# Patient Record
Sex: Female | Born: 1950 | Race: White | Hispanic: No | Smoking: Former smoker
Health system: Southern US, Community
[De-identification: ages and names within clinical notes are randomized; demographics above are authoritative.]

## PROBLEM LIST (undated history)

## (undated) DIAGNOSIS — I639 Cerebral infarction, unspecified: Secondary | ICD-10-CM

## (undated) DIAGNOSIS — K635 Polyp of colon: Secondary | ICD-10-CM

## (undated) DIAGNOSIS — I6529 Occlusion and stenosis of unspecified carotid artery: Secondary | ICD-10-CM

## (undated) DIAGNOSIS — I272 Pulmonary hypertension, unspecified: Secondary | ICD-10-CM

## (undated) DIAGNOSIS — N186 End stage renal disease: Secondary | ICD-10-CM

## (undated) DIAGNOSIS — J439 Emphysema, unspecified: Secondary | ICD-10-CM

## (undated) DIAGNOSIS — R260 Ataxic gait: Secondary | ICD-10-CM

## (undated) DIAGNOSIS — Z950 Presence of cardiac pacemaker: Secondary | ICD-10-CM

## (undated) DIAGNOSIS — D638 Anemia in other chronic diseases classified elsewhere: Secondary | ICD-10-CM

## (undated) DIAGNOSIS — T07XXXA Unspecified multiple injuries, initial encounter: Secondary | ICD-10-CM

## (undated) DIAGNOSIS — H409 Unspecified glaucoma: Secondary | ICD-10-CM

## (undated) DIAGNOSIS — G43909 Migraine, unspecified, not intractable, without status migrainosus: Secondary | ICD-10-CM

## (undated) DIAGNOSIS — Z992 Dependence on renal dialysis: Secondary | ICD-10-CM

## (undated) DIAGNOSIS — K219 Gastro-esophageal reflux disease without esophagitis: Secondary | ICD-10-CM

## (undated) DIAGNOSIS — I739 Peripheral vascular disease, unspecified: Secondary | ICD-10-CM

## (undated) DIAGNOSIS — H3500 Unspecified background retinopathy: Secondary | ICD-10-CM

## (undated) DIAGNOSIS — F40240 Claustrophobia: Secondary | ICD-10-CM

## (undated) DIAGNOSIS — E118 Type 2 diabetes mellitus with unspecified complications: Secondary | ICD-10-CM

## (undated) DIAGNOSIS — Z87442 Personal history of urinary calculi: Secondary | ICD-10-CM

## (undated) DIAGNOSIS — I10 Essential (primary) hypertension: Secondary | ICD-10-CM

## (undated) DIAGNOSIS — E785 Hyperlipidemia, unspecified: Secondary | ICD-10-CM

## (undated) DIAGNOSIS — R0601 Orthopnea: Secondary | ICD-10-CM

## (undated) DIAGNOSIS — R52 Pain, unspecified: Secondary | ICD-10-CM

## (undated) DIAGNOSIS — R0902 Hypoxemia: Secondary | ICD-10-CM

## (undated) DIAGNOSIS — I2510 Atherosclerotic heart disease of native coronary artery without angina pectoris: Secondary | ICD-10-CM

## (undated) HISTORY — DX: Cerebral infarction, unspecified: I63.9

## (undated) HISTORY — PX: AV FISTULA PLACEMENT: SHX1204

## (undated) HISTORY — DX: Essential (primary) hypertension: I10

## (undated) HISTORY — PX: ABDOMINAL HYSTERECTOMY: SHX81

## (undated) HISTORY — PX: TUBAL LIGATION: SHX77

## (undated) HISTORY — PX: OTHER SURGICAL HISTORY: SHX169

## (undated) HISTORY — PX: CHOLECYSTECTOMY: SHX55

## (undated) HISTORY — PX: PACEMAKER IMPLANT: EP1218

## (undated) HISTORY — DX: Migraine, unspecified, not intractable, without status migrainosus: G43.909

## (undated) HISTORY — PX: INSERT / REPLACE / REMOVE PACEMAKER: SUR710

## (undated) HISTORY — DX: Polyp of colon: K63.5

## (undated) HISTORY — DX: Hyperlipidemia, unspecified: E78.5

## (undated) HISTORY — DX: Emphysema, unspecified: J43.9

## (undated) HISTORY — PX: TONSILLECTOMY: SUR1361

## (undated) HISTORY — DX: Occlusion and stenosis of unspecified carotid artery: I65.29

## (undated) HISTORY — PX: CORONARY ARTERY BYPASS GRAFT: SHX141

## (undated) HISTORY — DX: Gastro-esophageal reflux disease without esophagitis: K21.9

---

## 2014-03-17 DIAGNOSIS — E1122 Type 2 diabetes mellitus with diabetic chronic kidney disease: Secondary | ICD-10-CM | POA: Insufficient documentation

## 2014-03-17 DIAGNOSIS — I10 Essential (primary) hypertension: Secondary | ICD-10-CM | POA: Insufficient documentation

## 2014-03-17 DIAGNOSIS — N184 Chronic kidney disease, stage 4 (severe): Secondary | ICD-10-CM | POA: Insufficient documentation

## 2014-03-17 DIAGNOSIS — N186 End stage renal disease: Secondary | ICD-10-CM

## 2014-07-12 DIAGNOSIS — E119 Type 2 diabetes mellitus without complications: Secondary | ICD-10-CM | POA: Insufficient documentation

## 2014-07-12 DIAGNOSIS — R0602 Shortness of breath: Secondary | ICD-10-CM | POA: Insufficient documentation

## 2014-07-12 DIAGNOSIS — R079 Chest pain, unspecified: Secondary | ICD-10-CM | POA: Insufficient documentation

## 2014-07-12 DIAGNOSIS — N289 Disorder of kidney and ureter, unspecified: Secondary | ICD-10-CM | POA: Insufficient documentation

## 2014-09-02 DIAGNOSIS — I10 Essential (primary) hypertension: Secondary | ICD-10-CM | POA: Insufficient documentation

## 2014-09-03 DIAGNOSIS — D638 Anemia in other chronic diseases classified elsewhere: Secondary | ICD-10-CM | POA: Insufficient documentation

## 2014-09-03 DIAGNOSIS — N186 End stage renal disease: Secondary | ICD-10-CM | POA: Insufficient documentation

## 2014-09-03 DIAGNOSIS — Z992 Dependence on renal dialysis: Principal | ICD-10-CM

## 2014-11-26 DIAGNOSIS — I6529 Occlusion and stenosis of unspecified carotid artery: Secondary | ICD-10-CM | POA: Insufficient documentation

## 2014-11-26 DIAGNOSIS — N186 End stage renal disease: Secondary | ICD-10-CM | POA: Insufficient documentation

## 2015-04-01 LAB — HM COLONOSCOPY

## 2015-06-24 LAB — HEMOGLOBIN A1C: Hemoglobin A1C: 8.3

## 2015-08-15 LAB — LIPID PANEL
Cholesterol: 188 mg/dL (ref 0–200)
HDL: 36 mg/dL (ref 35–70)
LDL Cholesterol: 84 mg/dL
Triglycerides: 340 mg/dL — AB (ref 40–160)

## 2015-08-29 DIAGNOSIS — I2510 Atherosclerotic heart disease of native coronary artery without angina pectoris: Secondary | ICD-10-CM | POA: Insufficient documentation

## 2015-09-09 DIAGNOSIS — I639 Cerebral infarction, unspecified: Secondary | ICD-10-CM

## 2015-09-09 HISTORY — DX: Cerebral infarction, unspecified: I63.9

## 2015-10-30 ENCOUNTER — Ambulatory Visit (INDEPENDENT_AMBULATORY_CARE_PROVIDER_SITE_OTHER): Payer: Medicaid Other | Admitting: Family Medicine

## 2015-10-30 ENCOUNTER — Encounter: Payer: Self-pay | Admitting: Family Medicine

## 2015-10-30 VITALS — BP 180/82 | HR 82 | Temp 98.7°F | Resp 16 | Ht 61.0 in | Wt 145.8 lb

## 2015-10-30 DIAGNOSIS — N185 Chronic kidney disease, stage 5: Secondary | ICD-10-CM

## 2015-10-30 DIAGNOSIS — Z992 Dependence on renal dialysis: Secondary | ICD-10-CM

## 2015-10-30 DIAGNOSIS — Z794 Long term (current) use of insulin: Secondary | ICD-10-CM

## 2015-10-30 DIAGNOSIS — I6930 Unspecified sequelae of cerebral infarction: Secondary | ICD-10-CM | POA: Diagnosis not present

## 2015-10-30 DIAGNOSIS — E785 Hyperlipidemia, unspecified: Secondary | ICD-10-CM

## 2015-10-30 DIAGNOSIS — E1122 Type 2 diabetes mellitus with diabetic chronic kidney disease: Secondary | ICD-10-CM

## 2015-10-30 DIAGNOSIS — I120 Hypertensive chronic kidney disease with stage 5 chronic kidney disease or end stage renal disease: Secondary | ICD-10-CM | POA: Diagnosis not present

## 2015-10-30 DIAGNOSIS — N186 End stage renal disease: Secondary | ICD-10-CM | POA: Insufficient documentation

## 2015-10-30 DIAGNOSIS — D638 Anemia in other chronic diseases classified elsewhere: Secondary | ICD-10-CM | POA: Diagnosis not present

## 2015-10-30 DIAGNOSIS — I2510 Atherosclerotic heart disease of native coronary artery without angina pectoris: Secondary | ICD-10-CM | POA: Diagnosis not present

## 2015-10-30 DIAGNOSIS — I6521 Occlusion and stenosis of right carotid artery: Secondary | ICD-10-CM | POA: Diagnosis not present

## 2015-10-30 DIAGNOSIS — I639 Cerebral infarction, unspecified: Secondary | ICD-10-CM

## 2015-10-30 DIAGNOSIS — I5022 Chronic systolic (congestive) heart failure: Secondary | ICD-10-CM

## 2015-10-30 DIAGNOSIS — I509 Heart failure, unspecified: Secondary | ICD-10-CM | POA: Insufficient documentation

## 2015-10-30 MED ORDER — BLOOD PRESSURE CUFF MISC: 1.0000 | Freq: Every day | Status: DC

## 2015-10-30 MED ORDER — LISINOPRIL 10 MG PO TABS: 10.0000 mg | ORAL_TABLET | Freq: Every day | ORAL | Status: DC

## 2015-10-30 MED ORDER — INSULIN GLARGINE 100 UNIT/ML SOLOSTAR PEN: 26.0000 [IU] | PEN_INJECTOR | Freq: Every day | SUBCUTANEOUS | Status: DC

## 2015-10-30 MED ORDER — ATORVASTATIN CALCIUM 20 MG PO TABS: 20.0000 mg | ORAL_TABLET | Freq: Every day | ORAL | Status: DC

## 2015-10-30 MED ORDER — CARVEDILOL 12.5 MG PO TABS: 12.5000 mg | ORAL_TABLET | Freq: Two times a day (BID) | ORAL | Status: DC

## 2015-10-30 MED ORDER — SEVELAMER CARBONATE 800 MG PO TABS: 1600.0000 mg | ORAL_TABLET | Freq: Three times a day (TID) | ORAL | Status: DC

## 2015-10-30 MED ORDER — AMLODIPINE BESYLATE 5 MG PO TABS: 5.0000 mg | ORAL_TABLET | Freq: Every day | ORAL | Status: DC

## 2015-10-30 NOTE — Progress Notes (Signed)
Subjective:    Patient ID: Nichole Hurst, female    DOB: 01/22/1951, 64 y.o.   MRN: [id number]  HPI: Nichole Hurst is a 64 y.o. female presenting on 10/30/2015 for Establish Care   HPI  Pt presents to establish care today. Previous care provider was [facility] in [place].  It has been a while  since Her last PCP visit. She sees both a Renal and Cardiac Specialist. Records from previous provider will be requested and reviewed. Current medical problems include:  Stroke in November: Treated at [facility] Has not followed up with neurology. R sided weakness. Has completed PT. No issues swallowing. R facial droop. Stroke was caused by blood in the brain. On Statin for secondary prevention. Kidney disease: on HD MWF. AV fistula. Kidney specialist- Dr. Cherylann Ratel? - gets [facility]. ESRD 2/2 diabetes and HTN.  High blood pressure: Diagnosed >10 years ago. Taking lisinopril and amlodipine for BP. Coreg BID. Pt has not taken medications for BP this AM. Does not check BP at home. Denies CP, SOB, visual changes, or HA Diabetes: Last A1c in August was 8.3% per care everywhere. Pt thinks her  Kidney specialist was monitoring her diabetes.  Taking 26 units of lantus at bedtime.  [facility] managed CHF. Unsure last EF. Taking coreg daily. Volume managed by nephrology Sees provider on 1/2 Iron Deficiency anemia- taking iron daily. 2/2chronic disease as well. Labs are drawn at dialysis.   Pt is poor historian. Unsure who is managing her chronic conditions.   Health maintenance:  Last May 2016- colon polyps. Due in 5 years.  Lipid panel- 08/2015 Unsure last pap.  Unsure last mammo.    Past Medical History  Diagnosis Date  . Diabetes mellitus without complication (HCC)   . Hypertension   . Renal failure   . Hyperlipidemia   . Anemia   . Colon polyp   . GERD (gastroesophageal reflux disease)   . Emphysema/COPD (HCC)   . Migraine   . Stroke (HCC) 09/2015    Ischemic stroke with R  sided hemiparesis  . Carotid stenosis   . CHF (congestive heart failure) [facility])    Past Surgical History  Procedure Laterality Date  . Cholecystectomy    . Tonsilectomy, adenoidectomy, bilateral myringotomy and tubes      Social History   Social History  . Marital Status: Widowed    Spouse Name: N/A  . Number of Children: N/A  . Years of Education: N/A   Occupational History  . Not on file.   Social History Main Topics  . Smoking status: Former Games developer  . Smokeless tobacco: Former Neurosurgeon  . Alcohol Use: No  . Drug Use: No  . Sexual Activity: Not on file   Other Topics Concern  . Not on file   Social History Narrative  . No narrative on file   Family History  Problem Relation Age of Onset  . Diabetes Mother    No current outpatient prescriptions on file prior to visit.   No current facility-administered medications on file prior to visit.    Review of Systems  Constitutional: Negative for fever and chills.  HENT: Negative.   Respiratory: Negative for cough, chest tightness and wheezing.   Cardiovascular: Negative for chest pain and leg swelling.  Gastrointestinal: Negative for nausea, vomiting, abdominal pain, diarrhea and constipation.  Endocrine: Negative.  Negative for cold intolerance, heat intolerance, polydipsia, polyphagia and polyuria.  Genitourinary: Negative for dysuria and difficulty urinating.  Musculoskeletal: Negative.   Neurological: Positive  for facial asymmetry (r side residual from stroke. ) and weakness (R side residual from stroke). Negative for dizziness, light-headedness and numbness.  Psychiatric/Behavioral: Negative.    Per HPI unless specifically indicated above     Objective:    BP 180/82 mmHg  Pulse 82  Temp(Src) 98.7 F (37.1 C) (Oral)  Resp 16  Ht 5\' 1"  (1.549 m)  Wt 145 lb 12.8 oz (66.134 kg)  BMI 27.56 kg/m2  Wt Readings from Last 3 Encounters:  10/30/15 145 lb 12.8 oz (66.134 kg)    Physical Exam  Constitutional: She  is oriented to person, place, and time. She appears well-developed and well-nourished.  HENT:  Head: Normocephalic and atraumatic.  Neck: Neck supple.  Cardiovascular: Normal rate and regular rhythm.  Exam reveals no gallop and no friction rub.   Murmur heard.  Systolic murmur is present with a grade of 2/6  2/6 murmur heard best RSB- does not radiate.   Pulmonary/Chest: Effort normal and breath sounds normal. She has no wheezes. She exhibits no tenderness.  Abdominal: Soft. Normal appearance and bowel sounds are normal. She exhibits no distension and no mass. There is no tenderness. There is no rebound and no guarding.  Musculoskeletal: Normal range of motion. She exhibits no edema or tenderness.  Lymphadenopathy:    She has no cervical adenopathy.  Neurological: She is alert and oriented to person, place, and time. She has normal reflexes. No cranial nerve deficit or sensory deficit. She displays a negative Romberg sign.  R sided strength 4/5 2/2 stroke. Facial assymetry R side 2/2 stroke  Skin: Skin is warm and dry.   Results for orders placed or performed in visit on 10/30/15  Lipid panel  Result Value Ref Range   Triglycerides 340 (A) 40 - 160 mg/dL   Cholesterol 161 0 - 096 mg/dL   HDL 36 35 - 70 mg/dL   LDL Cholesterol 84 mg/dL  Hemoglobin E4V  Result Value Ref Range   Hemoglobin A1C 8.3   HM COLONOSCOPY  Result Value Ref Range   HM Colonoscopy polyps       Assessment & Plan:   Problem List Items Addressed This Visit      Cardiovascular and Mediastinum   Carotid artery narrowing    Found on carotid duplex post stroke. 60% stenosis in  R internal carotid. Will hold off on vascular referral at this time pending neuro consult.  Plan for carotid dopplers in 6 mos.       Relevant Medications   aspirin EC 81 MG tablet   lisinopril (PRINIVIL,ZESTRIL) 10 MG tablet   amLODipine (NORVASC) 5 MG tablet   atorvastatin (LIPITOR) 20 MG tablet   carvedilol (COREG) 12.5 MG tablet    CAD in native artery    S/P PCI in October 2016. Managed by [facility] cardiology.       Relevant Medications   aspirin EC 81 MG tablet   lisinopril (PRINIVIL,ZESTRIL) 10 MG tablet   amLODipine (NORVASC) 5 MG tablet   atorvastatin (LIPITOR) 20 MG tablet   carvedilol (COREG) 12.5 MG tablet   BP (high blood pressure)    BP very elevated. Pt reports she is not taken BP meds today. Recheck after medication taken. Pt will call tomorrow if BP is still elevated in dialysis.  Consider increasing amlodipine to control BP.  Pt encouraged to check BP at home. Prescription for BP cuff given.        Relevant Medications   aspirin EC 81 MG tablet  lisinopril (PRINIVIL,ZESTRIL) 10 MG tablet   amLODipine (NORVASC) 5 MG tablet   atorvastatin (LIPITOR) 20 MG tablet   carvedilol (COREG) 12.5 MG tablet   Blood Pressure Monitoring (BLOOD PRESSURE CUFF) MISC   CHF (congestive heart failure) (HCC)    Managed by [facility] cardiology. Coreg, statin, and aspirin being taken. Last EF 55%.       Relevant Medications   aspirin EC 81 MG tablet   lisinopril (PRINIVIL,ZESTRIL) 10 MG tablet   amLODipine (NORVASC) 5 MG tablet   atorvastatin (LIPITOR) 20 MG tablet   carvedilol (COREG) 12.5 MG tablet     Endocrine   Type 2 diabetes mellitus with end-stage renal disease (HCC)    Ordered A1c for dialysis to complete. Pt only taking lantus at bedtime. Last documented A1c was 8.3%.  Unsure of eye exam. Anuric- so no microalbumin.  Foot exam deferred for next visit.       Relevant Medications   aspirin EC 81 MG tablet   lisinopril (PRINIVIL,ZESTRIL) 10 MG tablet   atorvastatin (LIPITOR) 20 MG tablet   Insulin Glargine (LANTUS SOLOSTAR) 100 UNIT/ML Solostar Pen     Genitourinary   End stage renal failure on dialysis (HCC) - Primary    HD MWF. Managed by nephrology. Requesting labs from her dialysis center to monitor Cr and GFR.      Relevant Medications   sevelamer carbonate (RENVELA) 800 MG tablet     Other     Anemia of chronic disease    Requesting labs from dialysis to monitor anemia.  Continue iron.       Relevant Medications   ferrous sulfate 325 (65 FE) MG tablet   Ferrous Sulfate 140 (45 FE) MG TBCR   History of stroke with residual deficit    Refer to neurology for follow-up. Pt is completing rehab. Statin and aspirin for secondary prevention.  .       Hyperlipidemia    Continue statin.       Relevant Medications   aspirin EC 81 MG tablet   lisinopril (PRINIVIL,ZESTRIL) 10 MG tablet   amLODipine (NORVASC) 5 MG tablet   atorvastatin (LIPITOR) 20 MG tablet   carvedilol (COREG) 12.5 MG tablet      Meds ordered this encounter  Medications  . calcium acetate (PHOSLO) 667 MG capsule    Sig: Take 1,334 mg by mouth.  . ferrous sulfate 325 (65 FE) MG tablet    Sig: Take 325 mg by mouth.  . DISCONTD: Insulin Glargine (LANTUS SOLOSTAR) 100 UNIT/ML Solostar Pen    Sig: Inject into the skin.  Marland Kitchen acetaminophen (TYLENOL) 325 MG tablet    Sig: Take by mouth.  Marland Kitchen albuterol-ipratropium (COMBIVENT) 18-103 MCG/ACT inhaler    Sig: Inhale into the lungs.  Marland Kitchen DISCONTD: amLODipine (NORVASC) 5 MG tablet    Sig: Take by mouth.  Marland Kitchen aspirin EC 81 MG tablet    Sig: Take by mouth.  . DISCONTD: atorvastatin (LIPITOR) 20 MG tablet    Sig: Take by mouth.  . DISCONTD: carvedilol (COREG) 12.5 MG tablet    Sig: Take by mouth.  . Ferrous Sulfate 140 (45 FE) MG TBCR    Sig: 140 mg daily.   Marland Kitchen DISCONTD: insulin lispro (HUMALOG) 100 UNIT/ML injection    Sig: Inject into the skin.  Marland Kitchen DISCONTD: insulin lispro (HUMALOG) 100 UNIT/ML injection    Sig: Inject into the skin.  Marland Kitchen DISCONTD: lisinopril (PRINIVIL,ZESTRIL) 10 MG tablet    Sig: Take by mouth.  . pantoprazole (PROTONIX)  40 MG tablet    Sig: Take by mouth.  . DISCONTD: sevelamer carbonate (RENVELA) 800 MG tablet    Sig: Take by mouth.  Marland Kitchen. lisinopril (PRINIVIL,ZESTRIL) 10 MG tablet    Sig: Take 1 tablet (10 mg total) by mouth daily.    Dispense:   90 tablet    Refill:  3    Order Specific Question:  Supervising Provider    Answer:  Janeann ForehandHAWKINS JR, JAMES H [phone][[id number]]  . amLODipine (NORVASC) 5 MG tablet    Sig: Take 1 tablet (5 mg total) by mouth daily.    Dispense:  90 tablet    Refill:  3    Order Specific Question:  Supervising Provider    Answer:  Janeann ForehandHAWKINS JR, JAMES H [phone][[id number]]  . atorvastatin (LIPITOR) 20 MG tablet    Sig: Take 1 tablet (20 mg total) by mouth daily at 6 PM.    Dispense:  90 tablet    Refill:  3    Order Specific Question:  Supervising Provider    Answer:  Janeann ForehandHAWKINS JR, JAMES H [phone][[id number]]  . Insulin Glargine (LANTUS SOLOSTAR) 100 UNIT/ML Solostar Pen    Sig: Inject 26 Units into the skin at bedtime.    Dispense:  15 mL    Refill:  11    Order Specific Question:  Supervising Provider    Answer:  Janeann ForehandHAWKINS JR, JAMES H [phone][[id number]]  . sevelamer carbonate (RENVELA) 800 MG tablet    Sig: Take 2 tablets (1,600 mg total) by mouth 3 (three) times daily with meals.    Dispense:  270 tablet    Refill:  3    Order Specific Question:  Supervising Provider    Answer:  Janeann ForehandHAWKINS JR, JAMES H [phone][[id number]]  . carvedilol (COREG) 12.5 MG tablet    Sig: Take 1 tablet (12.5 mg total) by mouth 2 (two) times daily with a meal.    Dispense:  180 tablet    Refill:  3    Order Specific Question:  Supervising Provider    Answer:  Janeann ForehandHAWKINS JR, JAMES H [phone][[id number]]  . Blood Pressure Monitoring (BLOOD PRESSURE CUFF) MISC    Sig: 1 each by Does not apply route daily.    Dispense:  1 each    Refill:  0    Order Specific Question:  Supervising Provider    Answer:  Janeann ForehandHAWKINS JR, JAMES H [[phone]]      Follow up plan: Return in about 1 week (around 11/06/2015) for BP check.

## 2015-10-30 NOTE — Patient Instructions (Signed)
Please seek immediate medical attention at ER or Urgent Care if you develop: Chest pain, pressure or tightness. Shortness of breath accompanied by nausea or diaphoresis Visual changes Numbness or tingling on one side of the body Facial droop Altered mental status Or any concerning symptoms.   Your goal blood pressure is 140/90. Work on low salt/sodium diet - goal <2.5gm (2,500mg ) per day. Eat a diet high in fruits/vegetables and whole grains.  Look into mediterranean and DASH diet. Goal activity is 16950min/wk of moderate intensity exercise.  This can be split into 30 minute chunks.  If you are not at this level, you can start with smaller 10-15 min increments and slowly build up activity. Look at www.heart.org for more resources   Please call me if you BP remains elevated tomorrow. We need to ensure it's going back down.

## 2015-10-30 NOTE — Assessment & Plan Note (Signed)
Found on carotid duplex post stroke. 60% stenosis in  R internal carotid. Will hold off on vascular referral at this time pending neuro consult.  Plan for carotid dopplers in 6 mos.

## 2015-10-30 NOTE — Assessment & Plan Note (Signed)
Managed by [facility] cardiology. Coreg, statin, and aspirin being taken. Last EF 55%.

## 2015-10-30 NOTE — Assessment & Plan Note (Addendum)
Refer to neurology for follow-up. Pt is completing rehab. Statin and aspirin for secondary prevention.  .Marland Kitchen

## 2015-10-30 NOTE — Assessment & Plan Note (Signed)
Continue statin. 

## 2015-10-30 NOTE — Assessment & Plan Note (Signed)
BP very elevated. Pt reports she is not taken BP meds today. Recheck after medication taken. Pt will call tomorrow if BP is still elevated in dialysis.  Consider increasing amlodipine to control BP.  Pt encouraged to check BP at home. Prescription for BP cuff given.

## 2015-10-30 NOTE — Assessment & Plan Note (Signed)
S/P PCI in October 2016. [facility].

## 2015-10-30 NOTE — Assessment & Plan Note (Signed)
HD MWF. Managed by nephrology. Requesting labs from her dialysis center to monitor Cr and GFR.

## 2015-10-30 NOTE — Assessment & Plan Note (Signed)
Ordered A1c for dialysis to complete. Pt only taking lantus at bedtime. Last documented A1c was 8.3%.  Unsure of eye exam. Anuric- so no microalbumin.  Foot exam deferred for next visit.

## 2015-10-30 NOTE — Assessment & Plan Note (Signed)
Requesting labs from dialysis to monitor anemia.  Continue iron.

## 2015-11-06 ENCOUNTER — Encounter: Payer: Self-pay | Admitting: *Deleted

## 2015-12-30 ENCOUNTER — Other Ambulatory Visit: Payer: Self-pay | Admitting: Family

## 2015-12-30 DIAGNOSIS — M5489 Other dorsalgia: Secondary | ICD-10-CM

## 2016-01-01 ENCOUNTER — Ambulatory Visit
Admission: RE | Admit: 2016-01-01 | Discharge: 2016-01-01 | Disposition: A | Discharge status: Home / Self Care | Payer: Medicare (Managed Care) | Source: Ambulatory Visit | Attending: Family | Admitting: Family

## 2016-01-01 DIAGNOSIS — M549 Dorsalgia, unspecified: Secondary | ICD-10-CM | POA: Diagnosis not present

## 2016-01-01 DIAGNOSIS — K5730 Diverticulosis of large intestine without perforation or abscess without bleeding: Secondary | ICD-10-CM | POA: Diagnosis not present

## 2016-01-01 DIAGNOSIS — R319 Hematuria, unspecified: Secondary | ICD-10-CM | POA: Diagnosis not present

## 2016-01-01 DIAGNOSIS — Z87442 Personal history of urinary calculi: Secondary | ICD-10-CM | POA: Insufficient documentation

## 2016-01-01 DIAGNOSIS — I700 Atherosclerosis of aorta: Secondary | ICD-10-CM | POA: Insufficient documentation

## 2016-01-01 DIAGNOSIS — M5489 Other dorsalgia: Secondary | ICD-10-CM | POA: Diagnosis present

## 2016-02-14 ENCOUNTER — Encounter: Payer: Self-pay | Admitting: *Deleted

## 2016-02-14 ENCOUNTER — Emergency Department
Admission: EM | Admit: 2016-02-14 | Discharge: 2016-02-15 | Disposition: A | Discharge status: Home / Self Care | Payer: Medicare (Managed Care) | Attending: Emergency Medicine | Admitting: Emergency Medicine

## 2016-02-14 DIAGNOSIS — S46912A Strain of unspecified muscle, fascia and tendon at shoulder and upper arm level, left arm, initial encounter: Secondary | ICD-10-CM

## 2016-02-14 DIAGNOSIS — E119 Type 2 diabetes mellitus without complications: Secondary | ICD-10-CM | POA: Diagnosis not present

## 2016-02-14 DIAGNOSIS — Y999 Unspecified external cause status: Secondary | ICD-10-CM | POA: Diagnosis not present

## 2016-02-14 DIAGNOSIS — S46812A Strain of other muscles, fascia and tendons at shoulder and upper arm level, left arm, initial encounter: Secondary | ICD-10-CM | POA: Diagnosis not present

## 2016-02-14 DIAGNOSIS — S4992XA Unspecified injury of left shoulder and upper arm, initial encounter: Secondary | ICD-10-CM | POA: Diagnosis present

## 2016-02-14 DIAGNOSIS — Z8673 Personal history of transient ischemic attack (TIA), and cerebral infarction without residual deficits: Secondary | ICD-10-CM | POA: Diagnosis not present

## 2016-02-14 DIAGNOSIS — I110 Hypertensive heart disease with heart failure: Secondary | ICD-10-CM | POA: Diagnosis not present

## 2016-02-14 DIAGNOSIS — Z79899 Other long term (current) drug therapy: Secondary | ICD-10-CM | POA: Insufficient documentation

## 2016-02-14 DIAGNOSIS — Y929 Unspecified place or not applicable: Secondary | ICD-10-CM | POA: Diagnosis not present

## 2016-02-14 DIAGNOSIS — M25512 Pain in left shoulder: Secondary | ICD-10-CM

## 2016-02-14 DIAGNOSIS — E785 Hyperlipidemia, unspecified: Secondary | ICD-10-CM | POA: Insufficient documentation

## 2016-02-14 DIAGNOSIS — Y939 Activity, unspecified: Secondary | ICD-10-CM | POA: Insufficient documentation

## 2016-02-14 DIAGNOSIS — X58XXXA Exposure to other specified factors, initial encounter: Secondary | ICD-10-CM | POA: Insufficient documentation

## 2016-02-14 DIAGNOSIS — Z87891 Personal history of nicotine dependence: Secondary | ICD-10-CM | POA: Insufficient documentation

## 2016-02-14 DIAGNOSIS — Z794 Long term (current) use of insulin: Secondary | ICD-10-CM | POA: Diagnosis not present

## 2016-02-14 DIAGNOSIS — I509 Heart failure, unspecified: Secondary | ICD-10-CM | POA: Insufficient documentation

## 2016-02-14 DIAGNOSIS — Z7982 Long term (current) use of aspirin: Secondary | ICD-10-CM | POA: Diagnosis not present

## 2016-02-14 LAB — BASIC METABOLIC PANEL
ANION GAP: 16 — AB (ref 5–15)
BUN: 35 mg/dL — ABNORMAL HIGH (ref 6–20)
CALCIUM: 9.1 mg/dL (ref 8.9–10.3)
CO2: 25 mmol/L (ref 22–32)
Chloride: 91 mmol/L — ABNORMAL LOW (ref 101–111)
Creatinine, Ser: 7.36 mg/dL — ABNORMAL HIGH (ref 0.44–1.00)
GFR, EST AFRICAN AMERICAN: 6 mL/min — AB (ref >60)
GFR, EST NON AFRICAN AMERICAN: 5 mL/min — AB (ref >60)
GLUCOSE: 227 mg/dL — AB (ref 65–99)
POTASSIUM: 4.1 mmol/L (ref 3.5–5.1)
SODIUM: 132 mmol/L — AB (ref 135–145)

## 2016-02-14 LAB — TROPONIN I

## 2016-02-14 LAB — CBC
HEMATOCRIT: 35.7 % (ref 35.0–47.0)
HEMOGLOBIN: 11.7 g/dL — AB (ref 12.0–16.0)
MCH: 30.1 pg (ref 26.0–34.0)
MCHC: 32.7 g/dL (ref 32.0–36.0)
MCV: 92 fL (ref 80.0–100.0)
Platelets: 293 10*3/uL (ref 150–440)
RBC: 3.88 MIL/uL (ref 3.80–5.20)
RDW: 14 % (ref 11.5–14.5)
WBC: 11.8 10*3/uL — AB (ref 3.6–11.0)

## 2016-02-14 NOTE — ED Notes (Signed)
Pt c/o L shoulder pain radiating to neck that started on past Wed early in morning, woke her from sleep. Pt states worsening pain and is very uncomfortable in triage. Pt denies dyspnea. Pt denies pain reproducible.

## 2016-02-15 ENCOUNTER — Emergency Department: Payer: Medicare (Managed Care)

## 2016-02-15 MED ORDER — HYDROCODONE-ACETAMINOPHEN 5-325 MG PO TABS: 1.0000 | ORAL_TABLET | ORAL | Status: DC | PRN

## 2016-02-15 MED ORDER — DOCUSATE SODIUM 100 MG PO CAPS: ORAL_CAPSULE | ORAL | Status: DC

## 2016-02-15 MED ORDER — OXYCODONE-ACETAMINOPHEN 5-325 MG PO TABS
2.0000 | ORAL_TABLET | Freq: Once | ORAL | Status: AC
  Administered 2016-02-15: 2 via ORAL
  Filled 2016-02-15: qty 2

## 2016-02-15 NOTE — ED Provider Notes (Signed)
[facility] Emergency Department Provider Note  ____________________________________________  Time seen: Approximately 12:43 AM  I have reviewed the triage vital signs and the nursing notes.   HISTORY  Chief Complaint Shoulder Pain    HPI Giavonna Pflum is a 65 y.o. female with multiple chronic medical conditions including coronary artery disease who is being worked up for a CABG at [facility], diabetes, hypertension, chronic renal failure on hemodialysis Monday Wednesday and Friday, and carotid stenosis who presents with acute onset 3 days ago of severe left upper extremity pain.  She reports that it starts in her upper arm and radiates into her left shoulder and the top of her left upper back and left neck. She reports it is much worse when she pushes on the area and when she moves her arm around.  Nothing makes it better.  She has not seen her doctor or talk to anyone else about it.  She had her normal course of dialysis yesterday.  She denies fever/chills, chest pain, shortness of breath, abdominal pain, nausea/vomiting.  She has not had similar pain in the past.  She is not aware of any traumatic injury.  She has been lifting her grandchild recently but does not think this is likely to have caused it.   Past Medical History  Diagnosis Date  . Diabetes mellitus without complication (HCC)   . Hypertension   . Renal failure   . Hyperlipidemia   . Anemia   . Colon polyp   . GERD (gastroesophageal reflux disease)   . Emphysema/COPD (HCC)   . Migraine   . Stroke (HCC) 09/2015    Ischemic stroke with R sided hemiparesis  . Carotid stenosis   . CHF (congestive heart failure) [facility]     Patient Active Problem List   Diagnosis Date Noted  . CHF (congestive heart failure) (HCC) 10/30/2015  . History of stroke with residual deficit 10/30/2015  . Hyperlipidemia 10/30/2015  . Cerebrovascular accident (CVA) (HCC) 09/09/2015  . CAD in native artery 08/29/2015  . Carotid  artery narrowing 11/26/2014  . Anemia of chronic disease 09/03/2014  . End stage renal failure on dialysis (HCC) 09/03/2014  . Chest pain 07/12/2014  . Breath shortness 07/12/2014  . Type 2 diabetes mellitus with end-stage renal disease (HCC) 03/17/2014  . BP (high blood pressure) 03/17/2014    Past Surgical History  Procedure Laterality Date  . Cholecystectomy    . Tonsilectomy, adenoidectomy, bilateral myringotomy and tubes      Current Outpatient Rx  Name  Route  Sig  Dispense  Refill  . acetaminophen (TYLENOL) 325 MG tablet   Oral   Take by mouth.         Marland Kitchen albuterol-ipratropium (COMBIVENT) 18-103 MCG/ACT inhaler   Inhalation   Inhale into the lungs.         Marland Kitchen amLODipine (NORVASC) 5 MG tablet   Oral   Take 1 tablet (5 mg total) by mouth daily.   90 tablet   3   . aspirin EC 81 MG tablet   Oral   Take by mouth.         Marland Kitchen atorvastatin (LIPITOR) 20 MG tablet   Oral   Take 1 tablet (20 mg total) by mouth daily at 6 PM.   90 tablet   3   . Blood Pressure Monitoring (BLOOD PRESSURE CUFF) MISC   Does not apply   1 each by Does not apply route daily.   1 each   0   .  calcium acetate (PHOSLO) 667 MG capsule   Oral   Take 1,334 mg by mouth.         . carvedilol (COREG) 12.5 MG tablet   Oral   Take 1 tablet (12.5 mg total) by mouth 2 (two) times daily with a meal.   180 tablet   3   . docusate sodium (COLACE) 100 MG capsule      Take 1 tablet once or twice daily as needed for constipation while taking narcotic pain medicine   30 capsule   0   . Ferrous Sulfate 140 (45 FE) MG TBCR      140 mg daily.          . ferrous sulfate 325 (65 FE) MG tablet   Oral   Take 325 mg by mouth.         Marland Kitchen. HYDROcodone-acetaminophen (NORCO/VICODIN) 5-325 MG tablet   Oral   Take 1-2 tablets by mouth every 4 (four) hours as needed for moderate pain.   15 tablet   0   . Insulin Glargine (LANTUS SOLOSTAR) 100 UNIT/ML Solostar Pen   Subcutaneous   Inject 26  Units into the skin at bedtime.   15 mL   11   . lisinopril (PRINIVIL,ZESTRIL) 10 MG tablet   Oral   Take 1 tablet (10 mg total) by mouth daily.   90 tablet   3   . pantoprazole (PROTONIX) 40 MG tablet   Oral   Take by mouth.         . sevelamer carbonate (RENVELA) 800 MG tablet   Oral   Take 2 tablets (1,600 mg total) by mouth 3 (three) times daily with meals.   270 tablet   3     Allergies Review of patient's allergies indicates no known allergies.  Family History  Problem Relation Age of Onset  . Diabetes Mother     Social History Social History  Substance Use Topics  . Smoking status: Former Games developermoker  . Smokeless tobacco: Former NeurosurgeonUser  . Alcohol Use: No    Review of Systems Constitutional: No fever/chills Eyes: No visual changes. ENT: No sore throat. Cardiovascular: Denies chest pain. Respiratory: Denies shortness of breath. Gastrointestinal: No abdominal pain.  No nausea, no vomiting.  No diarrhea.  No constipation. Genitourinary: Negative for dysuria. Musculoskeletal: Pain in left upper extremity radiating from the upper arm to the left shoulder, left upper back, and neck Skin: Negative for rash. Neurological: Negative for headaches, focal weakness or numbness.  10-point ROS otherwise negative.  ____________________________________________   PHYSICAL EXAM:  VITAL SIGNS: ED Triage Vitals  Enc Vitals Group     BP 02/14/16 2300 186/71 mmHg     Pulse Rate 02/14/16 2300 84     Resp 02/14/16 2300 24     Temp 02/14/16 2300 98 F (36.7 C)     Temp Source 02/14/16 2300 Oral     SpO2 02/14/16 2300 96 %     Weight 02/14/16 2300 148 lb 13 oz (67.5 kg)     Height 02/14/16 2300 5\' 2"  (1.575 m)     Head Cir --      Peak Flow --      Pain Score 02/14/16 2301 9     Pain Loc --      Pain Edu? --      Excl. in GC? --     Constitutional: Alert and oriented.  Appears uncomfortable and is rubbing her arm. Eyes: Conjunctivae are normal. PERRL. EOMI.  Head:  Atraumatic. Nose: No congestion/rhinnorhea. Mouth/Throat: Mucous membranes are moist.  Oropharynx non-erythematous. Neck: No stridor.  No meningeal signs.   Cardiovascular: Normal rate, regular rhythm. Good peripheral circulation. Grossly normal heart sounds.   Respiratory: Normal respiratory effort.  No retractions. Lungs CTAB. Gastrointestinal: Soft and nontender. No distention.  Musculoskeletal: Dialysis fistula is present in the left upper extremity and has an easily palpable thrill with no obvious deformities or abnormalities.  The rest of her left upper extremity is normal in appearance with no deformities.  She is severely tender to palpation of the upper bicep, deltoid, AC joint, and essentially all of her left shoulder.  She is also tender to palpation along the muscles of her left upper back and neck with no obvious abnormalities on visual inspection. Neurologic:  Normal speech and language. No gross focal neurologic deficits are appreciated.  Skin:  Skin is warm, dry and intact. No rash noted. Psychiatric: Mood and affect are normal. Speech and behavior are normal.  ____________________________________________   LABS (all labs ordered are listed, but only abnormal results are displayed)  Labs Reviewed  BASIC METABOLIC PANEL - Abnormal; Notable for the following:    Sodium 132 (*)    Chloride 91 (*)    Glucose, Bld 227 (*)    BUN 35 (*)    Creatinine, Ser 7.36 (*)    GFR calc non Af Amer 5 (*)    GFR calc Af Amer 6 (*)    Anion gap 16 (*)    All other components within normal limits  CBC - Abnormal; Notable for the following:    WBC 11.8 (*)    Hemoglobin 11.7 (*)    All other components within normal limits  TROPONIN I   ____________________________________________  EKG  ED ECG REPORT I, Mister Krahenbuhl, the attending physician, personally viewed and interpreted this ECG.   Date: 02/14/2016  EKG Time: 23:14  Rate: 90  Rhythm: normal sinus rhythm  Axis: Normal   Intervals:Normal  ST&T Change: Artifact is present with some baseline wander, but there is no clear evidence of acute ischemia.  ____________________________________________  RADIOLOGY   Dg Shoulder Left  02/15/2016  CLINICAL DATA:  Severe right shoulder pain. Painful range of motion. EXAM: LEFT SHOULDER - 2+ VIEW COMPARISON:  None. FINDINGS: No fracture or dislocation. The alignment and joint spaces are maintained. Mild proliferative change at the acromioclavicular joint. No erosions or bony destructive change. No abnormal soft tissue calcifications. IMPRESSION: Mild acromioclavicular joint hypertrophy. No acute bony abnormality. Electronically Signed   By: Rubye Oaks M.D.   On: 02/15/2016 01:16    ____________________________________________   PROCEDURES  Procedure(s) performed: None  Critical Care performed: No ____________________________________________   INITIAL IMPRESSION / ASSESSMENT AND PLAN / ED COURSE  Pertinent labs & imaging results that were available during my care of the patient were reviewed by me and considered in my medical decision making (see chart for details).  The patient's physical exam strongly suggests musculoskeletal strain.  I will obtain x-rays and provide 2 Percocet for pain relief.  I strongly doubt vascular causes such as DVT, subclavian steal, etc. as her clinical presentation is not consistent with any of these more unusual etiologies.  I anticipate outpatient follow-up with pain control.  ----------------------------------------- 2:42 AM on 02/15/2016 -----------------------------------------  The patient reports that her pain is better after the Percocets although she can still feel it, but now it is mild.  She continues to rub at the muscles and I believe  this is a musculoskeletal issue.  I reiterated all of this with the patient and she agrees.  I will give her a short course of pain medicine and I gave her my usual and customary  management recommendations and return precautions.  I also gave her the name and number of orthopedics with whom she can follow-up if she continues to have trouble with that shoulder.  ____________________________________________  FINAL CLINICAL IMPRESSION(S) / ED DIAGNOSES  Final diagnoses:  Left shoulder pain  Muscle strain of shoulder region, left, initial encounter      NEW MEDICATIONS STARTED DURING THIS VISIT:  New Prescriptions   DOCUSATE SODIUM (COLACE) 100 MG CAPSULE    Take 1 tablet once or twice daily as needed for constipation while taking narcotic pain medicine   HYDROCODONE-ACETAMINOPHEN (NORCO/VICODIN) 5-325 MG TABLET    Take 1-2 tablets by mouth every 4 (four) hours as needed for moderate pain.      Note:  This document was prepared using Dragon voice recognition software and may include unintentional dictation errors.   Loleta Rose, MD 02/15/16 [phone]

## 2016-02-15 NOTE — ED Notes (Signed)
Pt is calling son for a ride because she drove herself here but has received two percocet while here. Will go over discharge instructions once son gets here.

## 2016-02-15 NOTE — ED Notes (Signed)
Discharge instructions reviewed with patient. Patient verbalized understanding. Patient ambulated to lobby without difficulty.   

## 2016-02-15 NOTE — Discharge Instructions (Signed)
As we discussed, we believe that your pain in the shoulder is most likely musculoskeletal strain given how tender it is to the touch and to moving the shoulder around.  Your x-rays were normal.  We recommend you read through the included discharge instructions for additional recommendations, use over-the-counter pain medication as needed, and follow-up with orthopedics in about one week.  Take Norco as prescribed for severe pain. Do not drink alcohol, drive or participate in any other potentially dangerous activities while taking this medication as it may make you sleepy. Do not take this medication with any other sedating medications, either prescription or over-the-counter. If you were prescribed Percocet or Vicodin, do not take these with acetaminophen (Tylenol) as it is already contained within these medications.   This medication is an opiate (or narcotic) pain medication and can be habit forming.  Use it as little as possible to achieve adequate pain control.  Do not use or use it with extreme caution if you have a history of opiate abuse or dependence.  If you are on a pain contract with your primary care doctor or a pain specialist, be sure to let them know you were prescribed this medication today from the [facility].  This medication is intended for your use only - do not give any to anyone else and keep it in a secure place where nobody else, especially children, have access to it.  It will also cause or worsen constipation, so you may want to consider taking an over-the-counter stool softener while you are taking this medication.   Shoulder Pain The shoulder is the joint that connects your arms to your body. The bones that form the shoulder joint include the upper arm bone (humerus), the shoulder blade (scapula), and the collarbone (clavicle). The top of the humerus is shaped like a ball and fits into a rather flat socket on the scapula (glenoid cavity). A combination  of muscles and strong, fibrous tissues that connect muscles to bones (tendons) support your shoulder joint and hold the ball in the socket. Small, fluid-filled sacs (bursae) are located in different areas of the joint. They act as cushions between the bones and the overlying soft tissues and help reduce friction between the gliding tendons and the bone as you move your arm. Your shoulder joint allows a wide range of motion in your arm. This range of motion allows you to do things like scratch your back or throw a ball. However, this range of motion also makes your shoulder more prone to pain from overuse and injury. Causes of shoulder pain can originate from both injury and overuse and usually can be grouped in the following four categories:  Redness, swelling, and pain (inflammation) of the tendon (tendinitis) or the bursae (bursitis).  Instability, such as a dislocation of the joint.  Inflammation of the joint (arthritis).  Broken bone (fracture). HOME CARE INSTRUCTIONS   Apply ice to the sore area.  Put ice in a plastic bag.  Place a towel between your skin and the bag.  Leave the ice on for 15-20 minutes, 3-4 times per day for the first 2 days, or as directed by your health care provider.  Stop using cold packs if they do not help with the pain.  If you have a shoulder sling or immobilizer, wear it as long as your caregiver instructs. Only remove it to shower or bathe. Move your arm as little as possible, but keep your hand moving to prevent swelling.  Squeeze a soft ball or foam pad as much as possible to help prevent swelling.  Only take over-the-counter or prescription medicines for pain, discomfort, or fever as directed by your caregiver. SEEK MEDICAL CARE IF:   Your shoulder pain increases, or new pain develops in your arm, hand, or fingers.  Your hand or fingers become cold and numb.  Your pain is not relieved with medicines. SEEK IMMEDIATE MEDICAL CARE IF:   Your arm,  hand, or fingers are numb or tingling.  Your arm, hand, or fingers are significantly swollen or turn white or blue. MAKE SURE YOU:   Understand these instructions.  Will watch your condition.  Will get help right away if you are not doing well or get worse.   This information is not intended to replace advice given to you by your health care provider. Make sure you discuss any questions you have with your health care provider.   Document Released: 08/04/2005 Document Revised: 11/15/2014 Document Reviewed: 02/17/2015 Elsevier Interactive Patient Education 2016 [employer]  [employer] Therapy Heat therapy can help ease sore, stiff, injured, and tight muscles and joints. Heat relaxes your muscles, which may help ease your pain.  RISKS AND COMPLICATIONS If you have any of the following conditions, do not use heat therapy unless your health care provider has approved:  Poor circulation.  Healing wounds or scarred skin in the area being treated.  Diabetes, heart disease, or high blood pressure.  Not being able to feel (numbness) the area being treated.  Unusual swelling of the area being treated.  Active infections.  Blood clots.  Cancer.  Inability to communicate pain. This may include young children and people who have problems with their brain function (dementia).  Pregnancy. Heat therapy should only be used on old, pre-existing, or long-lasting (chronic) injuries. Do not use heat therapy on new injuries unless directed by your health care provider. HOW TO USE HEAT THERAPY There are several different kinds of heat therapy, including:  Moist heat pack.  Warm water bath.  Hot water bottle.  Electric heating pad.  Heated gel pack.  Heated wrap.  Electric heating pad. Use the heat therapy method suggested by your health care provider. Follow your health care provider's instructions on when and how to use heat therapy. GENERAL HEAT THERAPY RECOMMENDATIONS  Do not  sleep while using heat therapy. Only use heat therapy while you are awake.  Your skin may turn pink while using heat therapy. Do not use heat therapy if your skin turns red.  Do not use heat therapy if you have new pain.  High heat or long exposure to heat can cause burns. Be careful when using heat therapy to avoid burning your skin.  Do not use heat therapy on areas of your skin that are already irritated, such as with a rash or sunburn. SEEK MEDICAL CARE IF:  You have blisters, redness, swelling, or numbness.  You have new pain.  Your pain is worse. MAKE SURE YOU:  Understand these instructions.  Will watch your condition.  Will get help right away if you are not doing well or get worse.   This information is not intended to replace advice given to you by your health care provider. Make sure you discuss any questions you have with your health care provider.   Document Released: 01/17/2012 Document Revised: 11/15/2014 Document Reviewed: 12/18/2013 Elsevier Interactive Patient Education [employer]

## 2016-03-18 ENCOUNTER — Other Ambulatory Visit: Payer: Self-pay | Admitting: Vascular Surgery

## 2016-03-23 ENCOUNTER — Encounter: Admission: RE | Payer: Self-pay | Source: Ambulatory Visit

## 2016-03-23 ENCOUNTER — Ambulatory Visit: Admission: RE | Admit: 2016-03-23 | Payer: Medicare Other | Source: Ambulatory Visit | Admitting: Vascular Surgery

## 2016-03-23 SURGERY — A/V SHUNTOGRAM/FISTULAGRAM
Anesthesia: Moderate Sedation | Laterality: Left

## 2016-04-08 ENCOUNTER — Ambulatory Visit
Admission: RE | Admit: 2016-04-08 | Discharge: 2016-04-08 | Disposition: A | Discharge status: Home / Self Care | Payer: Medicare (Managed Care) | Source: Ambulatory Visit | Attending: Vascular Surgery | Admitting: Vascular Surgery

## 2016-04-08 ENCOUNTER — Encounter
Admission: RE | Disposition: A | Discharge status: Home / Self Care | Payer: Self-pay | Source: Ambulatory Visit | Attending: Vascular Surgery

## 2016-04-08 DIAGNOSIS — Y832 Surgical operation with anastomosis, bypass or graft as the cause of abnormal reaction of the patient, or of later complication, without mention of misadventure at the time of the procedure: Secondary | ICD-10-CM | POA: Insufficient documentation

## 2016-04-08 DIAGNOSIS — K219 Gastro-esophageal reflux disease without esophagitis: Secondary | ICD-10-CM | POA: Diagnosis not present

## 2016-04-08 DIAGNOSIS — I6529 Occlusion and stenosis of unspecified carotid artery: Secondary | ICD-10-CM | POA: Diagnosis not present

## 2016-04-08 DIAGNOSIS — Z992 Dependence on renal dialysis: Secondary | ICD-10-CM | POA: Diagnosis not present

## 2016-04-08 DIAGNOSIS — Z794 Long term (current) use of insulin: Secondary | ICD-10-CM | POA: Insufficient documentation

## 2016-04-08 DIAGNOSIS — Z9049 Acquired absence of other specified parts of digestive tract: Secondary | ICD-10-CM | POA: Insufficient documentation

## 2016-04-08 DIAGNOSIS — J449 Chronic obstructive pulmonary disease, unspecified: Secondary | ICD-10-CM | POA: Insufficient documentation

## 2016-04-08 DIAGNOSIS — T82898A Other specified complication of vascular prosthetic devices, implants and grafts, initial encounter: Secondary | ICD-10-CM | POA: Diagnosis present

## 2016-04-08 DIAGNOSIS — Z833 Family history of diabetes mellitus: Secondary | ICD-10-CM | POA: Diagnosis not present

## 2016-04-08 DIAGNOSIS — I132 Hypertensive heart and chronic kidney disease with heart failure and with stage 5 chronic kidney disease, or end stage renal disease: Secondary | ICD-10-CM | POA: Diagnosis not present

## 2016-04-08 DIAGNOSIS — Z8673 Personal history of transient ischemic attack (TIA), and cerebral infarction without residual deficits: Secondary | ICD-10-CM | POA: Insufficient documentation

## 2016-04-08 DIAGNOSIS — G43909 Migraine, unspecified, not intractable, without status migrainosus: Secondary | ICD-10-CM | POA: Insufficient documentation

## 2016-04-08 DIAGNOSIS — E1122 Type 2 diabetes mellitus with diabetic chronic kidney disease: Secondary | ICD-10-CM | POA: Diagnosis not present

## 2016-04-08 DIAGNOSIS — N186 End stage renal disease: Secondary | ICD-10-CM | POA: Diagnosis not present

## 2016-04-08 DIAGNOSIS — Z7982 Long term (current) use of aspirin: Secondary | ICD-10-CM | POA: Insufficient documentation

## 2016-04-08 DIAGNOSIS — I509 Heart failure, unspecified: Secondary | ICD-10-CM | POA: Insufficient documentation

## 2016-04-08 DIAGNOSIS — Z87891 Personal history of nicotine dependence: Secondary | ICD-10-CM | POA: Diagnosis not present

## 2016-04-08 DIAGNOSIS — E785 Hyperlipidemia, unspecified: Secondary | ICD-10-CM | POA: Diagnosis not present

## 2016-04-08 HISTORY — PX: PERIPHERAL VASCULAR CATHETERIZATION: SHX172C

## 2016-04-08 LAB — POTASSIUM (ARMC VASCULAR LAB ONLY): POTASSIUM (ARMC VASCULAR LAB): 4.1 (ref 3.5–5.1)

## 2016-04-08 SURGERY — A/V SHUNTOGRAM/FISTULAGRAM
Anesthesia: Moderate Sedation

## 2016-04-08 MED ORDER — HEPARIN SODIUM (PORCINE) 1000 UNIT/ML IJ SOLN
INTRAMUSCULAR | Status: AC
  Filled 2016-04-08: qty 1

## 2016-04-08 MED ORDER — ONDANSETRON HCL 4 MG/2ML IJ SOLN: 4.0000 mg | Freq: Four times a day (QID) | INTRAMUSCULAR | Status: DC | PRN

## 2016-04-08 MED ORDER — FAMOTIDINE 20 MG PO TABS: 40.0000 mg | ORAL_TABLET | ORAL | Status: DC | PRN

## 2016-04-08 MED ORDER — FENTANYL CITRATE (PF) 100 MCG/2ML IJ SOLN
INTRAMUSCULAR | Status: DC | PRN
  Administered 2016-04-08 (×2): 50 ug via INTRAVENOUS

## 2016-04-08 MED ORDER — GUAIFENESIN-DM 100-10 MG/5ML PO SYRP: 15.0000 mL | ORAL_SOLUTION | ORAL | Status: DC | PRN

## 2016-04-08 MED ORDER — MIDAZOLAM HCL 2 MG/2ML IJ SOLN
INTRAMUSCULAR | Status: DC | PRN
  Administered 2016-04-08: 2 mg via INTRAVENOUS
  Administered 2016-04-08: 1 mg via INTRAVENOUS

## 2016-04-08 MED ORDER — METOPROLOL TARTRATE 5 MG/5ML IV SOLN: 2.0000 mg | INTRAVENOUS | Status: DC | PRN

## 2016-04-08 MED ORDER — ACETAMINOPHEN 325 MG RE SUPP
325.0000 mg | RECTAL | Status: DC | PRN
  Filled 2016-04-08: qty 2

## 2016-04-08 MED ORDER — ACETAMINOPHEN 325 MG PO TABS: 325.0000 mg | ORAL_TABLET | ORAL | Status: DC | PRN

## 2016-04-08 MED ORDER — SODIUM CHLORIDE 0.9 % IV SOLN
INTRAVENOUS | Status: DC
  Administered 2016-04-08: 10:00:00 via INTRAVENOUS

## 2016-04-08 MED ORDER — HYDROMORPHONE HCL 1 MG/ML IJ SOLN: 1.0000 mg | Freq: Once | INTRAMUSCULAR | Status: DC

## 2016-04-08 MED ORDER — LIDOCAINE-EPINEPHRINE (PF) 1 %-1:200000 IJ SOLN
INTRAMUSCULAR | Status: AC
  Filled 2016-04-08: qty 30

## 2016-04-08 MED ORDER — HEPARIN (PORCINE) IN NACL 2-0.9 UNIT/ML-% IJ SOLN
INTRAMUSCULAR | Status: AC
  Filled 2016-04-08: qty 1000

## 2016-04-08 MED ORDER — MORPHINE SULFATE (PF) 4 MG/ML IV SOLN: 2.0000 mg | INTRAVENOUS | Status: DC | PRN

## 2016-04-08 MED ORDER — HEPARIN SODIUM (PORCINE) 1000 UNIT/ML IJ SOLN
INTRAMUSCULAR | Status: DC | PRN
  Administered 2016-04-08: 3000 [IU] via INTRAVENOUS

## 2016-04-08 MED ORDER — CEFUROXIME SODIUM 1.5 G IJ SOLR
1.5000 g | INTRAMUSCULAR | Status: AC
Start: 2016-04-08 — End: 2016-04-08
  Administered 2016-04-08: 1.5 g via INTRAVENOUS

## 2016-04-08 MED ORDER — METHYLPREDNISOLONE SODIUM SUCC 125 MG IJ SOLR: 125.0000 mg | INTRAMUSCULAR | Status: DC | PRN

## 2016-04-08 MED ORDER — ONDANSETRON HCL 4 MG/2ML IJ SOLN
4.0000 mg | Freq: Four times a day (QID) | INTRAMUSCULAR | Status: DC | PRN
Start: 2016-04-08 — End: 2016-04-09

## 2016-04-08 MED ORDER — ALUM & MAG HYDROXIDE-SIMETH 200-200-20 MG/5ML PO SUSP: 15.0000 mL | ORAL | Status: DC | PRN

## 2016-04-08 MED ORDER — MIDAZOLAM HCL 5 MG/5ML IJ SOLN
INTRAMUSCULAR | Status: AC
  Filled 2016-04-08: qty 5

## 2016-04-08 MED ORDER — FENTANYL CITRATE (PF) 100 MCG/2ML IJ SOLN
INTRAMUSCULAR | Status: AC
  Filled 2016-04-08: qty 2

## 2016-04-08 MED ORDER — LABETALOL HCL 5 MG/ML IV SOLN: 10.0000 mg | INTRAVENOUS | Status: DC | PRN

## 2016-04-08 MED ORDER — OXYCODONE-ACETAMINOPHEN 5-325 MG PO TABS: 1.0000 | ORAL_TABLET | ORAL | Status: DC | PRN

## 2016-04-08 MED ORDER — LIDOCAINE-EPINEPHRINE (PF) 1 %-1:200000 IJ SOLN
INTRAMUSCULAR | Status: DC | PRN
  Administered 2016-04-08: 10 mL via INTRADERMAL

## 2016-04-08 MED ORDER — PHENOL 1.4 % MT LIQD: 1.0000 | OROMUCOSAL | Status: DC | PRN

## 2016-04-08 MED ORDER — HYDRALAZINE HCL 20 MG/ML IJ SOLN: 5.0000 mg | INTRAMUSCULAR | Status: DC | PRN

## 2016-04-08 MED ORDER — IOPAMIDOL (ISOVUE-300) INJECTION 61%
INTRAVENOUS | Status: DC | PRN
Start: 2016-04-08 — End: 2016-04-08
  Administered 2016-04-08: 50 mL via INTRA_ARTERIAL

## 2016-04-08 MED ORDER — SODIUM CHLORIDE 0.9 % IV SOLN: 500.0000 mL | Freq: Once | INTRAVENOUS | Status: DC | PRN

## 2016-04-08 SURGICAL SUPPLY — 14 items
BALLN ARMADA 9X60X80 (BALLOONS) ×4
BALLN DORADO 7X60X80 (BALLOONS) ×4
BALLOON ARMADA 9X60X80 (BALLOONS) ×2 IMPLANT
BALLOON DORADO 7X60X80 (BALLOONS) ×2 IMPLANT
CANNULA 5F STIFF (CANNULA) ×4 IMPLANT
CATH RIM 65CM (CATHETERS) ×4 IMPLANT
COIL MREYE 5X8 (Vascular Products) ×8 IMPLANT
DEVICE PRESTO INFLATION (MISCELLANEOUS) ×4 IMPLANT
DRAPE BRACHIAL (DRAPES) ×4 IMPLANT
PACK ANGIOGRAPHY (CUSTOM PROCEDURE TRAY) ×4 IMPLANT
SHEATH BRITE TIP 6FRX5.5 (SHEATH) ×4 IMPLANT
TOWEL OR 17X26 4PK STRL BLUE (TOWEL DISPOSABLE) ×4 IMPLANT
WIRE J 3MM .035X145CM (WIRE) ×4 IMPLANT
WIRE MAGIC TOR.035 180C (WIRE) ×4 IMPLANT

## 2016-04-08 NOTE — H&P (Signed)
[facility] Admission History & Physical  MRN : [id number]  Nichole Hurst is a 65 y.o. (1951-07-10) female who presents with chief complaint of No chief complaint on file. Marland Kitchen  History of Present Illness: Patient sent from dialysis access center for poorly functioning access.  The flow rates are down and she is sent over for evaluation.  She has no other complaints.  She denies fever or chills.  She has no chest pain or shortness of breath.  Current Facility-Administered Medications  Medication Dose Route Frequency Provider Last Rate Last Dose  . cefUROXime (ZINACEF) 1.5 g in dextrose 5 % 50 mL IVPB  1.5 g Intravenous 30 min Pre-Op Tonette Lederer, PA-C       Current Outpatient Prescriptions  Medication Sig Dispense Refill  . acetaminophen (TYLENOL) 325 MG tablet Take by mouth.    Marland Kitchen albuterol-ipratropium (COMBIVENT) 18-103 MCG/ACT inhaler Inhale into the lungs.    Marland Kitchen amLODipine (NORVASC) 5 MG tablet Take 1 tablet (5 mg total) by mouth daily. 90 tablet 3  . aspirin EC 81 MG tablet Take by mouth.    Marland Kitchen atorvastatin (LIPITOR) 20 MG tablet Take 1 tablet (20 mg total) by mouth daily at 6 PM. 90 tablet 3  . Blood Pressure Monitoring (BLOOD PRESSURE CUFF) MISC 1 each by Does not apply route daily. 1 each 0  . calcium acetate (PHOSLO) 667 MG capsule Take 1,334 mg by mouth.    . carvedilol (COREG) 12.5 MG tablet Take 1 tablet (12.5 mg total) by mouth 2 (two) times daily with a meal. 180 tablet 3  . docusate sodium (COLACE) 100 MG capsule Take 1 tablet once or twice daily as needed for constipation while taking narcotic pain medicine 30 capsule 0  . Ferrous Sulfate 140 (45 FE) MG TBCR 140 mg daily.     . ferrous sulfate 325 (65 FE) MG tablet Take 325 mg by mouth.    Marland Kitchen HYDROcodone-acetaminophen (NORCO/VICODIN) 5-325 MG tablet Take 1-2 tablets by mouth every 4 (four) hours as needed for moderate pain. 15 tablet 0  . Insulin Glargine (LANTUS SOLOSTAR) 100 UNIT/ML Solostar  Pen Inject 26 Units into the skin at bedtime. 15 mL 11  . lisinopril (PRINIVIL,ZESTRIL) 10 MG tablet Take 1 tablet (10 mg total) by mouth daily. 90 tablet 3  . pantoprazole (PROTONIX) 40 MG tablet Take by mouth.    . sevelamer carbonate (RENVELA) 800 MG tablet Take 2 tablets (1,600 mg total) by mouth 3 (three) times daily with meals. 270 tablet 3    Past Medical History  Diagnosis Date  . Diabetes mellitus without complication (HCC)   . Hypertension   . Renal failure   . Hyperlipidemia   . Anemia   . Colon polyp   . GERD (gastroesophageal reflux disease)   . Emphysema/COPD (HCC)   . Migraine   . Stroke (HCC) 09/2015    Ischemic stroke with R sided hemiparesis  . Carotid stenosis   . CHF (congestive heart failure) [facility])     Past Surgical History  Procedure Laterality Date  . Cholecystectomy    . Tonsilectomy, adenoidectomy, bilateral myringotomy and tubes      Social History Social History  Substance Use Topics  . Smoking status: Former Games developer  . Smokeless tobacco: Former Neurosurgeon  . Alcohol Use: No  No IVDU  Family History Family History  Problem Relation Age of Onset  . Diabetes Mother   no bleeding disorders, clotting disorders, or autoimmune diseases  No  Known Allergies   REVIEW OF SYSTEMS (Negative unless checked)  Constitutional: [] Weight loss  [] Fever  [] Chills Cardiac: [] Chest pain   [] Chest pressure   [] Palpitations   [] Shortness of breath when laying flat   [] Shortness of breath at rest   [] Shortness of breath with exertion. Vascular:  [] Pain in legs with walking   [] Pain in legs at rest   [] Pain in legs when laying flat   [] Claudication   [] Pain in feet when walking  [] Pain in feet at rest  [] Pain in feet when laying flat   [] History of DVT   [] Phlebitis   [] Swelling in legs   [] Varicose veins   [] Non-healing ulcers Pulmonary:   [] Uses home oxygen   [] Productive cough   [] Hemoptysis   [] Wheeze  [] COPD   [] Asthma Neurologic:  [] Dizziness  [] Blackouts    [] Seizures   [] History of stroke   [] History of TIA  [] Aphasia   [] Temporary blindness   [] Dysphagia   [] Weakness or numbness in arms   [] Weakness or numbness in legs Musculoskeletal:  [] Arthritis   [] Joint swelling   [] Joint pain   [] Low back pain Hematologic:  [] Easy bruising  [] Easy bleeding   [] Hypercoagulable state   [] Anemic  [] Hepatitis Gastrointestinal:  [] Blood in stool   [] Vomiting blood  [] Gastroesophageal reflux/heartburn   [] Difficulty swallowing. Genitourinary:  [x] Chronic kidney disease   [] Difficult urination  [] Frequent urination  [] Burning with urination   [] Blood in urine Skin:  [] Rashes   [] Ulcers   [] Wounds Psychological:  [] History of anxiety   []  History of major depression.  Physical Examination  There were no vitals filed for this visit. There is no weight on file to calculate BMI. Gen: WD/WN, NAD Head: [place]/AT, No temporalis wasting. Prominent temp pulse not noted. Ear/Nose/Throat: Hearing grossly intact, nares w/o erythema or drainage, oropharynx w/o Erythema/Exudate,  Eyes: PERRLA, EOMI.  Neck: Supple, no nuchal rigidity.  No JVD.  Pulmonary:  Good air movement, equal bilaterally, no use of accessory muscles.  Cardiac: RRR, normal S1, S2. Vascular: thrill and bruit present in AVF Vessel Right Left  Radial Palpable Palpable                                   Gastrointestinal: soft, non-tender/non-distended. No guarding/reflex.  Musculoskeletal: M/S 5/5 throughout.  Extremities without ischemic changes.  No deformity or atrophy.  Neurologic: CN 2-12 intact. Pain and light touch intact in extremities.  Symmetrical.  Speech is fluent. Motor exam as listed above. Psychiatric: Judgment intact, Mood & affect appropriate for pt's clinical situation. Dermatologic: No rashes or ulcers noted.  No cellulitis or open wounds. Lymph : No Cervical, Axillary, or Inguinal lymphadenopathy.     CBC Lab Results  Component Value Date   WBC 11.8* 02/14/2016   HGB 11.7*  02/14/2016   HCT 35.7 02/14/2016   MCV 92.0 02/14/2016   PLT 293 02/14/2016    BMET    Component Value Date/Time   NA 132* 02/14/2016 2314   K 4.1 02/14/2016 2314   CL 91* 02/14/2016 2314   CO2 25 02/14/2016 2314   GLUCOSE 227* 02/14/2016 2314   BUN 35* 02/14/2016 2314   CREATININE 7.36* 02/14/2016 2314   CALCIUM 9.1 02/14/2016 2314   GFRNONAA 5* 02/14/2016 2314   GFRAA 6* 02/14/2016 2314   CrCl cannot be calculated (Unknown ideal weight.).  COAG No results found for: INR, PROTIME  Radiology No results found.  Assessment/Plan 1. Dysfunction of dialysis access.  For evaluation with fistulagram today.   2. ESRD. On HD. Try to improve her access and evaluation today.   3. DM. Stable. On outpatient medications.   4. HTN. Stable on outpatient medications. 5. History of stroke and carotid artery stenosis.  No recent symtpoms.   Japhet Morgenthaler, MD  04/08/2016 8:21 AM

## 2016-04-08 NOTE — Discharge Instructions (Signed)

## 2016-04-08 NOTE — Op Note (Signed)
[facility]    OPERATIVE NOTE   PROCEDURE: 1.   Left brachiocephalic arteriovenous fistula cannulation under ultrasound guidance 2.   Left arm fistulagram including central venogram 3.   Percutaneous transluminal angioplasty of upper arm cephalic vein with 7 mm diameter high pressure angioplasty balloon 4.   Percutaneous transluminal angioplasty of cephalic vein/subclavian vein confluence into the innominate vein with 7 mm diameter and 9 mm diameter angioplasty balloons 5.   Coil embolization of a mid upper arm cephalic vein branch with two 8 mm diameter by 5 cm length coils  PRE-OPERATIVE DIAGNOSIS: 1. ESRD 2. Poorly functional left brachiocephalic AVF  POST-OPERATIVE DIAGNOSIS: same as above   SURGEON: Leotis Pain, MD  ANESTHESIA: local with MCS  ESTIMATED BLOOD LOSS: 20 cc  FINDING(S): 1. Diffuse narrowing of the cephalic vein in the proximal arm with what appeared to be a pretty high-grade stenosis into the subclavian vein and innominate vein with a more central emptying of the cephalic vein into these vessels than typical. Large mid upper arm cephalic vein branch stealing blood flow from the fistula. Several other smaller branches which did not seem to steal blood from the fistula. The initial flow to the central veins was a little more difficult to opacify secondary to the competing flow through the branches which was actually more brisk than the actual fistula.  SPECIMEN(S):  None  CONTRAST: 50 cc  FLUORO TIME: 3.7 minutes  MODERATE CONSCIOUS SEDATION TIME: Approximately 30 minutes with 3 mg of Versed and 100 mcg of Fentanyl   INDICATIONS: Nichole Hurst is a 65 y.o. female who presents with malfunctioning  left brachiocephalic arteriovenous fistula.  The patient is scheduled for  left arm fistulagram.  The patient is aware the risks include but are not limited to: bleeding, infection, thrombosis of the cannulated access, and possible anaphylactic  reaction to the contrast.  The patient is aware of the risks of the procedure and elects to proceed forward.  DESCRIPTION: After full informed written consent was obtained, the patient was brought back to the angiography suite and placed supine upon the angiography table.  The patient was connected to monitoring equipment. Moderate conscious sedation was administered with a face to face encounter with the patient throughout the procedure with my supervision of the RN administering medicines and monitoring the patient's vital signs and mental status throughout from the start of the procedure until the patient was taken to the recovery room. The  left arm was prepped and draped in the standard fashion for a percutaneous access intervention.  Under ultrasound guidance, the  arterial access site of the left brachiocephalic arteriovenous fistula was cannulated with a micropuncture needle under direct ultrasound guidance in an antegrade fashion and a permanent image was performed.  The microwire was advanced into the fistula and the needle was exchanged for the a microsheath.  I then upsized to a 6 Fr Sheath and imaging was performed.  Hand injections were completed to image the access including the central venous system. This demonstrated diffuse narrowing of the cephalic vein in the proximal arm with what appeared to be a 60-70% stenosis as well as what appeared to be a pretty high-grade stenosis into the subclavian vein and innominate vein with a more central emptying of the cephalic vein into these vessels than typical. Large mid upper arm cephalic vein branch stealing blood flow from the fistula. Several other smaller branches which did not seem to steal blood from the fistula. The initial flow  to the central veins was a little more difficult to opacify secondary to the competing flow through the branches which was actually more brisk than the actual fistula..  Based on the images, this patient will need the  cephalic vein and central vein stenoses addressed as well as occlusion of the large competing branch. I then gave the patient 3000 units of intravenous heparin.  I then crossed the stenosis with a Magic Tourqe wire.  Based on the imaging, a 7 mm x 6 cm  high pressure angioplasty balloon was selected.  The balloon was centered around the proximal arm cephalic vein stenosis and inflated to 16 ATM for 1 minute(s).  2 inflations were required to cover the area of the proximal arm cephalic vein. On completion imaging, a 30 % residual stenosis was present.   I then advanced into the subclavian and into the innominate vein. Initially I used a 7 mm diameter by 6 cm length high pressure balloon but this was slightly undersized. I then upsized to a 9 mm diameter by 6 cm length angioplasty balloon. This was inflated to 12 atm for 1 minute and again 2 inflations were required. After this, there appeared to be a less than 30% residual stenosis. I then turned my attention to the occlusion of the large competing branch. I was able to get a rim catheter out the branch and advanced with a Magic torque wire into the midportion of the branch about halfway between the cephalic vein and where the branch  would terminate in the brachial vein. this was a sizable branch. I then used 65m diameter by 5 cm length coils and deployed to these coils into this branch to occluded from flow and stop the competitive flow. Resultant imaging following these coil embolizations did show more brisk flow through the fistula with occlusion of this large branch. Based on the completion imaging, no further intervention is necessary.  The wire and balloon were removed from the sheath.  A 4-0 Monocryl purse-string suture was sewn around the sheath.  The sheath was removed while tying down the suture.  A sterile bandage was applied to the puncture site.  COMPLICATIONS: None  CONDITION: Stable   Nichole Hurst  04/08/2016 10:52 AM

## 2016-04-12 ENCOUNTER — Encounter: Payer: Self-pay | Admitting: Vascular Surgery

## 2016-08-23 ENCOUNTER — Encounter: Payer: Self-pay | Admitting: *Deleted

## 2016-08-23 NOTE — Pre-Procedure Instructions (Signed)
CLEARED BY DR Zoila ShutterOSENBERG 08/12/16

## 2016-08-24 NOTE — H&P (Signed)
See scanned note.

## 2016-08-25 ENCOUNTER — Encounter
Admission: RE | Disposition: A | Discharge status: Home / Self Care | Payer: Self-pay | Source: Ambulatory Visit | Attending: Ophthalmology

## 2016-08-25 ENCOUNTER — Encounter: Payer: Self-pay | Admitting: *Deleted

## 2016-08-25 ENCOUNTER — Ambulatory Visit: Payer: Medicare (Managed Care) | Admitting: Certified Registered Nurse Anesthetist

## 2016-08-25 ENCOUNTER — Ambulatory Visit
Admission: RE | Admit: 2016-08-25 | Discharge: 2016-08-25 | Disposition: A | Discharge status: Home / Self Care | Payer: Medicare (Managed Care) | Source: Ambulatory Visit | Attending: Ophthalmology | Admitting: Ophthalmology

## 2016-08-25 DIAGNOSIS — I2510 Atherosclerotic heart disease of native coronary artery without angina pectoris: Secondary | ICD-10-CM | POA: Diagnosis not present

## 2016-08-25 DIAGNOSIS — Z955 Presence of coronary angioplasty implant and graft: Secondary | ICD-10-CM | POA: Insufficient documentation

## 2016-08-25 DIAGNOSIS — Z79899 Other long term (current) drug therapy: Secondary | ICD-10-CM | POA: Diagnosis not present

## 2016-08-25 DIAGNOSIS — E1122 Type 2 diabetes mellitus with diabetic chronic kidney disease: Secondary | ICD-10-CM | POA: Insufficient documentation

## 2016-08-25 DIAGNOSIS — D649 Anemia, unspecified: Secondary | ICD-10-CM | POA: Diagnosis not present

## 2016-08-25 DIAGNOSIS — N186 End stage renal disease: Secondary | ICD-10-CM | POA: Diagnosis not present

## 2016-08-25 DIAGNOSIS — K219 Gastro-esophageal reflux disease without esophagitis: Secondary | ICD-10-CM | POA: Diagnosis not present

## 2016-08-25 DIAGNOSIS — H2511 Age-related nuclear cataract, right eye: Secondary | ICD-10-CM | POA: Insufficient documentation

## 2016-08-25 DIAGNOSIS — Z950 Presence of cardiac pacemaker: Secondary | ICD-10-CM | POA: Insufficient documentation

## 2016-08-25 DIAGNOSIS — I509 Heart failure, unspecified: Secondary | ICD-10-CM | POA: Insufficient documentation

## 2016-08-25 DIAGNOSIS — Z7982 Long term (current) use of aspirin: Secondary | ICD-10-CM | POA: Insufficient documentation

## 2016-08-25 DIAGNOSIS — E1151 Type 2 diabetes mellitus with diabetic peripheral angiopathy without gangrene: Secondary | ICD-10-CM | POA: Diagnosis not present

## 2016-08-25 DIAGNOSIS — Z87891 Personal history of nicotine dependence: Secondary | ICD-10-CM | POA: Diagnosis not present

## 2016-08-25 DIAGNOSIS — I132 Hypertensive heart and chronic kidney disease with heart failure and with stage 5 chronic kidney disease, or end stage renal disease: Secondary | ICD-10-CM | POA: Diagnosis not present

## 2016-08-25 DIAGNOSIS — Z992 Dependence on renal dialysis: Secondary | ICD-10-CM | POA: Insufficient documentation

## 2016-08-25 DIAGNOSIS — E1136 Type 2 diabetes mellitus with diabetic cataract: Secondary | ICD-10-CM | POA: Diagnosis not present

## 2016-08-25 DIAGNOSIS — Z8673 Personal history of transient ischemic attack (TIA), and cerebral infarction without residual deficits: Secondary | ICD-10-CM | POA: Diagnosis not present

## 2016-08-25 DIAGNOSIS — Z7984 Long term (current) use of oral hypoglycemic drugs: Secondary | ICD-10-CM | POA: Diagnosis not present

## 2016-08-25 HISTORY — DX: Presence of cardiac pacemaker: Z95.0

## 2016-08-25 HISTORY — PX: CATARACT EXTRACTION W/PHACO: SHX586

## 2016-08-25 HISTORY — DX: Orthopnea: R06.01

## 2016-08-25 HISTORY — DX: Hypoxemia: R09.02

## 2016-08-25 LAB — GLUCOSE, CAPILLARY
GLUCOSE-CAPILLARY: 111 mg/dL — AB (ref 65–99)
Glucose-Capillary: 92 mg/dL (ref 65–99)

## 2016-08-25 LAB — POCT I-STAT 4, (NA,K, GLUC, HGB,HCT)
Glucose, Bld: 110 mg/dL — ABNORMAL HIGH (ref 65–99)
HCT: 34 % — ABNORMAL LOW (ref 36.0–46.0)
HEMOGLOBIN: 11.6 g/dL — AB (ref 12.0–15.0)
Potassium: 3.7 mmol/L (ref 3.5–5.1)
SODIUM: 143 mmol/L (ref 135–145)

## 2016-08-25 SURGERY — PHACOEMULSIFICATION, CATARACT, WITH IOL INSERTION
Anesthesia: General | Laterality: Right | Wound class: Clean

## 2016-08-25 MED ORDER — PROPARACAINE HCL 0.5 % OP SOLN: OPHTHALMIC | Status: DC | PRN

## 2016-08-25 MED ORDER — TETRACAINE HCL 0.5 % OP SOLN
OPHTHALMIC | Status: DC | PRN
  Administered 2016-08-25: 1 [drp] via OPHTHALMIC

## 2016-08-25 MED ORDER — METOPROLOL TARTRATE 5 MG/5ML IV SOLN
INTRAVENOUS | Status: DC | PRN
  Administered 2016-08-25: 3 mg via INTRAVENOUS

## 2016-08-25 MED ORDER — CYCLOPENTOLATE HCL 2 % OP SOLN
1.0000 [drp] | OPHTHALMIC | Status: AC
  Administered 2016-08-25 (×4): 1 [drp] via OPHTHALMIC

## 2016-08-25 MED ORDER — CEFUROXIME OPHTHALMIC INJECTION 1 MG/0.1 ML
INJECTION | OPHTHALMIC | Status: AC
  Filled 2016-08-25: qty 0.1

## 2016-08-25 MED ORDER — TETRACAINE HCL 0.5 % OP SOLN
OPHTHALMIC | Status: AC
  Filled 2016-08-25: qty 2

## 2016-08-25 MED ORDER — MOXIFLOXACIN HCL 0.5 % OP SOLN
1.0000 [drp] | OPHTHALMIC | Status: AC
  Administered 2016-08-25 (×3): 1 [drp] via OPHTHALMIC

## 2016-08-25 MED ORDER — CEFUROXIME OPHTHALMIC INJECTION 1 MG/0.1 ML
INJECTION | OPHTHALMIC | Status: DC | PRN
  Administered 2016-08-25: 0.1 mL via INTRACAMERAL

## 2016-08-25 MED ORDER — MIDAZOLAM HCL 2 MG/2ML IJ SOLN
INTRAMUSCULAR | Status: DC | PRN
Start: 2016-08-25 — End: 2016-08-25
  Administered 2016-08-25 (×2): 1 mg via INTRAVENOUS

## 2016-08-25 MED ORDER — EPINEPHRINE PF 1 MG/ML IJ SOLN
INTRAOCULAR | Status: DC | PRN
  Administered 2016-08-25: 250 mL via OPHTHALMIC

## 2016-08-25 MED ORDER — LIDOCAINE HCL (PF) 4 % IJ SOLN
INTRAMUSCULAR | Status: DC | PRN
  Administered 2016-08-25: .5 mL via OPHTHALMIC

## 2016-08-25 MED ORDER — HYALURONIDASE HUMAN 150 UNIT/ML IJ SOLN
INTRAMUSCULAR | Status: AC
  Filled 2016-08-25: qty 1

## 2016-08-25 MED ORDER — NA CHONDROIT SULF-NA HYALURON 40-17 MG/ML IO SOLN
INTRAOCULAR | Status: DC | PRN
  Administered 2016-08-25: 1 mL via INTRAOCULAR

## 2016-08-25 MED ORDER — BUPIVACAINE HCL (PF) 0.75 % IJ SOLN
INTRAMUSCULAR | Status: AC
  Filled 2016-08-25: qty 10

## 2016-08-25 MED ORDER — NA CHONDROIT SULF-NA HYALURON 40-17 MG/ML IO SOLN
INTRAOCULAR | Status: AC
  Filled 2016-08-25: qty 1

## 2016-08-25 MED ORDER — LABETALOL HCL 5 MG/ML IV SOLN
INTRAVENOUS | Status: DC | PRN
  Administered 2016-08-25: 5 mg via INTRAVENOUS

## 2016-08-25 MED ORDER — CARBACHOL 0.01 % IO SOLN
INTRAOCULAR | Status: DC | PRN
  Administered 2016-08-25: 0.5 mL via INTRAOCULAR

## 2016-08-25 MED ORDER — LIDOCAINE HCL (PF) 4 % IJ SOLN
INTRAMUSCULAR | Status: AC
  Filled 2016-08-25: qty 5

## 2016-08-25 MED ORDER — PHENYLEPHRINE HCL 10 % OP SOLN
1.0000 [drp] | OPHTHALMIC | Status: DC
  Administered 2016-08-25 (×4): 1 [drp] via OPHTHALMIC

## 2016-08-25 MED ORDER — EPINEPHRINE PF 1 MG/ML IJ SOLN
INTRAMUSCULAR | Status: AC
  Filled 2016-08-25: qty 2

## 2016-08-25 MED ORDER — PHENYLEPHRINE HCL 10 % OP SOLN
OPHTHALMIC | Status: AC
  Filled 2016-08-25: qty 5

## 2016-08-25 MED ORDER — POVIDONE-IODINE 5 % OP SOLN
OPHTHALMIC | Status: DC | PRN
  Administered 2016-08-25: 1 via OPHTHALMIC

## 2016-08-25 MED ORDER — ALFENTANIL 500 MCG/ML IJ INJ
INJECTION | INTRAMUSCULAR | Status: DC | PRN
  Administered 2016-08-25: 500 ug via INTRAVENOUS

## 2016-08-25 MED ORDER — SODIUM CHLORIDE 0.9 % IV SOLN
INTRAVENOUS | Status: DC
  Administered 2016-08-25: 09:00:00 via INTRAVENOUS

## 2016-08-25 MED ORDER — CYCLOPENTOLATE HCL 2 % OP SOLN
OPHTHALMIC | Status: AC
  Filled 2016-08-25: qty 2

## 2016-08-25 MED ORDER — POVIDONE-IODINE 5 % OP SOLN
OPHTHALMIC | Status: AC
  Filled 2016-08-25: qty 30

## 2016-08-25 MED ORDER — ACETAZOLAMIDE SODIUM 500 MG IJ SOLR
INTRAMUSCULAR | Status: DC | PRN
  Administered 2016-08-25: 500 mg via INTRAVENOUS

## 2016-08-25 MED ORDER — ACETAZOLAMIDE SODIUM 500 MG IJ SOLR
INTRAMUSCULAR | Status: AC
  Filled 2016-08-25: qty 500

## 2016-08-25 MED ORDER — MOXIFLOXACIN HCL 0.5 % OP SOLN
OPHTHALMIC | Status: DC | PRN
  Administered 2016-08-25: 1 [drp] via OPHTHALMIC

## 2016-08-25 MED ORDER — MOXIFLOXACIN HCL 0.5 % OP SOLN
OPHTHALMIC | Status: AC
  Filled 2016-08-25: qty 3

## 2016-08-25 MED ORDER — LIDOCAINE HCL (PF) 4 % IJ SOLN
INTRAMUSCULAR | Status: DC | PRN
  Administered 2016-08-25: 5 mL via OPHTHALMIC

## 2016-08-25 SURGICAL SUPPLY — 34 items
CANNULA ANT/CHMB 27GA (MISCELLANEOUS) ×3 IMPLANT
CORD BIP STRL DISP 12FT (MISCELLANEOUS) ×3 IMPLANT
CUP MEDICINE 2OZ PLAST GRAD ST (MISCELLANEOUS) ×3 IMPLANT
DRAPE XRAY CASSETTE 23X24 (DRAPES) ×3 IMPLANT
ERASER HMR WETFIELD 18G (MISCELLANEOUS) ×3 IMPLANT
GLOVE BIO SURGEON STRL SZ8 (GLOVE) ×3 IMPLANT
GLOVE SURG LX 6.5 MICRO (GLOVE) ×2
GLOVE SURG LX 8.0 MICRO (GLOVE) ×2
GLOVE SURG LX STRL 6.5 MICRO (GLOVE) ×1 IMPLANT
GLOVE SURG LX STRL 8.0 MICRO (GLOVE) ×1 IMPLANT
GOWN STRL REUS W/ TWL LRG LVL3 (GOWN DISPOSABLE) ×1 IMPLANT
GOWN STRL REUS W/ TWL XL LVL3 (GOWN DISPOSABLE) ×1 IMPLANT
GOWN STRL REUS W/TWL LRG LVL3 (GOWN DISPOSABLE) ×2
GOWN STRL REUS W/TWL XL LVL3 (GOWN DISPOSABLE) ×2
LENS ALCON ACRYSOF 21.0D (Intraocular Lens) IMPLANT
LENS IOL ACRSF MP 21.0 (Intraocular Lens) ×1 IMPLANT
LENS IOL ACRYSOF IQ 21.0 (Intraocular Lens) IMPLANT
LENS IOL ACRYSOF POST 21.0 (Intraocular Lens) ×3 IMPLANT
PACK CATARACT (MISCELLANEOUS) ×3 IMPLANT
PACK CATARACT DINGLEDEIN LX (MISCELLANEOUS) ×3 IMPLANT
PACK EYE AFTER SURG (MISCELLANEOUS) ×3 IMPLANT
PACK VIT ANT 23G (MISCELLANEOUS) ×3 IMPLANT
SHLD EYE VISITEC  UNIV (MISCELLANEOUS) ×3 IMPLANT
SOL BSS BAG (MISCELLANEOUS) ×3
SOL PREP PVP 2OZ (MISCELLANEOUS) ×3
SOLUTION BSS BAG (MISCELLANEOUS) ×1 IMPLANT
SOLUTION PREP PVP 2OZ (MISCELLANEOUS) ×1 IMPLANT
SUT ETHILON 10 0 CS140 6 (SUTURE) ×3 IMPLANT
SUT SILK 5-0 (SUTURE) ×3 IMPLANT
SYR 3ML LL SCALE MARK (SYRINGE) ×3 IMPLANT
SYR 5ML LL (SYRINGE) ×3 IMPLANT
SYR TB 1ML 27GX1/2 LL (SYRINGE) ×3 IMPLANT
WATER STERILE IRR 250ML POUR (IV SOLUTION) ×3 IMPLANT
WIPE NON LINTING 3.25X3.25 (MISCELLANEOUS) ×3 IMPLANT

## 2016-08-25 NOTE — Anesthesia Postprocedure Evaluation (Signed)
Anesthesia Post Note  Patient: Sheppard EvensGloria Alsip  Procedure(s) Performed: Procedure(s) (LRB): CATARACT EXTRACTION PHACO AND INTRAOCULAR LENS PLACEMENT (IOC) AND ANTERIOR VITRECTOMY (Right)  Patient location during evaluation: PACU Anesthesia Type: General Level of consciousness: awake and alert and oriented Pain management: pain level controlled Vital Signs Assessment: post-procedure vital signs reviewed and stable Respiratory status: spontaneous breathing Cardiovascular status: blood pressure returned to baseline Anesthetic complications: no    Last Vitals:  Vitals:   08/25/16 1118 08/25/16 1129  BP: (!) 189/71 (!) 163/62  Pulse: 79 81  Resp: 16 14  Temp: 36.7 C 36.7 C    Last Pain:  Vitals:   08/25/16 1129  TempSrc: Axillary                 Chasiti Waddington

## 2016-08-25 NOTE — Interval H&P Note (Signed)
History and Physical Interval Note:  08/25/2016 10:08 AM  Nichole Hurst  has presented today for surgery, with the diagnosis of CATARACT  The various methods of treatment have been discussed with the patient and family. After consideration of risks, benefits and other options for treatment, the patient has consented to  Procedure(s): CATARACT EXTRACTION PHACO AND INTRAOCULAR LENS PLACEMENT (IOC) (Right) as a surgical intervention .  The patient's history has been reviewed, patient examined, no change in status, stable for surgery.  I have reviewed the patient's chart and labs.  Questions were answered to the patient's satisfaction.     [name]

## 2016-08-25 NOTE — Transfer of Care (Signed)
Immediate Anesthesia Transfer of Care Note  Patient: Nichole Hurst  Procedure(s) Performed: Procedure(s) with comments: CATARACT EXTRACTION PHACO AND INTRAOCULAR LENS PLACEMENT (IOC) AND ANTERIOR VITRECTOMY (Right) - FLUID CASSETTE [id number] H, EXP 01/05/18  Patient Location: PACU  Anesthesia Type:General  Level of Consciousness: awake, alert  and oriented  Airway & Oxygen Therapy: Patient Spontanous Breathing  Post-op Assessment: Report given to RN and Post -op Vital signs reviewed and stable  Post vital signs: Reviewed and stable  Last Vitals:  Vitals:   08/25/16 0857  BP: (!) 195/71  Pulse: 76  Resp: 16  Temp: 36.7 C    Last Pain:  Vitals:   08/25/16 0857  TempSrc: Oral         Complications: No apparent anesthesia complications

## 2016-08-25 NOTE — Op Note (Signed)
Date of Surgery: 08/25/2016 Date of Dictation: 08/25/2016 11:14 AM Pre-operative Diagnosis:  Nuclear Sclerotic Cataract right Eye Post-operative Diagnosis: same Procedure performed: Extra-capsular Cataract Extraction (ECCE) with placement of a posterior chamber intraocular lens (IOL) right Eye plus core vitrectomy IOL:  Implant Name Type Inv. Item Serial No. Manufacturer Lot No. LRB No. Used  LENS IOL POST 21.0 - [id number] Intraocular Lens LENS IOL POST 21.0 [id number] [facility] [id number] Right 1  LENS ALCON ACRYSOF 21.0D - [id number] Intraocular Lens LENS ALCON ACRYSOF 21.0D [id number] ALCON  Right 1  LENS IOL ACRYSOF IQ 21.0 - [id number] 147 Intraocular Lens LENS IOL ACRYSOF IQ 21.0 [id number] 147 ALCON [id number] 147 Right 1   Anesthesia: 2% Lidocaine and 4% Marcaine in a 50/50 mixture with 10 unites/ml of Hylenex given as a peribulbar Anesthesiologist: Anesthesiologist: Yves DillPaul Carroll, MD CRNA: Malva Coganatherine Beane, CRNA Complications: none Estimated Blood Loss: less than 1 ml  Description of procedure:  The patient was given anesthesia and sedation via intravenous access. The patient was then prepped and draped in the usual fashion. A 25-gauge needle was bent for initiating the capsulorhexis. A 5-0 silk suture was placed through the conjunctiva superior and inferiorly to serve as bridle sutures. Hemostasis was obtained at the superior limbus using an eraser cautery. A partial thickness groove was made at the anterior surgical limbus with a 64 Beaver blade and this was dissected anteriorly with an SYSCOlcon Crescent knife. The anterior chamber was entered at 10 o'clock with a 1.0 mm paracentesis knife and through the lamellar dissection with a 2.6 mm Alcon keratome. Epi-Shugarcaine 0.5 CC [9 cc BSS Plus (Alcon), 3 cc 4% preservative-free lidocaine (Hospira) and 4 cc 1:1000 preservative-free, bisulfite-free epinephrine] was injected into the anterior chamber via the paracentesis tract. Epi-Shugarcaine 0.5  CC [9 cc BSS Plus (Alcon), 3 cc 4% preservative-free lidocaine (Hospira) and 4 cc 1:1000 preservative-free, bisulfite-free epinephrine] was injected into the anterior chamber via the paracentesis tract. DiscoVisc was injected to replace the aqueous and a continuous tear curvilinear capsulorhexis was performed using a bent 25-gauge needle.  Balance salt on a syringe was used to perform hydro-dissection and phacoemulsification was carried out using a divide and conquer technique. At the point that the lens was being rotated it was noted to shift posteriorly. Phacoemulsification was begun and the lens clearly shifted backwards as there was a hole in the capsule. I switched to quadrant removal and was able to bring the lens up to the anterior capsule and remove the entire lens. The I/A handpiece was used to remove some peripheral cortex. A small amount remained at 12 o'clock. A small amount of vitreous came to the wound. The paracentesis wound was enlarged and a core vitrectomy was carried out. The wound was checked for vitreous and none was found.    Procedure(s) with comments: CATARACT EXTRACTION PHACO AND INTRAOCULAR LENS PLACEMENT (IOC) AND ANTERIOR VITRECTOMY (Right) - FLUID CASSETTE [id number] H, EXP 01/05/18.   The anterior chamber was filled with DiscoVisc and the IOL was folded into a Monarch cartridge. It was injected into the eye and the haptics were guided into the sulcus. The I/A handpiece was used to remove residual viscoelastic in the anterior chamber. The wound was inflated with balanced salt and checked for leaks. A single 10-0 nylon suture was placed across the incision and one across the paracentesis. The knots were rotated and buried. The wound was checked for leaks. None were found. Miostat was injected via the paracentesis track and 0.1 ml of cefuroxime containing 1 mg  of drug  was injected via the paracentesis track. The wound was checked for leaks again and none were found.   The bridal  sutures were removed and two drops of Vigamox were placed on the eye. An eye shield was placed to protect the eye and the patient was discharged to the recovery area in good condition.   Dahlton Hinde MD

## 2016-08-25 NOTE — Discharge Instructions (Addendum)
Eye Surgery Discharge Instructions  Expect mild scratchy sensation or mild soreness. DO NOT RUB YOUR EYE!  The day of surgery:  Minimal physical activity, but bed rest is not required  No reading, computer work, or close hand work  No bending, lifting, or straining.  May watch TV  For 24 hours:  No driving, legal decisions, or alcoholic beverages  Safety precautions  Eat anything you prefer: It is better to start with liquids, then soup then solid foods.  __x___ Eye patch should be worn until postoperative exam tomorrow.  ____ Solar shield eyeglasses should be worn for comfort in the sunlight/patch while sleeping  Resume all regular medications including aspirin or Coumadin if these were discontinued prior to surgery. You may shower, bathe, shave, or wash your hair. Tylenol may be taken for mild discomfort.  Call your doctor if you experience significant pain, nausea, or vomiting, fever > 101 or other signs of infection. [phone] or [phone] Specific instructions:

## 2016-08-25 NOTE — OR Nursing (Signed)
Corrected appt time with patient and family 10:40 on 08/26/2016 at Dr Dingeldein office.

## 2016-08-25 NOTE — Anesthesia Procedure Notes (Signed)
Performed by: Lacrisha Bielicki Pre-anesthesia Checklist: Patient identified, Emergency Drugs available, Suction available, Patient being monitored and Timeout performed Patient Re-evaluated:Patient Re-evaluated prior to inductionOxygen Delivery Method: Nasal cannula Intubation Type: IV induction       

## 2016-08-25 NOTE — Anesthesia Preprocedure Evaluation (Signed)
Anesthesia Evaluation  Patient identified by MRN, date of birth, ID band Patient awake    Reviewed: Allergy & Precautions, NPO status , Patient's Chart, lab work & pertinent test results, reviewed documented beta blocker date and time   Airway Mallampati: III       Dental  (+) Upper Dentures   Pulmonary shortness of breath and with exertion, pneumonia, resolved, COPD,  COPD inhaler, former smoker,    Pulmonary exam normal        Cardiovascular hypertension, Pt. on medications and Pt. on home beta blockers + CAD, + Peripheral Vascular Disease, +CHF and + Orthopnea  Normal cardiovascular exam+ dysrhythmias + pacemaker      Neuro/Psych  Headaches, CVA, Residual Symptoms negative psych ROS   GI/Hepatic GERD  Medicated,  Endo/Other  diabetes, Well Controlled, Type 2, Oral Hypoglycemic Agents  Renal/GU ESRF and DialysisRenal disease     Musculoskeletal   Abdominal   Peds  Hematology  (+) anemia ,   Anesthesia Other Findings   Reproductive/Obstetrics                             Anesthesia Physical Anesthesia Plan  ASA: IV  Anesthesia Plan: General   Post-op Pain Management:    Induction: Intravenous  Airway Management Planned:   Additional Equipment:   Intra-op Plan:   Post-operative Plan:   Informed Consent: I have reviewed the patients History and Physical, chart, labs and discussed the procedure including the risks, benefits and alternatives for the proposed anesthesia with the patient or authorized representative who has indicated his/her understanding and acceptance.   Dental advisory given  Plan Discussed with: CRNA and Surgeon  Anesthesia Plan Comments:         Anesthesia Quick Evaluation

## 2017-06-02 ENCOUNTER — Other Ambulatory Visit (INDEPENDENT_AMBULATORY_CARE_PROVIDER_SITE_OTHER): Payer: Medicare (Managed Care)

## 2017-06-02 DIAGNOSIS — M14679 Charcot's joint, unspecified ankle and foot: Secondary | ICD-10-CM

## 2017-06-03 ENCOUNTER — Other Ambulatory Visit: Payer: Self-pay | Admitting: Family Medicine

## 2017-06-03 DIAGNOSIS — M14679 Charcot's joint, unspecified ankle and foot: Secondary | ICD-10-CM

## 2017-06-07 ENCOUNTER — Encounter (INDEPENDENT_AMBULATORY_CARE_PROVIDER_SITE_OTHER): Payer: Self-pay | Admitting: Vascular Surgery

## 2017-06-07 ENCOUNTER — Ambulatory Visit (INDEPENDENT_AMBULATORY_CARE_PROVIDER_SITE_OTHER): Payer: Medicare (Managed Care) | Admitting: Vascular Surgery

## 2017-06-07 VITALS — BP 228/96 | HR 94 | Resp 16 | Ht 61.0 in | Wt 144.0 lb

## 2017-06-07 DIAGNOSIS — I10 Essential (primary) hypertension: Secondary | ICD-10-CM

## 2017-06-07 DIAGNOSIS — Z992 Dependence on renal dialysis: Secondary | ICD-10-CM

## 2017-06-07 DIAGNOSIS — E785 Hyperlipidemia, unspecified: Secondary | ICD-10-CM | POA: Diagnosis not present

## 2017-06-07 DIAGNOSIS — N186 End stage renal disease: Secondary | ICD-10-CM

## 2017-06-07 DIAGNOSIS — I70262 Atherosclerosis of native arteries of extremities with gangrene, left leg: Secondary | ICD-10-CM | POA: Diagnosis not present

## 2017-06-07 DIAGNOSIS — E1122 Type 2 diabetes mellitus with diabetic chronic kidney disease: Secondary | ICD-10-CM

## 2017-06-07 NOTE — Progress Notes (Signed)
Patient ID: Nichole Hurst, female   DOB: 09/23/1951, 66 y.o.   MRN: [id number]  Chief Complaint  Patient presents with  . New Evaluation    abnormal ultrasound    HPI Nichole EvensGloria Massengale is a 66 y.o. female.  I am asked to see the patient by Dr. Zoila Shutterosenberg for evaluation of PAD and ulcerations on the left foot.  The patient reports Having difficulty with a Charcot joint at the left ankle. She has significant ambulatory dysfunction. She is now on a walking boot. Over the past couple of weeks, she has noticed the tip of her left great toe has turned black. She says she was trying to cut her toenails and cut the skin. Her first second and third toes are significantly discolored but the tip of the left great toe is black. It is not overtly painful to her but she has significant neuropathy. She denies fever or chills. She does not really walk enough to determine how much she has claudication. She denies ischemic rest pain. No current right leg symptoms. These concerns prompted noninvasive studies performed last week. Her ABIs are falsely elevated due to medial calcification. Her digital pressure is undetectable on the left and mildly reduced at 78 on the right. Waveforms are fair at the ankle and poor in the toes on the left.   Past Medical History:  Diagnosis Date  . Anemia   . Carotid stenosis   . CHF (congestive heart failure) (HCC)   . Colon polyp   . Coronary artery disease   . Diabetes mellitus without complication (HCC)   . Dyspnea    DOE WALKING FAST  . Dysrhythmia   . Emphysema/COPD (HCC)   . GERD (gastroesophageal reflux disease)   . Hyperlipidemia   . Hypertension   . Migraine   . Orthopnea   . Oxygen deficiency    AS NEEDED  . Pneumonia    IN PAST  . Presence of permanent cardiac pacemaker   . Renal failure   . Stroke [facility](HCC) 09/2015   Ischemic stroke with R sided hemiparesis    Past Surgical History:  Procedure Laterality Date  . AV FISTULA PLACEMENT     LEFT ARM  .  CATARACT EXTRACTION W/PHACO Right 08/25/2016   Procedure: CATARACT EXTRACTION PHACO AND INTRAOCULAR LENS PLACEMENT (IOC) AND ANTERIOR VITRECTOMY;  Surgeon: Sallee LangeSteven Dingeldein, MD;  Location: [facility] ORS;  Service: Ophthalmology;  Laterality: Right;  FLUID CASSETTE [id number] H, EXP 01/05/18  . CHOLECYSTECTOMY    . CORONARY ARTERY BYPASS GRAFT     6/17  . INSERT / REPLACE / REMOVE PACEMAKER     6/17  . PERIPHERAL VASCULAR CATHETERIZATION Left 04/08/2016   Procedure: A/V Shuntogram/Fistulagram;  Surgeon: Annice NeedyJason S Vaiden Adames, MD;  Location: [facility] INVASIVE CV LAB;  Service: Cardiovascular;  Laterality: Left;  . PERIPHERAL VASCULAR CATHETERIZATION N/A 04/08/2016   Procedure: A/V Shunt Intervention;  Surgeon: Annice NeedyJason S Danyel Griess, MD;  Location: [facility] INVASIVE CV LAB;  Service: Cardiovascular;  Laterality: N/A;  . STONES     KIDNEY  . TONSILECTOMY, ADENOIDECTOMY, BILATERAL MYRINGOTOMY AND TUBES    . TUBAL LIGATION      Family History  Problem Relation Age of Onset  . Diabetes Mother   No bleeding disorders, clotting disorders, or autoimmune dsieases  Social History Social History  Substance Use Topics  . Smoking status: Former Games developermoker  . Smokeless tobacco: Former NeurosurgeonUser  . Alcohol use No  No IVDU  Allergies  Allergen Reactions  . Colchicine Anaphylaxis  Current Outpatient Prescriptions  Medication Sig Dispense Refill  . Ferrous Sulfate 140 (45 FE) MG TBCR 140 mg daily.     . travoprost, benzalkonium, (TRAVATAN) 0.004 % ophthalmic solution Place 1 drop into both eyes at bedtime.    . Vitamin D, Ergocalciferol, (DRISDOL) 50000 units CAPS capsule Take 50,000 Units by mouth every 30 (thirty) days.    Marland Kitchen. albuterol-ipratropium (COMBIVENT) 18-103 MCG/ACT inhaler Inhale into the lungs. Reported on 04/08/2016    . amLODipine (NORVASC) 5 MG tablet Take 1 tablet (5 mg total) by mouth daily. 90 tablet 3  . atorvastatin (LIPITOR) 20 MG tablet Take 1 tablet (20 mg total) by mouth daily at 6 PM. (Patient taking differently: Take  80 mg by mouth daily at 6 PM. ) 90 tablet 3  . Blood Pressure Monitoring (BLOOD PRESSURE CUFF) MISC 1 each by Does not apply route daily. (Patient not taking: Reported on 04/08/2016) 1 each 0  . docusate sodium (COLACE) 100 MG capsule Take 1 tablet once or twice daily as needed for constipation while taking narcotic pain medicine (Patient not taking: Reported on 04/08/2016) 30 capsule 0  . glipiZIDE (GLUCOTROL XL) 2.5 MG 24 hr tablet Take 2.5 mg by mouth daily with breakfast.    . lisinopril (PRINIVIL,ZESTRIL) 10 MG tablet Take 1 tablet (10 mg total) by mouth daily. 90 tablet 3  . metoprolol tartrate (LOPRESSOR) 25 MG tablet Take 25 mg by mouth 2 (two) times daily.    Marland Kitchen. omeprazole (PRILOSEC) 20 MG capsule Take 20 mg by mouth daily.    . ondansetron (ZOFRAN) 4 MG tablet Take 4 mg by mouth 2 (two) times daily.     No current facility-administered medications for this visit.       REVIEW OF SYSTEMS (Negative unless checked)  Constitutional: [] Weight loss  [] Fever  [] Chills Cardiac: [] Chest pain   [] Chest pressure   [] Palpitations   [] Shortness of breath when laying flat   [] Shortness of breath at rest   [x] Shortness of breath with exertion. Vascular:  [] Pain in legs with walking   [] Pain in legs at rest   [] Pain in legs when laying flat   [] Claudication   [] Pain in feet when walking  [] Pain in feet at rest  [] Pain in feet when laying flat   [] History of DVT   [] Phlebitis   [x] Swelling in legs   [] Varicose veins   [x] Non-healing ulcers Pulmonary:   [] Uses home oxygen   [] Productive cough   [] Hemoptysis   [] Wheeze  [] COPD   [] Asthma Neurologic:  [] Dizziness  [] Blackouts   [] Seizures   [] History of stroke   [] History of TIA  [] Aphasia   [] Temporary blindness   [] Dysphagia   [] Weakness or numbness in arms   [x] Weakness or numbness in legs Musculoskeletal:  [x] Arthritis   [] Joint swelling   [] Joint pain   [] Low back pain Hematologic:  [] Easy bruising  [] Easy bleeding   [] Hypercoagulable state   [] Anemic   [] Hepatitis Gastrointestinal:  [] Blood in stool   [] Vomiting blood  [] Gastroesophageal reflux/heartburn   [] Abdominal pain Genitourinary:  [x] Chronic kidney disease   [] Difficult urination  [] Frequent urination  [] Burning with urination   [] Hematuria Skin:  [] Rashes   [x] Ulcers   [x] Wounds Psychological:  [] History of anxiety   []  History of major depression.    Physical Exam BP (!) 228/96   Pulse 94   Resp 16   Ht 5\' 1"  (1.549 m)   Wt 144 lb (65.3 kg)   BMI 27.21 kg/m  Gen:  WD/WN, NAD Head: [place]/AT, No temporalis wasting.  Ear/Nose/Throat: Hearing grossly intact, nares w/o erythema or drainage, oropharynx w/o Erythema/Exudate Eyes: Conjunctiva clear, sclera non-icteric  Neck: trachea midline.  No JVD.  Pulmonary:  Good air movement, respirations not labored, no use of accessory muscles Cardiac: RRR, normal S1, S2 Vascular: thrill present in AVF Vessel Right Left  Radial Palpable Palpable                      Popliteal Not Palpable Not Palpable  PT 1+ Palpable Trace Palpable  DP Trace Palpable Not Palpable   Gastrointestinal: soft, non-tender/non-distended. Musculoskeletal: M/S 5/5 throughout.  Left foot with mild to moderate Charcot changes. 1+ RLE edema, 2+ LLE edema.Toes one, two and three on the left has some cyanosis with distal tip of the left great toe with black eschar. Neurologic: Sensation grossly intact in extremities.  Symmetrical.  Speech is fluent. Motor exam as listed above. Psychiatric: Judgment intact, Mood & affect appropriate for pt's clinical situation. Dermatologic: Tip of left great toe has about 2 cm of black eschar.      Radiology No results found.  Labs No results found for this or any previous visit (from the past 2160 hour(s)).  Assessment/Plan:  No problem-specific Assessment & Plan notes found for this encounter.      Festus Barren 06/07/2017, 3:53 PM   This note was created with Dragon medical transcription system.  Any errors from  dictation are unintentional.

## 2017-06-08 ENCOUNTER — Other Ambulatory Visit (INDEPENDENT_AMBULATORY_CARE_PROVIDER_SITE_OTHER): Payer: Self-pay

## 2017-06-08 ENCOUNTER — Telehealth (INDEPENDENT_AMBULATORY_CARE_PROVIDER_SITE_OTHER): Payer: Self-pay | Admitting: Vascular Surgery

## 2017-06-08 DIAGNOSIS — I70262 Atherosclerosis of native arteries of extremities with gangrene, left leg: Secondary | ICD-10-CM

## 2017-06-08 DIAGNOSIS — I70269 Atherosclerosis of native arteries of extremities with gangrene, unspecified extremity: Secondary | ICD-10-CM | POA: Insufficient documentation

## 2017-06-08 NOTE — Assessment & Plan Note (Signed)
lipid control important in reducing the progression of atherosclerotic disease. Continue statin therapy  

## 2017-06-08 NOTE — Assessment & Plan Note (Signed)
blood glucose control important in reducing the progression of atherosclerotic disease. Also, involved in wound healing. On appropriate medications.  

## 2017-06-08 NOTE — Telephone Encounter (Signed)
DR Zoila ShutterOSENBERG CALLED TO SAY THAT DR Wyn QuakerEW IS DOING A PROCEDURE ON Nichole Hurst NEXT WEEK. SHE NEEDS TO TALK TO DR Wyn QuakerEW BEFORE HE DOES THE PROCEDURE!

## 2017-06-08 NOTE — Assessment & Plan Note (Signed)
This clearly increases her risk of limb loss and makes atherosclerotic lower extremity changes much more likely. Currently on dialysis and doing reasonably well with it.

## 2017-06-08 NOTE — Assessment & Plan Note (Signed)
blood pressure control important in reducing the progression of atherosclerotic disease. On appropriate oral medications.  

## 2017-06-08 NOTE — Assessment & Plan Note (Signed)
The patient has worsening gangrenous changes of the tip of the left great toe. These concerns prompted noninvasive studies performed last week. Her ABIs are falsely elevated due to medial calcification. Her digital pressure is undetectable on the left and mildly reduced at 78 on the right. Waveforms are fair at the ankle and poor in the toes on the left. This clearly represents a critically limb threatening situation. She has a very high risk of limb loss. At this point, she needs to proceed with an angiogram with hopes of improving her perfusion to the left lower extremity. If we are able to get her perfusion improved, hopefully we can then get her wounds to heal without major limb loss. I suspect she is going to lose the tip of her great toe at the least. Risks and benefits are discussed. She agrees to proceed.

## 2017-06-08 NOTE — Patient Instructions (Signed)

## 2017-06-09 ENCOUNTER — Encounter
Admission: RE | Admit: 2017-06-09 | Discharge: 2017-06-09 | Disposition: A | Discharge status: Home / Self Care | Payer: Medicare (Managed Care) | Source: Ambulatory Visit | Attending: Vascular Surgery | Admitting: Vascular Surgery

## 2017-06-09 DIAGNOSIS — Z01812 Encounter for preprocedural laboratory examination: Secondary | ICD-10-CM | POA: Diagnosis present

## 2017-06-09 HISTORY — DX: Peripheral vascular disease, unspecified: I73.9

## 2017-06-09 HISTORY — DX: Unspecified multiple injuries, initial encounter: T07.XXXA

## 2017-06-09 HISTORY — DX: Unspecified glaucoma: H40.9

## 2017-06-09 LAB — POTASSIUM: Potassium: 3.9 mmol/L (ref 3.5–5.1)

## 2017-06-09 NOTE — Telephone Encounter (Signed)
[phone] 

## 2017-06-09 NOTE — Telephone Encounter (Signed)
Do we have a number for Dr. Zoila Shutterosenberg?

## 2017-06-12 MED ORDER — CEFAZOLIN SODIUM-DEXTROSE 1-4 GM/50ML-% IV SOLN
1.0000 g | Freq: Once | INTRAVENOUS | Status: AC
  Administered 2017-06-13: 1 g via INTRAVENOUS

## 2017-06-13 ENCOUNTER — Encounter
Admission: RE | Disposition: A | Discharge status: Home / Self Care | Payer: Self-pay | Source: Ambulatory Visit | Attending: Vascular Surgery

## 2017-06-13 ENCOUNTER — Ambulatory Visit
Admission: RE | Admit: 2017-06-13 | Discharge: 2017-06-13 | Disposition: A | Discharge status: Home / Self Care | Payer: Medicare (Managed Care) | Source: Ambulatory Visit | Attending: Vascular Surgery | Admitting: Vascular Surgery

## 2017-06-13 DIAGNOSIS — Z9049 Acquired absence of other specified parts of digestive tract: Secondary | ICD-10-CM | POA: Insufficient documentation

## 2017-06-13 DIAGNOSIS — I132 Hypertensive heart and chronic kidney disease with heart failure and with stage 5 chronic kidney disease, or end stage renal disease: Secondary | ICD-10-CM | POA: Diagnosis not present

## 2017-06-13 DIAGNOSIS — J449 Chronic obstructive pulmonary disease, unspecified: Secondary | ICD-10-CM | POA: Insufficient documentation

## 2017-06-13 DIAGNOSIS — I6529 Occlusion and stenosis of unspecified carotid artery: Secondary | ICD-10-CM | POA: Diagnosis not present

## 2017-06-13 DIAGNOSIS — E785 Hyperlipidemia, unspecified: Secondary | ICD-10-CM | POA: Insufficient documentation

## 2017-06-13 DIAGNOSIS — Z888 Allergy status to other drugs, medicaments and biological substances status: Secondary | ICD-10-CM | POA: Insufficient documentation

## 2017-06-13 DIAGNOSIS — I509 Heart failure, unspecified: Secondary | ICD-10-CM | POA: Diagnosis not present

## 2017-06-13 DIAGNOSIS — N186 End stage renal disease: Secondary | ICD-10-CM | POA: Insufficient documentation

## 2017-06-13 DIAGNOSIS — Z7984 Long term (current) use of oral hypoglycemic drugs: Secondary | ICD-10-CM | POA: Diagnosis not present

## 2017-06-13 DIAGNOSIS — I70262 Atherosclerosis of native arteries of extremities with gangrene, left leg: Secondary | ICD-10-CM | POA: Diagnosis not present

## 2017-06-13 DIAGNOSIS — G43909 Migraine, unspecified, not intractable, without status migrainosus: Secondary | ICD-10-CM | POA: Insufficient documentation

## 2017-06-13 DIAGNOSIS — Z992 Dependence on renal dialysis: Secondary | ICD-10-CM | POA: Diagnosis not present

## 2017-06-13 DIAGNOSIS — Z9851 Tubal ligation status: Secondary | ICD-10-CM | POA: Insufficient documentation

## 2017-06-13 DIAGNOSIS — E1122 Type 2 diabetes mellitus with diabetic chronic kidney disease: Secondary | ICD-10-CM | POA: Insufficient documentation

## 2017-06-13 DIAGNOSIS — I2510 Atherosclerotic heart disease of native coronary artery without angina pectoris: Secondary | ICD-10-CM | POA: Diagnosis not present

## 2017-06-13 DIAGNOSIS — Z950 Presence of cardiac pacemaker: Secondary | ICD-10-CM | POA: Diagnosis not present

## 2017-06-13 DIAGNOSIS — Z86010 Personal history of colonic polyps: Secondary | ICD-10-CM | POA: Diagnosis not present

## 2017-06-13 DIAGNOSIS — Z8673 Personal history of transient ischemic attack (TIA), and cerebral infarction without residual deficits: Secondary | ICD-10-CM | POA: Diagnosis not present

## 2017-06-13 DIAGNOSIS — K219 Gastro-esophageal reflux disease without esophagitis: Secondary | ICD-10-CM | POA: Diagnosis not present

## 2017-06-13 DIAGNOSIS — D631 Anemia in chronic kidney disease: Secondary | ICD-10-CM | POA: Diagnosis not present

## 2017-06-13 DIAGNOSIS — Z951 Presence of aortocoronary bypass graft: Secondary | ICD-10-CM | POA: Diagnosis not present

## 2017-06-13 DIAGNOSIS — Z87891 Personal history of nicotine dependence: Secondary | ICD-10-CM | POA: Diagnosis not present

## 2017-06-13 DIAGNOSIS — Z9841 Cataract extraction status, right eye: Secondary | ICD-10-CM | POA: Insufficient documentation

## 2017-06-13 DIAGNOSIS — L97529 Non-pressure chronic ulcer of other part of left foot with unspecified severity: Secondary | ICD-10-CM | POA: Insufficient documentation

## 2017-06-13 DIAGNOSIS — I429 Cardiomyopathy, unspecified: Secondary | ICD-10-CM | POA: Diagnosis not present

## 2017-06-13 DIAGNOSIS — I70268 Atherosclerosis of native arteries of extremities with gangrene, other extremity: Secondary | ICD-10-CM | POA: Diagnosis present

## 2017-06-13 DIAGNOSIS — Z87442 Personal history of urinary calculi: Secondary | ICD-10-CM | POA: Insufficient documentation

## 2017-06-13 DIAGNOSIS — Z833 Family history of diabetes mellitus: Secondary | ICD-10-CM | POA: Insufficient documentation

## 2017-06-13 HISTORY — PX: LOWER EXTREMITY ANGIOGRAPHY: CATH118251

## 2017-06-13 LAB — GLUCOSE, CAPILLARY: Glucose-Capillary: 152 mg/dL — ABNORMAL HIGH (ref 65–99)

## 2017-06-13 SURGERY — LOWER EXTREMITY ANGIOGRAPHY
Anesthesia: Moderate Sedation | Laterality: Left

## 2017-06-13 MED ORDER — GUAIFENESIN-DM 100-10 MG/5ML PO SYRP: 15.0000 mL | ORAL_SOLUTION | ORAL | Status: DC | PRN

## 2017-06-13 MED ORDER — LABETALOL HCL 5 MG/ML IV SOLN: 10.0000 mg | INTRAVENOUS | Status: DC | PRN

## 2017-06-13 MED ORDER — CLOPIDOGREL BISULFATE 75 MG PO TABS: 75.0000 mg | ORAL_TABLET | Freq: Every day | ORAL | 11 refills | Status: DC

## 2017-06-13 MED ORDER — HYDRALAZINE HCL 20 MG/ML IJ SOLN: 5.0000 mg | INTRAMUSCULAR | Status: DC | PRN

## 2017-06-13 MED ORDER — CEFAZOLIN SODIUM-DEXTROSE 1-4 GM/50ML-% IV SOLN
INTRAVENOUS | Status: AC
  Filled 2017-06-13: qty 50

## 2017-06-13 MED ORDER — LIDOCAINE-EPINEPHRINE (PF) 2 %-1:200000 IJ SOLN
INTRAMUSCULAR | Status: AC
  Filled 2017-06-13: qty 20

## 2017-06-13 MED ORDER — SODIUM CHLORIDE 0.9 % IV SOLN: 500.0000 mL | Freq: Once | INTRAVENOUS | Status: DC | PRN

## 2017-06-13 MED ORDER — MIDAZOLAM HCL 5 MG/5ML IJ SOLN
INTRAMUSCULAR | Status: AC
  Filled 2017-06-13: qty 10

## 2017-06-13 MED ORDER — METOPROLOL TARTRATE 5 MG/5ML IV SOLN
2.0000 mg | INTRAVENOUS | Status: DC | PRN
Start: 2017-06-13 — End: 2017-06-13

## 2017-06-13 MED ORDER — HYDROMORPHONE HCL 1 MG/ML IJ SOLN: 0.5000 mg | INTRAMUSCULAR | Status: DC | PRN

## 2017-06-13 MED ORDER — ONDANSETRON HCL 4 MG/2ML IJ SOLN
4.0000 mg | Freq: Four times a day (QID) | INTRAMUSCULAR | Status: DC | PRN
Start: 2017-06-13 — End: 2017-06-13

## 2017-06-13 MED ORDER — CLOPIDOGREL BISULFATE 75 MG PO TABS
75.0000 mg | ORAL_TABLET | Freq: Once | ORAL | Status: AC
  Administered 2017-06-13: 75 mg via ORAL

## 2017-06-13 MED ORDER — FENTANYL CITRATE (PF) 100 MCG/2ML IJ SOLN
INTRAMUSCULAR | Status: DC | PRN
  Administered 2017-06-13: 25 ug via INTRAVENOUS
  Administered 2017-06-13 (×2): 50 ug via INTRAVENOUS

## 2017-06-13 MED ORDER — FENTANYL CITRATE (PF) 100 MCG/2ML IJ SOLN
INTRAMUSCULAR | Status: AC
  Filled 2017-06-13: qty 4

## 2017-06-13 MED ORDER — PHENOL 1.4 % MT LIQD: 1.0000 | OROMUCOSAL | Status: DC | PRN

## 2017-06-13 MED ORDER — CLOPIDOGREL BISULFATE 75 MG PO TABS
ORAL_TABLET | ORAL | Status: AC
  Filled 2017-06-13: qty 1

## 2017-06-13 MED ORDER — MIDAZOLAM HCL 2 MG/2ML IJ SOLN
INTRAMUSCULAR | Status: DC | PRN
  Administered 2017-06-13: 1 mg via INTRAVENOUS
  Administered 2017-06-13: 2 mg via INTRAVENOUS

## 2017-06-13 MED ORDER — OXYCODONE-ACETAMINOPHEN 5-325 MG PO TABS: 1.0000 | ORAL_TABLET | ORAL | Status: DC | PRN

## 2017-06-13 MED ORDER — IOPAMIDOL (ISOVUE-300) INJECTION 61%
INTRAVENOUS | Status: DC | PRN
  Administered 2017-06-13: 65 mL via INTRA_ARTERIAL

## 2017-06-13 MED ORDER — HEPARIN (PORCINE) IN NACL 2-0.9 UNIT/ML-% IJ SOLN
INTRAMUSCULAR | Status: AC
  Filled 2017-06-13: qty 1000

## 2017-06-13 MED ORDER — ACETAMINOPHEN 325 MG RE SUPP
325.0000 mg | RECTAL | Status: DC | PRN
  Filled 2017-06-13: qty 2

## 2017-06-13 MED ORDER — SODIUM CHLORIDE 0.9 % IV SOLN
INTRAVENOUS | Status: DC
  Administered 2017-06-13: 08:00:00 via INTRAVENOUS

## 2017-06-13 MED ORDER — ACETAMINOPHEN 325 MG PO TABS: 325.0000 mg | ORAL_TABLET | ORAL | Status: DC | PRN

## 2017-06-13 MED ORDER — HEPARIN SODIUM (PORCINE) 1000 UNIT/ML IJ SOLN
INTRAMUSCULAR | Status: AC
  Filled 2017-06-13: qty 1

## 2017-06-13 SURGICAL SUPPLY — 21 items
BALLN ARMADA 2X120X150 (BALLOONS) ×2
BALLN ULTRVRSE 2.5X300X150 (BALLOONS) ×2
BALLN ULTRVRSE 3X100X150 (BALLOONS) ×2
BALLOON ARMADA 2X120X150 (BALLOONS) ×1 IMPLANT
BALLOON ULTRVRSE 2.5X300X150 (BALLOONS) ×1 IMPLANT
BALLOON ULTRVRSE 3X100X150 (BALLOONS) ×1 IMPLANT
CATH BEACON 5 .038 100 VERT TP (CATHETERS) ×2 IMPLANT
CATH IMAGER II S 5FR 65CM (MISCELLANEOUS) ×2 IMPLANT
DEVICE PRESTO INFLATION (MISCELLANEOUS) ×2 IMPLANT
DEVICE STARCLOSE SE CLOSURE (Vascular Products) ×2 IMPLANT
GUIDEWIRE SUPER STIFF .035X180 (WIRE) ×2 IMPLANT
PACK ANGIOGRAPHY (CUSTOM PROCEDURE TRAY) ×2 IMPLANT
SHEATH ANL1 5FRX70 (SHEATH) ×2 IMPLANT
SHEATH BRITE TIP 4FRX11 (SHEATH) ×2 IMPLANT
SHEATH BRITE TIP 5FRX11 (SHEATH) ×2 IMPLANT
SHEATH RAABE 6FRX70 (SHEATH) ×2 IMPLANT
SYR MEDRAD MARK V 150ML (SYRINGE) ×2 IMPLANT
TUBING CONTRAST HIGH PRESS 72 (TUBING) ×2 IMPLANT
WIRE G V18X300CM (WIRE) ×2 IMPLANT
WIRE J 3MM .035X145CM (WIRE) ×2 IMPLANT
WIRE MAGIC TORQUE 260C (WIRE) ×2 IMPLANT

## 2017-06-13 NOTE — OR Nursing (Signed)
[facility] called to give her and her sister a ride home to her apartment. Discharge instructions given, and right femoral site is without hematoma or bleeding.

## 2017-06-13 NOTE — Op Note (Signed)
[facility] Percutaneous Study/Intervention Procedural Note   Date of Surgery: 06/13/2017  Surgeon(s):Kiarah Eckstein   Assistants:none  Pre-operative Diagnosis: PAD with gangrene left foot  Post-operative diagnosis: Same  Procedure(s) Performed: 1. Ultrasound guidance for vascular access right femoral artery 2. Catheter placement into left posterior tibial artery and left anterior tibial artery from right femoral approach 3. Aortogram and selective left lower extremity angiogram 4. Percutaneous transluminal angioplasty of tibioperoneal trunk and proximal posterior tibial artery with 3 mm diameter by 10 cm length angioplasty balloon 5. Percutaneous transluminal angioplasty of the remainder of the left posterior tibial artery down into the foot with 2 mm diameter angioplasty balloon  6.  Percutaneous transluminal angioplasty of the left anterior tibial artery with 2.5 mm diameter by 30 cm length angioplasty balloon 7. StarClose closure device right femoral artery  EBL: 10 cc  Contrast: 65 cc  Fluoro Time: 7.4 minutes  Moderate Conscious Sedation Time: approximately 45 minutes using 3 mg of Versed and 125 mcg of Fentanyl  Indications: Patient is a 66 y.o.female with gangrene on the tip of her left great toe and nonhealing ulcerations on the left foot. The patient is brought in for angiography for further evaluation and potential treatment. Risks and benefits are discussed and informed consent is obtained  Procedure: The patient was identified and appropriate procedural time out was performed. The patient was then placed supine on the table and prepped and draped in the usual sterile fashion.Moderate conscious sedation was administered during a face to face encounter with the patient throughout the procedure with my supervision of the RN administering medicines and  monitoring the patient's vital signs, pulse oximetry, telemetry and mental status throughout from the start of the procedure until the patient was taken to the recovery room. Ultrasound was used to evaluate the right common femoral artery. It was patent but calcified. A digital ultrasound image was acquired. A Seldinger needle was used to access the right common femoral artery under direct ultrasound guidance and a permanent image was performed. A 0.035 J wire was advanced without resistance and a 5Fr sheath was placed. Pigtail catheter was placed into the aorta and an AP aortogram was performed. This demonstrated right renal artery with multiple high grade stenosis and poor nephrogram, left renal artery also diseased, although the stenosis did not appear as high grade.  Aorta and iliac arteries were patent. I then crossed the aortic bifurcation and advanced to the left femoral head. Selective left lower extremity angiogram was then performed. This demonstrated a 20-30% stenosis of the left common femoral artery, the SFA had a couple areas of mild stenosis of 20-30% but nothing significant. The popliteal artery was patent. There was then a normal tibial trifurcation but the tibioperoneal trunk had a greater than 80% stenosis with a small peroneal artery without focal stenosis below this. The posterior tibial artery then had a long segment occlusion but reconstituted above the ankle. The anterior tibial artery was probably the best runoff to the foot, but had multiple areas of greater than 70% stenosis and a short segment occlusion in the mid to distal segment. The patient was systemically heparinized and a 5 French 70 cm sheath was then placed over the Magic Tourque wire. I then used a Kumpe catheter and the Magic Tourque wire to navigate down into the tibial trifurcation. I then exchanged for a 0.018 wire with the Kumpe catheter and crossed the tibioperoneal trunk stenosis and then used the wire to cross the  posterior tibial  artery occlusion and parked the wire in the foot. I then treated the tibioperoneal trunk and the most proximal portions of the posterior tibial artery with a 3 mm diameter by 10 cm length angioplasty balloon. This was inflated to 8 atm for 1 minute. I then used a 2 mm diameter by 12 cm length angioplasty balloon to treat the remainder of the posterior tibial artery down to the ankle. 3 inflations were required in each inflation was from 12-14 atm for 1 minute. Angiogram following this showed in-line flow to the foot through the posterior tibial artery with no greater than 30% residual stenosis identified. I then turned my attention to the anterior tibial artery which was a larger vessel with more promising flow into the foot initially. The Kumpe catheter and the 018 wire were used to cannulate the anterior tibial artery and cross the multiple areas of stenoses and a short segment occlusion and parked the wire in the foot. I then used a 2.5 mm diameter by 30 cm length angioplasty balloon. This was parked from just above the ankle up to the proximal anterior tibial artery. This was then inflated to 10 atm and the anterior tibial artery for 1 minute. Completion angiogram showed brisk in-line flow to the foot with no greater than 20% residual stenosis in the anterior tibial artery. She now had two-vessel runoff to the foot with a small peroneal artery which also provided some additional flow to the lower leg as a third vessel. I elected to terminate the procedure. The sheath was removed and StarClose closure device was deployed in the right femoral artery with excellent hemostatic result. The patient was taken to the recovery room in stable condition having tolerated the procedure well.  Findings:  Aortogram: right renal artery with multiple high grade stenosis and poor nephrogram, left renal artery also diseased, although the stenosis did not appear as high grade.  Aorta and iliac  arteries were patent. Left Lower Extremity: There was a 20-30% stenosis of the left common femoral artery, the SFA had a couple areas of mild stenosis of 20-30% but nothing significant. The popliteal artery was patent. There was then a normal tibial trifurcation but the tibioperoneal trunk had a greater than 80% stenosis with a small peroneal artery without focal stenosis below this. The posterior tibial artery then had a long segment occlusion but reconstituted above the ankle. The anterior tibial artery was probably the best runoff to the foot, but had multiple areas of greater than 70% stenosis and a short segment occlusion in the mid to distal segment.   Disposition: Patient was taken to the recovery room in stable condition having tolerated the procedure well.  Complications: None  Leotis Pain 06/13/2017 10:45 AM   This note was created with Dragon Medical transcription system. Any errors in dictation are purely unintentional.

## 2017-06-13 NOTE — Discharge Instructions (Signed)
Angiogram, Care After °This sheet gives you information about how to care for yourself after your procedure. Your health care provider may also give you more specific instructions. If you have problems or questions, contact your health care provider. °What can I expect after the procedure? °After the procedure, it is common to have bruising and tenderness at the catheter insertion area. °Follow these instructions at home: °Insertion site care °· Follow instructions from your health care provider about how to take care of your insertion site. Make sure you: °? Wash your hands with soap and water before you change your bandage (dressing). If soap and water are not available, use hand sanitizer. °? Change your dressing as told by your health care provider. °? Leave stitches (sutures), skin glue, or adhesive strips in place. These skin closures may need to stay in place for 2 weeks or longer. If adhesive strip edges start to loosen and curl up, you may trim the loose edges. Do not remove adhesive strips completely unless your health care provider tells you to do that. °· Do not take baths, swim, or use a hot tub until your health care provider approves. °· You may shower 24-48 hours after the procedure or as told by your health care provider. °? Gently wash the site with plain soap and water. °? Pat the area dry with a clean towel. °? Do not rub the site. This may cause bleeding. °· Do not apply powder or lotion to the site. Keep the site clean and dry. °· Check your insertion site every day for signs of infection. Check for: °? Redness, swelling, or pain. °? Fluid or blood. °? Warmth. °? Pus or a bad smell. °Activity °· Rest as told by your health care provider, usually for 1-2 days. °· Do not lift anything that is heavier than 10 lbs. (4.5 kg) or as told by your health care provider. °· Do not drive for 24 hours if you were given a medicine to help you relax (sedative). °· Do not drive or use heavy machinery while  taking prescription pain medicine. °General instructions °· Return to your normal activities as told by your health care provider, usually in about a week. Ask your health care provider what activities are safe for you. °· If the catheter site starts bleeding, lie flat and put pressure on the site. If the bleeding does not stop, get help right away. This is a medical emergency. °· Drink enough fluid to keep your urine clear or pale yellow. This helps flush the contrast dye from your body. °· Take over-the-counter and prescription medicines only as told by your health care provider. °· Keep all follow-up visits as told by your health care provider. This is important. °Contact a health care provider if: °· You have a fever or chills. °· You have redness, swelling, or pain around your insertion site. °· You have fluid or blood coming from your insertion site. °· The insertion site feels warm to the touch. °· You have pus or a bad smell coming from your insertion site. °· You have bruising around the insertion site. °· You notice blood collecting in the tissue around the catheter site (hematoma). The hematoma may be painful to the touch. °Get help right away if: °· You have severe pain at the catheter insertion area. °· The catheter insertion area swells very fast. °· The catheter insertion area is bleeding, and the bleeding does not stop when you hold steady pressure on the area. °·   The area near or just beyond the catheter insertion site becomes pale, cool, tingly, or numb. These symptoms may represent a serious problem that is an emergency. Do not wait to see if the symptoms will go away. Get medical help right away. Call your local emergency services (911 in the [place] Do not drive yourself to the hospital. Summary  After the procedure, it is common to have bruising and tenderness at the catheter insertion area.  After the procedure, it is important to rest and drink plenty of fluids.  Do not take baths,  swim, or use a hot tub until your health care provider says it is okay to do so. You may shower 24-48 hours after the procedure or as told by your health care provider.  If the catheter site starts bleeding, lie flat and put pressure on the site. If the bleeding does not stop, get help right away. This is a medical emergency. This information is not intended to replace advice given to you by your health care provider. Make sure you discuss any questions you have with your health care provider. Document Released: 05/13/2005 Document Revised: 09/29/2016 Document Reviewed: 09/29/2016 Elsevier Interactive Patient Education  2017 Elsevier Inc. Moderate Conscious Sedation, Adult Sedation is the use of medicines to promote relaxation and relieve discomfort and anxiety. Moderate conscious sedation is a type of sedation. Under moderate conscious sedation, you are less alert than normal, but you are still able to respond to instructions, touch, or both. Moderate conscious sedation is used during short medical and dental procedures. It is milder than deep sedation, which is a type of sedation under which you cannot be easily woken up. It is also milder than general anesthesia, which is the use of medicines to make you unconscious. Moderate conscious sedation allows you to return to your regular activities sooner. Tell a health care provider about:  Any allergies you have.  All medicines you are taking, including vitamins, herbs, eye drops, creams, and over-the-counter medicines.  Use of steroids (by mouth or creams).  Any problems you or family members have had with sedatives and anesthetic medicines.  Any blood disorders you have.  Any surgeries you have had.  Any medical conditions you have, such as sleep apnea.  Whether you are pregnant or may be pregnant.  Any use of cigarettes, alcohol, marijuana, or street drugs. What are the risks? Generally, this is a safe procedure. However, problems may  occur, including:  Getting too much medicine (oversedation).  Nausea.  Allergic reaction to medicines.  Trouble breathing. If this happens, a breathing tube may be used to help with breathing. It will be removed when you are awake and breathing on your own.  Heart trouble.  Lung trouble.  What happens before the procedure? Staying hydrated Follow instructions from your health care provider about hydration, which may include:  Up to 2 hours before the procedure - you may continue to drink clear liquids, such as water, clear fruit juice, black coffee, and plain tea.  Eating and drinking restrictions Follow instructions from your health care provider about eating and drinking, which may include:  8 hours before the procedure - stop eating heavy meals or foods such as meat, fried foods, or fatty foods.  6 hours before the procedure - stop eating light meals or foods, such as toast or cereal.  6 hours before the procedure - stop drinking milk or drinks that contain milk.  2 hours before the procedure - stop drinking clear liquids.  Medicine  Ask your health care provider about:  Changing or stopping your regular medicines. This is especially important if you are taking diabetes medicines or blood thinners.  Taking medicines such as aspirin and ibuprofen. These medicines can thin your blood. Do not take these medicines before your procedure if your health care provider instructs you not to.  Tests and exams  You will have a physical exam.  You may have blood tests done to show: ? How well your kidneys and liver are working. ? How well your blood can clot. General instructions  Plan to have someone take you home from the hospital or clinic.  If you will be going home right after the procedure, plan to have someone with you for 24 hours. What happens during the procedure?  An IV tube will be inserted into one of your veins.  Medicine to help you relax (sedative) will be  given through the IV tube.  The medical or dental procedure will be performed. What happens after the procedure?  Your blood pressure, heart rate, breathing rate, and blood oxygen level will be monitored often until the medicines you were given have worn off.  Do not drive for 24 hours. This information is not intended to replace advice given to you by your health care provider. Make sure you discuss any questions you have with your health care provider. Document Released: 07/20/2001 Document Revised: 03/30/2016 Document Reviewed: 02/14/2016 Elsevier Interactive Patient Education  [employer]2018 Else[employer] Inc.

## 2017-06-13 NOTE — H&P (Signed)
[facility] History & Physical Update  The patient was interviewed and re-examined.  The patient's previous History and Physical has been reviewed and is unchanged.  There is no change in the plan of care. We plan to proceed with the scheduled procedure.  Festus BarrenJason Buckley Bradly, MD  06/13/2017, 8:10 AM

## 2017-07-01 ENCOUNTER — Other Ambulatory Visit (INDEPENDENT_AMBULATORY_CARE_PROVIDER_SITE_OTHER): Payer: Self-pay | Admitting: Vascular Surgery

## 2017-07-01 DIAGNOSIS — I739 Peripheral vascular disease, unspecified: Secondary | ICD-10-CM

## 2017-07-05 ENCOUNTER — Encounter (INDEPENDENT_AMBULATORY_CARE_PROVIDER_SITE_OTHER): Payer: Medicare (Managed Care)

## 2017-07-05 ENCOUNTER — Ambulatory Visit (INDEPENDENT_AMBULATORY_CARE_PROVIDER_SITE_OTHER): Payer: Medicare (Managed Care) | Admitting: Vascular Surgery

## 2017-07-18 ENCOUNTER — Emergency Department
Admission: EM | Admit: 2017-07-18 | Discharge: 2017-07-18 | Disposition: A | Discharge status: Home / Self Care | Payer: Medicare (Managed Care) | Attending: Emergency Medicine | Admitting: Emergency Medicine

## 2017-07-18 DIAGNOSIS — N186 End stage renal disease: Secondary | ICD-10-CM | POA: Insufficient documentation

## 2017-07-18 DIAGNOSIS — H44442 Primary hypotony of left eye: Secondary | ICD-10-CM | POA: Diagnosis not present

## 2017-07-18 DIAGNOSIS — I5042 Chronic combined systolic (congestive) and diastolic (congestive) heart failure: Secondary | ICD-10-CM | POA: Insufficient documentation

## 2017-07-18 DIAGNOSIS — H5712 Ocular pain, left eye: Secondary | ICD-10-CM | POA: Diagnosis present

## 2017-07-18 DIAGNOSIS — E119 Type 2 diabetes mellitus without complications: Secondary | ICD-10-CM | POA: Insufficient documentation

## 2017-07-18 DIAGNOSIS — Z992 Dependence on renal dialysis: Secondary | ICD-10-CM | POA: Diagnosis not present

## 2017-07-18 DIAGNOSIS — H40052 Ocular hypertension, left eye: Secondary | ICD-10-CM

## 2017-07-18 DIAGNOSIS — I132 Hypertensive heart and chronic kidney disease with heart failure and with stage 5 chronic kidney disease, or end stage renal disease: Secondary | ICD-10-CM | POA: Diagnosis not present

## 2017-07-18 DIAGNOSIS — Z87891 Personal history of nicotine dependence: Secondary | ICD-10-CM | POA: Diagnosis not present

## 2017-07-18 LAB — COMPREHENSIVE METABOLIC PANEL
ALBUMIN: 4 g/dL (ref 3.5–5.0)
ALT: 10 U/L — ABNORMAL LOW (ref 14–54)
AST: 14 U/L — AB (ref 15–41)
Alkaline Phosphatase: 79 U/L (ref 38–126)
Anion gap: 11 (ref 5–15)
BILIRUBIN TOTAL: 0.8 mg/dL (ref 0.3–1.2)
BUN: 28 mg/dL — AB (ref 6–20)
CALCIUM: 9.2 mg/dL (ref 8.9–10.3)
CO2: 30 mmol/L (ref 22–32)
CREATININE: 5 mg/dL — AB (ref 0.44–1.00)
Chloride: 95 mmol/L — ABNORMAL LOW (ref 101–111)
GFR calc Af Amer: 10 mL/min — ABNORMAL LOW (ref >60)
GFR calc non Af Amer: 8 mL/min — ABNORMAL LOW (ref >60)
GLUCOSE: 161 mg/dL — AB (ref 65–99)
Potassium: 3.4 mmol/L — ABNORMAL LOW (ref 3.5–5.1)
Sodium: 136 mmol/L (ref 135–145)
TOTAL PROTEIN: 7.9 g/dL (ref 6.5–8.1)

## 2017-07-18 LAB — CBC WITH DIFFERENTIAL/PLATELET
BASOS ABS: 0.1 10*3/uL (ref 0–0.1)
BASOS PCT: 1 %
Eosinophils Absolute: 1 10*3/uL — ABNORMAL HIGH (ref 0–0.7)
Eosinophils Relative: 10 %
HEMATOCRIT: 29.4 % — AB (ref 35.0–47.0)
HEMOGLOBIN: 10.3 g/dL — AB (ref 12.0–16.0)
LYMPHS PCT: 21 %
Lymphs Abs: 2.1 10*3/uL (ref 1.0–3.6)
MCH: 31.9 pg (ref 26.0–34.0)
MCHC: 35 g/dL (ref 32.0–36.0)
MCV: 91.2 fL (ref 80.0–100.0)
MONOS PCT: 6 %
Monocytes Absolute: 0.6 10*3/uL (ref 0.2–0.9)
NEUTROS ABS: 6.5 10*3/uL (ref 1.4–6.5)
NEUTROS PCT: 62 %
Platelets: 183 10*3/uL (ref 150–440)
RBC: 3.23 MIL/uL — ABNORMAL LOW (ref 3.80–5.20)
RDW: 15.2 % — ABNORMAL HIGH (ref 11.5–14.5)
WBC: 10.3 10*3/uL (ref 3.6–11.0)

## 2017-07-18 MED ORDER — TETRACAINE HCL 0.5 % OP SOLN
2.0000 [drp] | Freq: Once | OPHTHALMIC | Status: AC
  Administered 2017-07-18: 2 [drp] via OPHTHALMIC
  Filled 2017-07-18: qty 4

## 2017-07-18 MED ORDER — FLUORESCEIN SODIUM 0.6 MG OP STRP
1.0000 | ORAL_STRIP | Freq: Once | OPHTHALMIC | Status: AC
  Administered 2017-07-18: 1 via OPHTHALMIC
  Filled 2017-07-18: qty 1

## 2017-07-18 MED ORDER — TIMOLOL MALEATE 0.5 % OP SOLN
1.0000 [drp] | Freq: Once | OPHTHALMIC | Status: AC
  Administered 2017-07-18: 1 [drp] via OPHTHALMIC
  Filled 2017-07-18: qty 5

## 2017-07-18 NOTE — ED Triage Notes (Signed)
Per EMS pt was at dialysis and was there for 1.5 hours.  They were able to get off of her while there.  Pt began to complain of a sharp/stabbing pain in Left eye.  Pt states it felt like it was coming from the back of her eye.  Pt now has no pain and is A&Ox4.

## 2017-07-18 NOTE — ED Notes (Signed)
Nichole Hurst was called and they said they will be here in a hour.  Pt is trying to call her insurance company to ask if they can provide her with a ride.

## 2017-07-18 NOTE — ED Notes (Signed)
Pt visual acuity is 20/70 in her right eye and she was unable to see any letters with her left eye.

## 2017-07-18 NOTE — ED Provider Notes (Signed)
[facility] Emergency Department Provider Note       Time seen: ----------------------------------------- 7:53 AM on 07/18/2017 -----------------------------------------     I have reviewed the triage vital signs and the nursing notes.   HISTORY   Chief Complaint Eye Problem    HPI Nichole Hurst is a 66 y.o. female who presents to the ED for left eye pain. Patient was undergoing dialysis and she was approximately 1.5 hours into her treatment when she began suddenly having left eye pain. She states feels like something is in the left eye. She describes blurry vision and a feeling like there is a film over her vision. Eye was flushed in route by EMS with some improvement in her symptoms. Left pupil was noted to be dilated more than the right.   Past Medical History:  Diagnosis Date  . Anemia   . Carotid stenosis   . CHF (congestive heart failure) (HCC)   . Colon polyp   . Coronary artery disease   . Diabetes mellitus without complication (HCC)   . Dyspnea    DOE WALKING FAST  . Dysrhythmia   . Emphysema/COPD (HCC)   . Fractures    Lt foot  . GERD (gastroesophageal reflux disease)   . Glaucoma   . Hyperlipidemia   . Hypertension   . Migraine   . Orthopnea   . Oxygen deficiency    AS NEEDED  . PAD (peripheral artery disease) (HCC)   . Pneumonia    IN PAST  . Presence of permanent cardiac pacemaker    [employer]   . Renal failure   . Stroke [facility]) 09/2015   Ischemic stroke with R sided hemiparesis    Patient Active Problem List   Diagnosis Date Noted  . Atherosclerosis of native arteries of the extremities with gangrene (HCC) 06/08/2017  . CHF (congestive heart failure) (HCC) 10/30/2015  . History of stroke with residual deficit 10/30/2015  . Hyperlipidemia 10/30/2015  . Cerebrovascular accident (CVA) (HCC) 09/09/2015  . CAD in native artery 08/29/2015  . Carotid artery narrowing 11/26/2014  . Anemia of chronic disease 09/03/2014  .  End stage renal failure on dialysis (HCC) 09/03/2014  . Chest pain 07/12/2014  . Breath shortness 07/12/2014  . Type 2 diabetes mellitus with end-stage renal disease (HCC) 03/17/2014  . BP (high blood pressure) 03/17/2014    Past Surgical History:  Procedure Laterality Date  . ABDOMINAL HYSTERECTOMY    . AV FISTULA PLACEMENT     LEFT ARM  . CATARACT EXTRACTION W/PHACO Right 08/25/2016   Procedure: CATARACT EXTRACTION PHACO AND INTRAOCULAR LENS PLACEMENT (IOC) AND ANTERIOR VITRECTOMY;  Surgeon: Sallee Lange, MD;  Location: [facility] ORS;  Service: Ophthalmology;  Laterality: Right;  FLUID CASSETTE [id number] H, EXP 01/05/18  . CHOLECYSTECTOMY    . CORONARY ARTERY BYPASS GRAFT     05/13/2016  . INSERT / REPLACE / REMOVE PACEMAKER     6/17  . LOWER EXTREMITY ANGIOGRAPHY Left 06/13/2017   Procedure: Lower Extremity Angiography;  Surgeon: Annice Needy, MD;  Location: [facility] INVASIVE CV LAB;  Service: Cardiovascular;  Laterality: Left;  . PACEMAKER IMPLANT    . PERIPHERAL VASCULAR CATHETERIZATION Left 04/08/2016   Procedure: A/V Shuntogram/Fistulagram;  Surgeon: Annice Needy, MD;  Location: [facility] INVASIVE CV LAB;  Service: Cardiovascular;  Laterality: Left;  . PERIPHERAL VASCULAR CATHETERIZATION N/A 04/08/2016   Procedure: A/V Shunt Intervention;  Surgeon: Annice Needy, MD;  Location: [facility] INVASIVE CV LAB;  Service: Cardiovascular;  Laterality: N/A;  .  STONES     KIDNEY  . TONSILLECTOMY    . TUBAL LIGATION      Allergies Colchicine  Social History Social History  Substance Use Topics  . Smoking status: Former Smoker    Quit date: 06/10/1983  . Smokeless tobacco: Former NeurosurgeonUser  . Alcohol use No    Review of Systems Constitutional: Negative for fever. Eyes: Positive for vision changes, left eye pain ENT:  Negative for congestion, sore throat Cardiovascular: Negative for chest pain. Respiratory: Negative for shortness of breath. Gastrointestinal: Negative for abdominal pain, vomiting and  diarrhea. Musculoskeletal: Negative for back pain. Skin: Negative for rash. Neurological: Negative for headaches, focal weakness or numbness.  All systems negative/normal/unremarkable except as stated in the HPI  ____________________________________________   PHYSICAL EXAM:  VITAL SIGNS: ED Triage Vitals [07/18/17 0750]  Enc Vitals Group     BP      Pulse      Resp      Temp      Temp src      SpO2      Weight 144 lb (65.3 kg)     Height 5\' 1"  (1.549 m)     Head Circumference      Peak Flow      Pain Score      Pain Loc      Pain Edu?      Excl. in GC?     Constitutional: Alert and oriented. Well appearing and in no distress. Eyes: Mild injection, Normal extraocular movements. Left pupil is dilated compared to the right. Poor pupillary response. ENT   Head: Normocephalic and atraumatic.   Nose: No congestion/rhinnorhea.   Mouth/Throat: Mucous membranes are moist.   Neck: No stridor. Cardiovascular: Normal rate, regular rhythm. No murmurs, rubs, or gallops. Respiratory: Normal respiratory effort without tachypnea nor retractions. Breath sounds are clear and equal bilaterally. No wheezes/rales/rhonchi. Musculoskeletal: Nontender with normal range of motion in extremities. No lower extremity tenderness nor edema. Neurologic:  Normal speech and language. No gross focal neurologic deficits are appreciated.  Skin:  Skin is warm, dry and intact. No rash noted. Psychiatric: Mood and affect are normal. Speech and behavior are normal.  ____________________________________________  ED COURSE:  Pertinent labs & imaging results that were available during my care of the patient were reviewed by me and considered in my medical decision making (see chart for details). Patient presents for left eye pain during dialysis, we will assess with labs as indicated. Left eye pressure is 44 and 46 Clinical Course as of Jul 18 858  Mon Jul 18, 2017  0830 Timolol drops were  applied to the left eye. We will recheck eye pressure in 30 minutes.  [JW]  [id number] I rechecked her eye pressure and it is still elevated at 48.  [JW]  [id number] Patient was discussed extensively with ophthalmology and he feels confident he can get her I pressure down in the office. Her son is coming to take her to the ophthalmologist office for eye injection  [JW]    Clinical Course User Index [JW] Emily FilbertWilliams, Jonathan E, MD   Procedures ____________________________________________   LABS (pertinent positives/negatives)  Labs Reviewed  CBC WITH DIFFERENTIAL/PLATELET - Abnormal; Notable for the following:       Result Value   RBC 3.23 (*)    Hemoglobin 10.3 (*)    HCT 29.4 (*)    RDW 15.2 (*)    Eosinophils Absolute 1.0 (*)    All other components within normal limits  COMPREHENSIVE METABOLIC PANEL    ____________________________________________  FINAL ASSESSMENT AND PLAN  Elevated intraocular pressure, End-stage renal disease   Plan: Patient's labs and imaging were dictated above. Patient had presented for worsening left eye pain with elevated intraocular pressures. We did apply timolol drops without improvement in her pressure measurements. Patient was again discussed with ophthalmology who has requested to see her in the office for eye injections. Her son will be transporting her to the eye doctor right away.   Emily Filbert, MD   Note: This note was generated in part or whole with voice recognition software. Voice recognition is usually quite accurate but there are transcription errors that can and very often do occur. I apologize for any typographical errors that were not detected and corrected.     Emily Filbert, MD 07/18/17 [phone]

## 2017-07-26 ENCOUNTER — Encounter (INDEPENDENT_AMBULATORY_CARE_PROVIDER_SITE_OTHER): Payer: Self-pay | Admitting: Vascular Surgery

## 2017-07-26 ENCOUNTER — Ambulatory Visit (INDEPENDENT_AMBULATORY_CARE_PROVIDER_SITE_OTHER): Payer: Medicare (Managed Care) | Admitting: Vascular Surgery

## 2017-07-26 ENCOUNTER — Ambulatory Visit (INDEPENDENT_AMBULATORY_CARE_PROVIDER_SITE_OTHER): Payer: Medicare (Managed Care)

## 2017-07-26 VITALS — BP 147/63 | HR 74 | Resp 17 | Wt 146.0 lb

## 2017-07-26 DIAGNOSIS — I70262 Atherosclerosis of native arteries of extremities with gangrene, left leg: Secondary | ICD-10-CM

## 2017-07-26 DIAGNOSIS — I10 Essential (primary) hypertension: Secondary | ICD-10-CM | POA: Diagnosis not present

## 2017-07-26 DIAGNOSIS — E785 Hyperlipidemia, unspecified: Secondary | ICD-10-CM

## 2017-07-26 DIAGNOSIS — E1122 Type 2 diabetes mellitus with diabetic chronic kidney disease: Secondary | ICD-10-CM | POA: Diagnosis not present

## 2017-07-26 DIAGNOSIS — N186 End stage renal disease: Secondary | ICD-10-CM | POA: Diagnosis not present

## 2017-07-26 DIAGNOSIS — I739 Peripheral vascular disease, unspecified: Secondary | ICD-10-CM

## 2017-07-26 NOTE — Progress Notes (Signed)
MRN : [id number]  Nichole Hurst is a 66 y.o. (08-09-1951) female who presents with chief complaint of  Chief Complaint  Patient presents with  . Follow-up    3wk [facility] abi  .  History of Present Illness: Patient returns today in follow up of PAD.  She underwent LLE angiogram with tibial intervention about 6 weeks ago. Since that time, her toe ulcerations have markedly improved. She still has dark discoloration of the tip of her left great toe, but otherwise is doing well. The tissue up to the very tip of the toe is now pink with good capillary refill. No evidence of erythema or infection. Her noninvasive studies today demonstrate marked improvement in her left lower extremity perfusion now with a digital pressure of 70. Previously we did not detect any digital pressure. Her vessels are noncompressible bilaterally. Her right digital pressure is 79. Her waveforms are now quite good bilaterally.  Current Outpatient Prescriptions  Medication Sig Dispense Refill  . aspirin EC 81 MG tablet Take 81 mg by mouth at bedtime.    Marland Kitchen atorvastatin (LIPITOR) 80 MG tablet Take 80 mg by mouth every evening.    . bimatoprost (LUMIGAN) 0.03 % ophthalmic solution Place 1 drop into both eyes 2 (two) times daily.    . calcium acetate (PHOSLO) 667 MG capsule Take 1,334 mg by mouth 3 (three) times daily with meals.    . clopidogrel (PLAVIX) 75 MG tablet Take 1 tablet (75 mg total) by mouth daily. 30 tablet 11  . Ferrous Sulfate 140 (45 Fe) MG TBCR Take 1 tablet by mouth daily.    . metoprolol tartrate (LOPRESSOR) 25 MG tablet Take 25 mg by mouth 2 (two) times daily.    . travoprost, benzalkonium, (TRAVATAN) 0.004 % ophthalmic solution Place 1 drop into both eyes at bedtime.    . Vitamin D, Ergocalciferol, (DRISDOL) 50000 units CAPS capsule Take 50,000 Units by mouth every 30 (thirty) days.    Marland Kitchen lisinopril (PRINIVIL,ZESTRIL) 10 MG tablet Take 1 tablet (10 mg total) by mouth daily. 90 tablet 3   No current  facility-administered medications for this visit.     Past Medical History:  Diagnosis Date  . Anemia   . Carotid stenosis   . CHF (congestive heart failure) ([place])   . Colon polyp   . Coronary artery disease   . Diabetes mellitus without complication ([place])   . Dyspnea    DOE WALKING FAST  . Dysrhythmia   . Emphysema/COPD ([place])   . Fractures    Lt foot  . GERD (gastroesophageal reflux disease)   . Glaucoma   . Hyperlipidemia   . Hypertension   . Migraine   . Orthopnea   . Oxygen deficiency    AS NEEDED  . PAD (peripheral artery disease) ([place])   . Pneumonia    IN PAST  . Presence of permanent cardiac pacemaker    [employer]. Jude   . Renal failure   . Stroke [facility]) 09/2015   Ischemic stroke with R sided hemiparesis    Past Surgical History:  Procedure Laterality Date  . ABDOMINAL HYSTERECTOMY    . AV FISTULA PLACEMENT     LEFT ARM  . CATARACT EXTRACTION W/PHACO Right 08/25/2016   Procedure: CATARACT EXTRACTION PHACO AND INTRAOCULAR LENS PLACEMENT (IOC) AND ANTERIOR VITRECTOMY;  Surgeon: Estill Cotta, MD;  Location: [facility] ORS;  Service: Ophthalmology;  Laterality: Right;  FLUID CASSETTE [id number], EXP 01/05/18  . CHOLECYSTECTOMY    . CORONARY ARTERY BYPASS GRAFT  05/13/2016  . INSERT / REPLACE / REMOVE PACEMAKER     6/17  . LOWER EXTREMITY ANGIOGRAPHY Left 06/13/2017   Procedure: Lower Extremity Angiography;  Surgeon: Algernon Huxley, MD;  Location: [facility] CV LAB;  Service: Cardiovascular;  Laterality: Left;  . PACEMAKER IMPLANT    . PERIPHERAL VASCULAR CATHETERIZATION Left 04/08/2016   Procedure: A/V Shuntogram/Fistulagram;  Surgeon: Algernon Huxley, MD;  Location: [place] CV LAB;  Service: Cardiovascular;  Laterality: Left;  . PERIPHERAL VASCULAR CATHETERIZATION N/A 04/08/2016   Procedure: A/V Shunt Intervention;  Surgeon: Algernon Huxley, MD;  Location: [place] CV LAB;  Service: Cardiovascular;  Laterality: N/A;  . STONES     KIDNEY  . TONSILLECTOMY    . TUBAL  LIGATION      Family History  Problem Relation Age of Onset  . Diabetes Mother   No bleeding disorders, clotting disorders, or autoimmune dsieases  Social History     Social History  Substance Use Topics  . Smoking status: Former Research scientist (life sciences)  . Smokeless tobacco: Former Systems developer  . Alcohol use No  No IVDU      Allergies  Allergen Reactions  . Colchicine Anaphylaxis    REVIEW OF SYSTEMS (Negative unless checked)  Constitutional: [] Weight loss  [] Fever  [] Chills Cardiac: [] Chest pain   [] Chest pressure   [] Palpitations   [] Shortness of breath when laying flat   [] Shortness of breath at rest   [x] Shortness of breath with exertion. Vascular:  [] Pain in legs with walking   [] Pain in legs at rest   [] Pain in legs when laying flat   [] Claudication   [] Pain in feet when walking  [] Pain in feet at rest  [] Pain in feet when laying flat   [] History of DVT   [] Phlebitis   [x] Swelling in legs   [] Varicose veins   [x] Non-healing ulcers Pulmonary:   [] Uses home oxygen   [] Productive cough   [] Hemoptysis   [] Wheeze  [] COPD   [] Asthma Neurologic:  [] Dizziness  [] Blackouts   [] Seizures   [] History of stroke   [] History of TIA  [] Aphasia   [] Temporary blindness   [] Dysphagia   [] Weakness or numbness in arms   [x] Weakness or numbness in legs Musculoskeletal:  [x] Arthritis   [] Joint swelling   [] Joint pain   [] Low back pain Hematologic:  [] Easy bruising  [] Easy bleeding   [] Hypercoagulable state   [] Anemic  [] Hepatitis Gastrointestinal:  [] Blood in stool   [] Vomiting blood  [] Gastroesophageal reflux/heartburn   [] Abdominal pain Genitourinary:  [x] Chronic kidney disease   [] Difficult urination  [] Frequent urination  [] Burning with urination   [] Hematuria Skin:  [] Rashes   [x] Ulcers   [x] Wounds Psychological:  [] History of anxiety   []  History of major depression.  Physical Examination  BP (!) 147/63   Pulse 74   Resp 17   Wt 146 lb (66.2 kg)   BMI 27.59 kg/m  Gen:  WD/WN, NAD Head: [place]/AT, No  temporalis wasting. Ear/Nose/Throat: Hearing grossly intact, nares w/o erythema or drainage, trachea midline Eyes: Conjunctiva clear. Sclera non-icteric Neck: Supple.  No JVD.  Pulmonary:  Good air movement, no use of accessory muscles.  Cardiac: RRR, no JVD Vascular:  Vessel Right Left  Radial Palpable Palpable                          PT 1+ Palpable 1+ Palpable  DP Palpable Palpable    Musculoskeletal: M/S 5/5 throughout.  No deformity or atrophy. Trace LE  edema. Black eschar is present on only the last centimeter or so of the distal portion of the left great toe. The other toes have good refill. No other ulcerations. Neurologic: Sensation grossly intact in extremities.  Symmetrical.  Speech is fluent.  Psychiatric: Judgment intact, Mood & affect appropriate for pt's clinical situation. Dermatologic: Left great toe as described above    Labs Recent Results (from the past 2160 hour(s))  Potassium     Status: None   Collection Time: 06/09/17  2:14 PM  Result Value Ref Range   Potassium 3.9 3.5 - 5.1 mmol/L  Glucose, capillary     Status: Abnormal   Collection Time: 06/13/17  8:30 AM  Result Value Ref Range   Glucose-Capillary 152 (H) 65 - 99 mg/dL  CBC with Differential/Platelet     Status: Abnormal   Collection Time: 07/18/17  7:54 AM  Result Value Ref Range   WBC 10.3 3.6 - 11.0 K/uL   RBC 3.23 (L) 3.80 - 5.20 MIL/uL   Hemoglobin 10.3 (L) 12.0 - 16.0 g/dL   HCT 29.4 (L) 35.0 - 47.0 %   MCV 91.2 80.0 - 100.0 fL   MCH 31.9 26.0 - 34.0 pg   MCHC 35.0 32.0 - 36.0 g/dL   RDW 15.2 (H) 11.5 - 14.5 %   Platelets 183 150 - 440 K/uL   Neutrophils Relative % 62 %   Neutro Abs 6.5 1.4 - 6.5 K/uL   Lymphocytes Relative 21 %   Lymphs Abs 2.1 1.0 - 3.6 K/uL   Monocytes Relative 6 %   Monocytes Absolute 0.6 0.2 - 0.9 K/uL   Eosinophils Relative 10 %   Eosinophils Absolute 1.0 (H) 0 - 0.7 K/uL   Basophils Relative 1 %   Basophils Absolute 0.1 0 - 0.1 K/uL  Comprehensive  metabolic panel     Status: Abnormal   Collection Time: 07/18/17  7:54 AM  Result Value Ref Range   Sodium 136 135 - 145 mmol/L   Potassium 3.4 (L) 3.5 - 5.1 mmol/L   Chloride 95 (L) 101 - 111 mmol/L   CO2 30 22 - 32 mmol/L   Glucose, Bld 161 (H) 65 - 99 mg/dL   BUN 28 (H) 6 - 20 mg/dL   Creatinine, Ser 5.00 (H) 0.44 - 1.00 mg/dL   Calcium 9.2 8.9 - 10.3 mg/dL   Total Protein 7.9 6.5 - 8.1 g/dL   Albumin 4.0 3.5 - 5.0 g/dL   AST 14 (L) 15 - 41 U/L   ALT 10 (L) 14 - 54 U/L   Alkaline Phosphatase 79 38 - 126 U/L   Total Bilirubin 0.8 0.3 - 1.2 mg/dL   GFR calc non Af Amer 8 (L) >60 mL/min   GFR calc Af Amer 10 (L) >60 mL/min    Comment: (NOTE) The eGFR has been calculated using the CKD EPI equation. This calculation has not been validated in all clinical situations. eGFR's persistently <60 mL/min signify possible Chronic Kidney Disease.    Anion gap 11 5 - 15    Radiology No results found.    Assessment/Plan  Hyperlipidemia lipid control important in reducing the progression of atherosclerotic disease. Continue statin therapy   Type 2 diabetes mellitus with end-stage renal disease (HCC) blood glucose control important in reducing the progression of atherosclerotic disease. Also, involved in wound healing. On appropriate medications.   BP (high blood pressure) blood pressure control important in reducing the progression of atherosclerotic disease. On appropriate oral medications.   Atherosclerosis  of native arteries of the extremities with gangrene ([place]) Her noninvasive studies today demonstrate marked improvement in her left lower extremity perfusion now with a digital pressure of 70. Previously we did not detect any digital pressure. Her vessels are noncompressible bilaterally. Her right digital pressure is 79. Her waveforms are now quite good bilaterally. She still has dry gangrenous changes to the tip of the left great toe, but overall this is much improved. I  suspect this area will scab over and she would not need any significant amputation with only the loss of the distal aspect of the left great toe expected. I'll plan to see her back in about 3 months with noninvasive studies.    Leotis Pain, MD  07/26/2017 3:39 PM    This note was created with Dragon medical transcription system.  Any errors from dictation are purely unintentional

## 2017-07-26 NOTE — Assessment & Plan Note (Signed)
blood glucose control important in reducing the progression of atherosclerotic disease. Also, involved in wound healing. On appropriate medications.  

## 2017-07-26 NOTE — Assessment & Plan Note (Signed)
lipid control important in reducing the progression of atherosclerotic disease. Continue statin therapy  

## 2017-07-26 NOTE — Assessment & Plan Note (Signed)
Her noninvasive studies today demonstrate marked improvement in her left lower extremity perfusion now with a digital pressure of 70. Previously we did not detect any digital pressure. Her vessels are noncompressible bilaterally. Her right digital pressure is 79. Her waveforms are now quite good bilaterally. She still has dry gangrenous changes to the tip of the left great toe, but overall this is much improved. I suspect this area will scab over and she would not need any significant amputation with only the loss of the distal aspect of the left great toe expected. I'll plan to see her back in about 3 months with noninvasive studies.

## 2017-07-26 NOTE — Assessment & Plan Note (Signed)
blood pressure control important in reducing the progression of atherosclerotic disease. On appropriate oral medications.  

## 2017-07-26 NOTE — Patient Instructions (Signed)

## 2017-08-02 ENCOUNTER — Encounter: Payer: Self-pay | Admitting: *Deleted

## 2017-08-04 ENCOUNTER — Ambulatory Visit: Payer: Medicare (Managed Care) | Admitting: Anesthesiology

## 2017-08-04 ENCOUNTER — Ambulatory Visit
Admission: RE | Admit: 2017-08-04 | Discharge: 2017-08-04 | Disposition: A | Discharge status: Home / Self Care | Payer: Medicare (Managed Care) | Source: Ambulatory Visit | Attending: Ophthalmology | Admitting: Ophthalmology

## 2017-08-04 ENCOUNTER — Encounter
Admission: RE | Disposition: A | Discharge status: Home / Self Care | Payer: Self-pay | Source: Ambulatory Visit | Attending: Ophthalmology

## 2017-08-04 ENCOUNTER — Encounter: Payer: Self-pay | Admitting: Emergency Medicine

## 2017-08-04 DIAGNOSIS — R0601 Orthopnea: Secondary | ICD-10-CM | POA: Diagnosis not present

## 2017-08-04 DIAGNOSIS — Z8673 Personal history of transient ischemic attack (TIA), and cerebral infarction without residual deficits: Secondary | ICD-10-CM | POA: Insufficient documentation

## 2017-08-04 DIAGNOSIS — R001 Bradycardia, unspecified: Secondary | ICD-10-CM | POA: Diagnosis not present

## 2017-08-04 DIAGNOSIS — F419 Anxiety disorder, unspecified: Secondary | ICD-10-CM | POA: Diagnosis not present

## 2017-08-04 DIAGNOSIS — K219 Gastro-esophageal reflux disease without esophagitis: Secondary | ICD-10-CM | POA: Insufficient documentation

## 2017-08-04 DIAGNOSIS — E1122 Type 2 diabetes mellitus with diabetic chronic kidney disease: Secondary | ICD-10-CM | POA: Insufficient documentation

## 2017-08-04 DIAGNOSIS — H42 Glaucoma in diseases classified elsewhere: Secondary | ICD-10-CM | POA: Insufficient documentation

## 2017-08-04 DIAGNOSIS — J449 Chronic obstructive pulmonary disease, unspecified: Secondary | ICD-10-CM | POA: Insufficient documentation

## 2017-08-04 DIAGNOSIS — Z79899 Other long term (current) drug therapy: Secondary | ICD-10-CM | POA: Insufficient documentation

## 2017-08-04 DIAGNOSIS — N186 End stage renal disease: Secondary | ICD-10-CM | POA: Diagnosis not present

## 2017-08-04 DIAGNOSIS — F40240 Claustrophobia: Secondary | ICD-10-CM | POA: Insufficient documentation

## 2017-08-04 DIAGNOSIS — I132 Hypertensive heart and chronic kidney disease with heart failure and with stage 5 chronic kidney disease, or end stage renal disease: Secondary | ICD-10-CM | POA: Diagnosis not present

## 2017-08-04 DIAGNOSIS — Z888 Allergy status to other drugs, medicaments and biological substances status: Secondary | ICD-10-CM | POA: Insufficient documentation

## 2017-08-04 DIAGNOSIS — Z950 Presence of cardiac pacemaker: Secondary | ICD-10-CM | POA: Insufficient documentation

## 2017-08-04 DIAGNOSIS — E1139 Type 2 diabetes mellitus with other diabetic ophthalmic complication: Secondary | ICD-10-CM | POA: Diagnosis present

## 2017-08-04 DIAGNOSIS — Z7982 Long term (current) use of aspirin: Secondary | ICD-10-CM | POA: Insufficient documentation

## 2017-08-04 DIAGNOSIS — Z992 Dependence on renal dialysis: Secondary | ICD-10-CM | POA: Diagnosis not present

## 2017-08-04 DIAGNOSIS — E7800 Pure hypercholesterolemia, unspecified: Secondary | ICD-10-CM | POA: Insufficient documentation

## 2017-08-04 DIAGNOSIS — I509 Heart failure, unspecified: Secondary | ICD-10-CM | POA: Diagnosis not present

## 2017-08-04 DIAGNOSIS — Z87891 Personal history of nicotine dependence: Secondary | ICD-10-CM | POA: Insufficient documentation

## 2017-08-04 DIAGNOSIS — Z7984 Long term (current) use of oral hypoglycemic drugs: Secondary | ICD-10-CM | POA: Insufficient documentation

## 2017-08-04 DIAGNOSIS — Z951 Presence of aortocoronary bypass graft: Secondary | ICD-10-CM | POA: Insufficient documentation

## 2017-08-04 HISTORY — DX: Pain, unspecified: R52

## 2017-08-04 HISTORY — DX: Unspecified background retinopathy: H35.00

## 2017-08-04 HISTORY — PX: INSERTION OF AHMED VALVE: SHX6254

## 2017-08-04 HISTORY — DX: Claustrophobia: F40.240

## 2017-08-04 HISTORY — DX: Ataxic gait: R26.0

## 2017-08-04 HISTORY — DX: Personal history of urinary calculi: Z87.442

## 2017-08-04 LAB — GLUCOSE, CAPILLARY: GLUCOSE-CAPILLARY: 197 mg/dL — AB (ref 65–99)

## 2017-08-04 LAB — POCT I-STAT 4, (NA,K, GLUC, HGB,HCT)
Glucose, Bld: 206 mg/dL — ABNORMAL HIGH (ref 65–99)
HEMATOCRIT: 33 % — AB (ref 36.0–46.0)
Hemoglobin: 11.2 g/dL — ABNORMAL LOW (ref 12.0–15.0)
Potassium: 4 mmol/L (ref 3.5–5.1)
Sodium: 140 mmol/L (ref 135–145)

## 2017-08-04 SURGERY — INSERTION, GLAUCOMA VALVE, AHMED
Anesthesia: Monitor Anesthesia Care | Site: Eye | Laterality: Left | Wound class: Clean

## 2017-08-04 MED ORDER — LIDOCAINE HCL (PF) 4 % IJ SOLN
INTRAMUSCULAR | Status: AC
  Filled 2017-08-04: qty 5

## 2017-08-04 MED ORDER — MIDAZOLAM HCL 2 MG/2ML IJ SOLN
INTRAMUSCULAR | Status: DC | PRN
  Administered 2017-08-04: 1 mg via INTRAVENOUS
  Administered 2017-08-04: 2 mg via INTRAVENOUS

## 2017-08-04 MED ORDER — LIDOCAINE HCL (PF) 4 % IJ SOLN
INTRAMUSCULAR | Status: DC | PRN
  Administered 2017-08-04: 11:00:00 via OPHTHALMIC

## 2017-08-04 MED ORDER — HYALURONIDASE HUMAN 150 UNIT/ML IJ SOLN
INTRAMUSCULAR | Status: AC
  Filled 2017-08-04: qty 1

## 2017-08-04 MED ORDER — SODIUM HYALURONATE 10 MG/ML IO SOLN
INTRAOCULAR | Status: DC | PRN
  Administered 2017-08-04: 0.55 mL via INTRAOCULAR

## 2017-08-04 MED ORDER — MOXIFLOXACIN HCL 0.5 % OP SOLN
1.0000 [drp] | OPHTHALMIC | Status: AC
  Administered 2017-08-04 (×3): 1 [drp] via OPHTHALMIC

## 2017-08-04 MED ORDER — NEOMYCIN-POLYMYXIN-DEXAMETH 3.5-10000-0.1 OP OINT
TOPICAL_OINTMENT | OPHTHALMIC | Status: AC
  Filled 2017-08-04: qty 3.5

## 2017-08-04 MED ORDER — MIDAZOLAM HCL 2 MG/2ML IJ SOLN
INTRAMUSCULAR | Status: AC
  Filled 2017-08-04: qty 2

## 2017-08-04 MED ORDER — BUPIVACAINE HCL (PF) 0.75 % IJ SOLN
INTRAMUSCULAR | Status: AC
  Filled 2017-08-04: qty 10

## 2017-08-04 MED ORDER — MOXIFLOXACIN HCL 0.5 % OP SOLN
OPHTHALMIC | Status: DC | PRN
  Administered 2017-08-04: 0.2 mL via OPHTHALMIC

## 2017-08-04 MED ORDER — SODIUM CHLORIDE 0.9 % IV SOLN
INTRAVENOUS | Status: DC
  Administered 2017-08-04: 09:00:00 via INTRAVENOUS

## 2017-08-04 MED ORDER — ALFENTANIL 500 MCG/ML IJ INJ
INJECTION | INTRAVENOUS | Status: DC | PRN
  Administered 2017-08-04 (×4): 250 ug via INTRAVENOUS

## 2017-08-04 MED ORDER — MOXIFLOXACIN HCL 0.5 % OP SOLN
OPHTHALMIC | Status: AC
  Administered 2017-08-04: 1 [drp] via OPHTHALMIC
  Filled 2017-08-04: qty 6

## 2017-08-04 SURGICAL SUPPLY — 25 items
ALLOGRAFT TUTOPLST SCER0.5X1.0 (Tissue) ×1 IMPLANT
BANDAGE EYE OVAL (MISCELLANEOUS) ×6 IMPLANT
BLADE SURG MINI STRL (BLADE) ×3 IMPLANT
CORNEAL LIGHT SHIELD (MISCELLANEOUS) ×3
ERASER HMR WETFIELD 18G (MISCELLANEOUS) ×3 IMPLANT
GLOVE BIO SURGEON STRL SZ8 (GLOVE) ×3 IMPLANT
GLOVE SURG LX 6.5 MICRO (GLOVE) ×2
GLOVE SURG LX 7.5 STRW (GLOVE) ×2
GLOVE SURG LX STRL 6.5 MICRO (GLOVE) ×1 IMPLANT
GLOVE SURG LX STRL 7.5 STRW (GLOVE) ×1 IMPLANT
GOWN STRL REUS W/ TWL LRG LVL3 (GOWN DISPOSABLE) ×1 IMPLANT
GOWN STRL REUS W/TWL LRG LVL3 (GOWN DISPOSABLE) ×2
KNIFE SIDECUT EYE (MISCELLANEOUS) ×3 IMPLANT
NEEDLE HYPO 27GX1-1/4 (NEEDLE) ×3 IMPLANT
PACK CATARACT (MISCELLANEOUS) ×3 IMPLANT
SHIELD LIGHT CORNEAL (MISCELLANEOUS) ×1 IMPLANT
SOL BAL SALT 15ML (MISCELLANEOUS) ×3
SOL PREP PVP 2OZ (MISCELLANEOUS) ×3
SOLUTION BAL SALT 15ML (MISCELLANEOUS) ×1 IMPLANT
SOLUTION PREP PVP 2OZ (MISCELLANEOUS) ×1 IMPLANT
SUT ETHILON 10 0 CS140 6 (SUTURE) ×3 IMPLANT
SUT VICRYL  9 0 (SUTURE) ×2
SUT VICRYL 9 0 (SUTURE) ×1 IMPLANT
TUTOPLAST SCIERA 0.5X1.0 (Tissue) ×3 IMPLANT
VALVE GLAUCOMA AHMED (Prosthesis & Implant Heart) ×3 IMPLANT

## 2017-08-04 NOTE — Transfer of Care (Signed)
Immediate Anesthesia Transfer of Care Note  Patient: Nichole Hurst  Procedure(s) Performed: Procedure(s): INSERTION OF AHMED VALVE (Left)  Patient Location: PACU and Short Stay  Anesthesia Type:MAC  Level of Consciousness: awake, alert  and oriented  Airway & Oxygen Therapy: Patient Spontanous Breathing  Post-op Assessment: Report given to RN and Post -op Vital signs reviewed and stable  Post vital signs: Reviewed and stable  Last Vitals:  Vitals:   08/04/17 0909 08/04/17 1210  BP: (!) 185/61 (!) 146/64  Pulse: 73 71  Resp: 17   Temp: (!) 35.8 C (!) 36.2 C  SpO2: 98% 99%    Last Pain:  Vitals:   08/04/17 1210  TempSrc: Temporal      Patients Stated Pain Goal: 0 (06/21/87 7195)  Complications: No apparent anesthesia complications

## 2017-08-04 NOTE — Op Note (Signed)
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     GLAUCOMA IMPLANT:   Implant Name Type Inv. Item Serial No. Manufacturer Lot No. LRB No. Used  VALVE GLAUCOMA AHMED - [id number] Prosthesis & Implant Heart VALVE GLAUCOMA AHMED [id number] [employer] [id number] Left 1  TUTOPLAST SCIERA 0.5X1.0 - [id number] Tissue TUTOPLAST SCIERA 0.5X1.0 [id number] RIT [id number] Left 1     PROCEDURE: Tube shunt with Ahmed glaucoma valve and Tutoplast.    SURGEON: Deirdre Evener, MD   ANESTHESIA: Retrobulbar block, Xylocaine and bupivacaine.   PROCEDURE: The patient was identified in the holding room and transported to the operating suite and placed in the supine position underneath the operating microscope. The left eye was identified as the operative eye and a retrobulbar block of Xylocaine and bupivacaine was administered under intravenous sedation. It was then prepped and draped in the usual sterile ophthalmic fashion.   A conjunctival peritomy was made 3 mm posterior to the limbus from the 12:00 o'clock to  2:30 o'clock positions. Hemostasis was achieved with wet field cautery. Westcott scissors were used with blunt and sharp dissection to dissect a scleral pocket underneath conjunctiva and Tenons in the superotemporal quadrant. A muscle hook was used to isolate the superior rectus muscle. A 5-0 silk bridle suture was used to isolate this muscle and provide traction throughout the procedure.   A caliper was used to measure a position 10 mm posterior to the limbus in the superotemporal quadrant. Two 8-0 nylon sutures were placed at this distance in order to anchor the Ahmed glaucoma valve, model FP7. The Ahmed glaucoma valve was inspected and its tube was cannulated with a 27-gauge cannula with balanced salt solution. Balanced salt  solution was then injected into the glaucoma valve to prime it. It was found to be in good working condition. The Ahmed valve was then placed into the superotemporal pocket underneath the conjunctiva and Tenons. It was sutured with the preplaced 8-0 nylon sutures at a position 10 mm posterior to the limbus. This was verified after its placement with a caliper. The tube end was then cut with an anterior bevel to position into the anterior chamber. A paracentesis incision was made through clear cornea at the 3:00 o'clock position. The anterior chamber was filled with Provisc. A 22-gauge needle was then used to enter the anterior chamber in the superotemporal quadrant near the limbus. The tube was then placed into the anterior chamber parallel with the iris. There was no corneal or iris touch. A Tutoplast graft was then cut to fit, 0.6 x 1.0 cm from the limbus over the tube to the area of the plate. This was secured with 4 interrupted 8-0 nylon sutures. The anterior chamber was filled with balanced salt solution and the incision was noted to be watertight. Provisc was evacuated from the eye during this. The eye was noted to lower to a low physiologic pressure after being instilled with balanced salt solution. The conjunctiva and Tenons were closed at the limbus using running 9-0 Vicryl suture. Additional balanced salt solution was placed into the anterior chamber. The pressure quickly reduced to a low physiologic pressure of about 10. There were no wound leaks noted. The conjunctival incision was tight and there was bleb formation. Topical Maxitrol ointment was applied to the eye. The eye was patched and shielded. The patient was taken to  the recovery room in stable condition without complications of anesthesia or surgery.     Nichole Hurst 08/04/2017, 12:06 PM

## 2017-08-04 NOTE — Discharge Instructions (Signed)
Eye Surgery Discharge Instructions ° °Expect mild scratchy sensation or mild soreness. °DO NOT RUB YOUR EYE! ° °The day of surgery: °• Minimal physical activity, but bed rest is not required °• No reading, computer work, or close hand work °• No bending, lifting, or straining. °• May watch TV ° °For 24 hours: °• No driving, legal decisions, or alcoholic beverages °• Safety precautions °• Eat anything you prefer: It is better to start with liquids, then soup then solid foods. °• _____ Eye patch should be worn until postoperative exam tomorrow. °• ____ Solar shield eyeglasses should be worn for comfort in the sunlight/patch while sleeping ° °Resume all regular medications including aspirin or Coumadin if these were discontinued prior to surgery. °You may shower, bathe, shave, or wash your hair. °Tylenol may be taken for mild discomfort. ° °Call your doctor if you experience significant pain, nausea, or vomiting, fever > 101 or other signs of infection. [phone] or [phone] °Specific instructions: ° °Follow-up Information   ° Brasington, Chadwick, MD Follow up on 08/05/2017.   °Specialty:  Ophthalmology °Why:  follow up appointment in the office at 9:15 am °Contact information: °[address]   °[place] [address] °[phone] ° ° °  °  °  ° °

## 2017-08-04 NOTE — Anesthesia Preprocedure Evaluation (Addendum)
Anesthesia Evaluation  Patient identified by MRN, date of birth, ID band Patient awake    Reviewed: Allergy & Precautions, NPO status , Patient's Chart, lab work & pertinent test results  History of Anesthesia Complications Negative for: history of anesthetic complications  Airway Mallampati: III       Dental   Pulmonary COPD, former smoker,           Cardiovascular hypertension, +CHF and + Orthopnea  + dysrhythmias (bradycardia) + pacemaker      Neuro/Psych Anxiety CVA (no symptoms), No Residual Symptoms    GI/Hepatic GERD  Medicated and Poorly Controlled,  Endo/Other  diabetes, Type 2, Oral Hypoglycemic Agents  Renal/GU Dialysis and ESRFRenal disease     Musculoskeletal   Abdominal   Peds  Hematology  (+) anemia ,   Anesthesia Other Findings   Reproductive/Obstetrics                            Anesthesia Physical Anesthesia Plan  ASA: III  Anesthesia Plan: MAC   Post-op Pain Management:    Induction: Intravenous  PONV Risk Score and Plan:   Airway Management Planned: Nasal Cannula  Additional Equipment:   Intra-op Plan:   Post-operative Plan:   Informed Consent: I have reviewed the patients History and Physical, chart, labs and discussed the procedure including the risks, benefits and alternatives for the proposed anesthesia with the patient or authorized representative who has indicated his/her understanding and acceptance.     Plan Discussed with:   Anesthesia Plan Comments:         Anesthesia Quick Evaluation

## 2017-08-04 NOTE — Anesthesia Postprocedure Evaluation (Signed)
Anesthesia Post Note  Patient: Nichole Hurst  Procedure(s) Performed: Procedure(s) (LRB): INSERTION OF AHMED VALVE (Left)  Patient location during evaluation: Short Stay Anesthesia Type: MAC Level of consciousness: awake and alert and oriented Pain management: pain level controlled Vital Signs Assessment: post-procedure vital signs reviewed and stable Respiratory status: spontaneous breathing Cardiovascular status: stable Postop Assessment: no headache and adequate PO intake Anesthetic complications: no     Last Vitals:  Vitals:   08/04/17 0909 08/04/17 1210  BP: (!) 185/61 (!) 146/64  Pulse: 73 71  Resp: 17   Temp: (!) 35.8 C (!) 36.2 C  SpO2: 98% 99%    Last Pain:  Vitals:   08/04/17 1210  TempSrc: Temporal                 Lanora Manis

## 2017-08-04 NOTE — Anesthesia Post-op Follow-up Note (Signed)
Anesthesia QCDR form completed.        

## 2017-08-04 NOTE — Anesthesia Preprocedure Evaluation (Signed)
Anesthesia Evaluation  Patient identified by MRN, date of birth, ID band Patient awake    Reviewed: Allergy & Precautions, NPO status , Patient's Chart, lab work & pertinent test results  History of Anesthesia Complications Negative for: history of anesthetic complications  Airway Mallampati: III       Dental   Pulmonary COPD,  COPD inhaler, former smoker,           Cardiovascular hypertension, Pt. on medications and Pt. on home beta blockers + Peripheral Vascular Disease and +CHF  + dysrhythmias (bradycardia) + pacemaker      Neuro/Psych neg Seizures Anxiety CVA, No Residual Symptoms    GI/Hepatic Neg liver ROS, GERD  Medicated,  Endo/Other  diabetes, Type 2, Oral Hypoglycemic Agents  Renal/GU DialysisRenal disease     Musculoskeletal   Abdominal   Peds  Hematology   Anesthesia Other Findings   Reproductive/Obstetrics                             Anesthesia Physical Anesthesia Plan  ASA: III  Anesthesia Plan: MAC   Post-op Pain Management:    Induction: Intravenous  PONV Risk Score and Plan:   Airway Management Planned:   Additional Equipment:   Intra-op Plan:   Post-operative Plan:   Informed Consent: I have reviewed the patients History and Physical, chart, labs and discussed the procedure including the risks, benefits and alternatives for the proposed anesthesia with the patient or authorized representative who has indicated his/her understanding and acceptance.     Plan Discussed with:   Anesthesia Plan Comments:         Anesthesia Quick Evaluation

## 2017-08-04 NOTE — H&P (Signed)
The History and Physical notes are on paper, have been signed, and are to be scanned. The patient remains stable and unchanged from the H&P.   Previous H&P reviewed, patient examined, and there are no changes.  Nichole Hurst 08/04/2017 9:08 AM

## 2017-08-05 ENCOUNTER — Encounter: Payer: Self-pay | Admitting: Ophthalmology

## 2017-08-08 ENCOUNTER — Inpatient Hospital Stay
Admission: EM | Admit: 2017-08-08 | Discharge: 2017-08-09 | DRG: 291 | Disposition: A | Discharge status: Home / Self Care | Payer: Medicare (Managed Care) | Attending: Internal Medicine | Admitting: Internal Medicine

## 2017-08-08 ENCOUNTER — Emergency Department: Payer: Medicare (Managed Care)

## 2017-08-08 ENCOUNTER — Encounter: Payer: Self-pay | Admitting: Emergency Medicine

## 2017-08-08 DIAGNOSIS — Z992 Dependence on renal dialysis: Secondary | ICD-10-CM

## 2017-08-08 DIAGNOSIS — Z7902 Long term (current) use of antithrombotics/antiplatelets: Secondary | ICD-10-CM

## 2017-08-08 DIAGNOSIS — Z833 Family history of diabetes mellitus: Secondary | ICD-10-CM

## 2017-08-08 DIAGNOSIS — N186 End stage renal disease: Secondary | ICD-10-CM

## 2017-08-08 DIAGNOSIS — Z9049 Acquired absence of other specified parts of digestive tract: Secondary | ICD-10-CM

## 2017-08-08 DIAGNOSIS — I2510 Atherosclerotic heart disease of native coronary artery without angina pectoris: Secondary | ICD-10-CM | POA: Diagnosis not present

## 2017-08-08 DIAGNOSIS — E1151 Type 2 diabetes mellitus with diabetic peripheral angiopathy without gangrene: Secondary | ICD-10-CM | POA: Diagnosis present

## 2017-08-08 DIAGNOSIS — D631 Anemia in chronic kidney disease: Secondary | ICD-10-CM | POA: Diagnosis present

## 2017-08-08 DIAGNOSIS — I248 Other forms of acute ischemic heart disease: Secondary | ICD-10-CM | POA: Diagnosis present

## 2017-08-08 DIAGNOSIS — R0602 Shortness of breath: Secondary | ICD-10-CM

## 2017-08-08 DIAGNOSIS — Z87891 Personal history of nicotine dependence: Secondary | ICD-10-CM

## 2017-08-08 DIAGNOSIS — R748 Abnormal levels of other serum enzymes: Secondary | ICD-10-CM | POA: Diagnosis not present

## 2017-08-08 DIAGNOSIS — I5033 Acute on chronic diastolic (congestive) heart failure: Secondary | ICD-10-CM | POA: Diagnosis present

## 2017-08-08 DIAGNOSIS — E1122 Type 2 diabetes mellitus with diabetic chronic kidney disease: Secondary | ICD-10-CM | POA: Diagnosis present

## 2017-08-08 DIAGNOSIS — I5031 Acute diastolic (congestive) heart failure: Secondary | ICD-10-CM | POA: Diagnosis not present

## 2017-08-08 DIAGNOSIS — I132 Hypertensive heart and chronic kidney disease with heart failure and with stage 5 chronic kidney disease, or end stage renal disease: Secondary | ICD-10-CM | POA: Diagnosis present

## 2017-08-08 DIAGNOSIS — F40240 Claustrophobia: Secondary | ICD-10-CM | POA: Diagnosis present

## 2017-08-08 DIAGNOSIS — Z951 Presence of aortocoronary bypass graft: Secondary | ICD-10-CM

## 2017-08-08 DIAGNOSIS — Z950 Presence of cardiac pacemaker: Secondary | ICD-10-CM

## 2017-08-08 DIAGNOSIS — E785 Hyperlipidemia, unspecified: Secondary | ICD-10-CM | POA: Diagnosis present

## 2017-08-08 DIAGNOSIS — K219 Gastro-esophageal reflux disease without esophagitis: Secondary | ICD-10-CM | POA: Diagnosis present

## 2017-08-08 DIAGNOSIS — J811 Chronic pulmonary edema: Secondary | ICD-10-CM | POA: Diagnosis present

## 2017-08-08 DIAGNOSIS — Z9841 Cataract extraction status, right eye: Secondary | ICD-10-CM

## 2017-08-08 DIAGNOSIS — Z79899 Other long term (current) drug therapy: Secondary | ICD-10-CM

## 2017-08-08 DIAGNOSIS — I5021 Acute systolic (congestive) heart failure: Secondary | ICD-10-CM

## 2017-08-08 DIAGNOSIS — J810 Acute pulmonary edema: Secondary | ICD-10-CM

## 2017-08-08 DIAGNOSIS — I10 Essential (primary) hypertension: Secondary | ICD-10-CM

## 2017-08-08 DIAGNOSIS — R631 Polydipsia: Secondary | ICD-10-CM | POA: Diagnosis present

## 2017-08-08 DIAGNOSIS — Z90710 Acquired absence of both cervix and uterus: Secondary | ICD-10-CM

## 2017-08-08 DIAGNOSIS — I272 Pulmonary hypertension, unspecified: Secondary | ICD-10-CM | POA: Diagnosis present

## 2017-08-08 DIAGNOSIS — J9601 Acute respiratory failure with hypoxia: Secondary | ICD-10-CM | POA: Diagnosis present

## 2017-08-08 DIAGNOSIS — N2581 Secondary hyperparathyroidism of renal origin: Secondary | ICD-10-CM | POA: Diagnosis present

## 2017-08-08 DIAGNOSIS — I161 Hypertensive emergency: Secondary | ICD-10-CM | POA: Diagnosis present

## 2017-08-08 DIAGNOSIS — J439 Emphysema, unspecified: Secondary | ICD-10-CM | POA: Diagnosis present

## 2017-08-08 DIAGNOSIS — Z888 Allergy status to other drugs, medicaments and biological substances status: Secondary | ICD-10-CM

## 2017-08-08 DIAGNOSIS — I340 Nonrheumatic mitral (valve) insufficiency: Secondary | ICD-10-CM | POA: Diagnosis not present

## 2017-08-08 DIAGNOSIS — Z8673 Personal history of transient ischemic attack (TIA), and cerebral infarction without residual deficits: Secondary | ICD-10-CM

## 2017-08-08 DIAGNOSIS — Z86010 Personal history of colonic polyps: Secondary | ICD-10-CM

## 2017-08-08 DIAGNOSIS — R011 Cardiac murmur, unspecified: Secondary | ICD-10-CM | POA: Diagnosis present

## 2017-08-08 DIAGNOSIS — E11319 Type 2 diabetes mellitus with unspecified diabetic retinopathy without macular edema: Secondary | ICD-10-CM | POA: Diagnosis present

## 2017-08-08 DIAGNOSIS — I509 Heart failure, unspecified: Secondary | ICD-10-CM

## 2017-08-08 DIAGNOSIS — H5712 Ocular pain, left eye: Secondary | ICD-10-CM | POA: Diagnosis present

## 2017-08-08 DIAGNOSIS — Z961 Presence of intraocular lens: Secondary | ICD-10-CM | POA: Diagnosis present

## 2017-08-08 DIAGNOSIS — F0390 Unspecified dementia without behavioral disturbance: Secondary | ICD-10-CM | POA: Diagnosis present

## 2017-08-08 DIAGNOSIS — Z7982 Long term (current) use of aspirin: Secondary | ICD-10-CM

## 2017-08-08 DIAGNOSIS — I081 Rheumatic disorders of both mitral and tricuspid valves: Secondary | ICD-10-CM | POA: Diagnosis present

## 2017-08-08 DIAGNOSIS — Z87442 Personal history of urinary calculi: Secondary | ICD-10-CM

## 2017-08-08 HISTORY — DX: Dependence on renal dialysis: Z99.2

## 2017-08-08 HISTORY — DX: Anemia in other chronic diseases classified elsewhere: D63.8

## 2017-08-08 HISTORY — DX: Type 2 diabetes mellitus with unspecified complications: E11.8

## 2017-08-08 HISTORY — DX: Atherosclerotic heart disease of native coronary artery without angina pectoris: I25.10

## 2017-08-08 HISTORY — DX: Dependence on renal dialysis: N18.6

## 2017-08-08 LAB — TROPONIN I
TROPONIN I: 1.02 ng/mL — AB (ref <0.03)
TROPONIN I: 0.79 ng/mL — AB (ref <0.03)
Troponin I: 1.07 ng/mL (ref <0.03)

## 2017-08-08 LAB — CBC WITH DIFFERENTIAL/PLATELET
BASOS ABS: 0.1 10*3/uL (ref 0–0.1)
BASOS PCT: 2 %
Eosinophils Absolute: 0.4 10*3/uL (ref 0–0.7)
Eosinophils Relative: 4 %
HEMATOCRIT: 30.3 % — AB (ref 35.0–47.0)
Hemoglobin: 10.1 g/dL — ABNORMAL LOW (ref 12.0–16.0)
Lymphocytes Relative: 12 %
Lymphs Abs: 1.2 10*3/uL (ref 1.0–3.6)
MCH: 30.6 pg (ref 26.0–34.0)
MCHC: 33.3 g/dL (ref 32.0–36.0)
MCV: 92 fL (ref 80.0–100.0)
Monocytes Absolute: 0.4 10*3/uL (ref 0.2–0.9)
Monocytes Relative: 4 %
NEUTROS ABS: 8 10*3/uL — AB (ref 1.4–6.5)
NEUTROS PCT: 80 %
Platelets: 207 10*3/uL (ref 150–440)
RBC: 3.29 MIL/uL — ABNORMAL LOW (ref 3.80–5.20)
RDW: 15.6 % — AB (ref 11.5–14.5)
WBC: 10.1 10*3/uL (ref 3.6–11.0)

## 2017-08-08 LAB — COMPREHENSIVE METABOLIC PANEL
ALT: 8 U/L — ABNORMAL LOW (ref 14–54)
ANION GAP: 15 (ref 5–15)
AST: 53 U/L — ABNORMAL HIGH (ref 15–41)
Albumin: 4.2 g/dL (ref 3.5–5.0)
Alkaline Phosphatase: 73 U/L (ref 38–126)
BILIRUBIN TOTAL: 0.7 mg/dL (ref 0.3–1.2)
BUN: 53 mg/dL — AB (ref 6–20)
CO2: 27 mmol/L (ref 22–32)
Calcium: 9.5 mg/dL (ref 8.9–10.3)
Chloride: 95 mmol/L — ABNORMAL LOW (ref 101–111)
Creatinine, Ser: 7.57 mg/dL — ABNORMAL HIGH (ref 0.44–1.00)
GFR, EST AFRICAN AMERICAN: 6 mL/min — AB (ref >60)
GFR, EST NON AFRICAN AMERICAN: 5 mL/min — AB (ref >60)
Glucose, Bld: 292 mg/dL — ABNORMAL HIGH (ref 65–99)
POTASSIUM: 4.4 mmol/L (ref 3.5–5.1)
Sodium: 137 mmol/L (ref 135–145)
TOTAL PROTEIN: 7.9 g/dL (ref 6.5–8.1)

## 2017-08-08 LAB — GLUCOSE, CAPILLARY
GLUCOSE-CAPILLARY: 125 mg/dL — AB (ref 65–99)
GLUCOSE-CAPILLARY: 180 mg/dL — AB (ref 65–99)
GLUCOSE-CAPILLARY: 221 mg/dL — AB (ref 65–99)

## 2017-08-08 LAB — MRSA PCR SCREENING: MRSA BY PCR: NEGATIVE

## 2017-08-08 LAB — PROTIME-INR
INR: 1.19
Prothrombin Time: 15 seconds (ref 11.4–15.2)

## 2017-08-08 LAB — LACTIC ACID, PLASMA: LACTIC ACID, VENOUS: 1.3 mmol/L (ref 0.5–1.9)

## 2017-08-08 MED ORDER — METOPROLOL SUCCINATE 25 MG PO CS24: 25.0000 mg | EXTENDED_RELEASE_CAPSULE | Freq: Every day | ORAL | Status: DC

## 2017-08-08 MED ORDER — METOPROLOL SUCCINATE ER 25 MG PO TB24
25.0000 mg | ORAL_TABLET | Freq: Every day | ORAL | Status: DC
  Administered 2017-08-08: 25 mg via ORAL
  Filled 2017-08-08: qty 1

## 2017-08-08 MED ORDER — TICAGRELOR 60 MG PO TABS
60.0000 mg | ORAL_TABLET | Freq: Two times a day (BID) | ORAL | Status: DC
  Administered 2017-08-08 – 2017-08-09 (×2): 60 mg via ORAL
  Filled 2017-08-08 (×4): qty 1

## 2017-08-08 MED ORDER — LABETALOL HCL 5 MG/ML IV SOLN
10.0000 mg | Freq: Once | INTRAVENOUS | Status: AC
  Administered 2017-08-08: 10 mg via INTRAVENOUS
  Filled 2017-08-08: qty 4

## 2017-08-08 MED ORDER — BRINZOLAMIDE-BRIMONIDINE 1-0.2 % OP SUSP: 1.0000 [drp] | Freq: Three times a day (TID) | OPHTHALMIC | Status: DC

## 2017-08-08 MED ORDER — INSULIN ASPART 100 UNIT/ML ~~LOC~~ SOLN
0.0000 [IU] | Freq: Three times a day (TID) | SUBCUTANEOUS | Status: DC
  Administered 2017-08-08: 2 [IU] via SUBCUTANEOUS
  Administered 2017-08-09: 1 [IU] via SUBCUTANEOUS
  Administered 2017-08-09: 3 [IU] via SUBCUTANEOUS
  Filled 2017-08-08: qty 1
  Filled 2017-08-08 (×6): qty 0.09
  Filled 2017-08-08 (×2): qty 1
  Filled 2017-08-08: qty 0.09

## 2017-08-08 MED ORDER — POLYETHYLENE GLYCOL 3350 17 G PO PACK: 17.0000 g | PACK | Freq: Every day | ORAL | Status: DC | PRN

## 2017-08-08 MED ORDER — ACETAMINOPHEN 325 MG PO TABS
650.0000 mg | ORAL_TABLET | Freq: Four times a day (QID) | ORAL | Status: DC | PRN
  Administered 2017-08-08: 650 mg via ORAL
  Filled 2017-08-08: qty 2

## 2017-08-08 MED ORDER — NITROGLYCERIN 0.4 MG SL SUBL: 0.4000 mg | SUBLINGUAL_TABLET | SUBLINGUAL | Status: DC | PRN

## 2017-08-08 MED ORDER — ACETAMINOPHEN 650 MG RE SUPP: 650.0000 mg | Freq: Four times a day (QID) | RECTAL | Status: DC | PRN

## 2017-08-08 MED ORDER — LATANOPROST 0.005 % OP SOLN
1.0000 [drp] | Freq: Every day | OPHTHALMIC | Status: DC
  Administered 2017-08-08: 1 [drp] via OPHTHALMIC
  Filled 2017-08-08: qty 2.5

## 2017-08-08 MED ORDER — TRAMADOL HCL 50 MG PO TABS
50.0000 mg | ORAL_TABLET | Freq: Three times a day (TID) | ORAL | Status: DC | PRN
  Administered 2017-08-08 – 2017-08-09 (×2): 50 mg via ORAL
  Filled 2017-08-08 (×2): qty 1

## 2017-08-08 MED ORDER — HEPARIN SODIUM (PORCINE) 5000 UNIT/ML IJ SOLN
5000.0000 [IU] | Freq: Three times a day (TID) | INTRAMUSCULAR | Status: DC
  Administered 2017-08-08 – 2017-08-09 (×3): 5000 [IU] via SUBCUTANEOUS
  Filled 2017-08-08 (×3): qty 1

## 2017-08-08 MED ORDER — ONDANSETRON HCL 4 MG PO TABS: 4.0000 mg | ORAL_TABLET | Freq: Four times a day (QID) | ORAL | Status: DC | PRN

## 2017-08-08 MED ORDER — TIMOLOL HEMIHYDRATE 0.5 % OP SOLN: 1.0000 [drp] | Freq: Two times a day (BID) | OPHTHALMIC | Status: DC

## 2017-08-08 MED ORDER — INSULIN ASPART 100 UNIT/ML ~~LOC~~ SOLN
0.0000 [IU] | Freq: Every day | SUBCUTANEOUS | Status: DC
  Administered 2017-08-08: 2 [IU] via SUBCUTANEOUS
  Filled 2017-08-08: qty 1
  Filled 2017-08-08 (×3): qty 0.05

## 2017-08-08 MED ORDER — IRBESARTAN 150 MG PO TABS
150.0000 mg | ORAL_TABLET | Freq: Every day | ORAL | Status: DC
  Administered 2017-08-08: 150 mg via ORAL
  Filled 2017-08-08: qty 1

## 2017-08-08 MED ORDER — FERROUS SULFATE 325 (65 FE) MG PO TABS
325.0000 mg | ORAL_TABLET | Freq: Every day | ORAL | Status: DC
  Administered 2017-08-09: 325 mg via ORAL
  Filled 2017-08-08: qty 1

## 2017-08-08 MED ORDER — TIMOLOL MALEATE 0.5 % OP SOLN
1.0000 [drp] | Freq: Two times a day (BID) | OPHTHALMIC | Status: DC
  Administered 2017-08-08 – 2017-08-09 (×2): 1 [drp] via OPHTHALMIC
  Filled 2017-08-08: qty 5

## 2017-08-08 MED ORDER — ALBUTEROL SULFATE (2.5 MG/3ML) 0.083% IN NEBU: 2.5000 mg | INHALATION_SOLUTION | Freq: Four times a day (QID) | RESPIRATORY_TRACT | Status: DC | PRN

## 2017-08-08 MED ORDER — IPRATROPIUM-ALBUTEROL 20-100 MCG/ACT IN AERS: 2.0000 | INHALATION_SPRAY | Freq: Four times a day (QID) | RESPIRATORY_TRACT | Status: DC

## 2017-08-08 MED ORDER — MOXIFLOXACIN HCL 0.5 % OP SOLN
1.0000 [drp] | Freq: Four times a day (QID) | OPHTHALMIC | Status: DC
  Administered 2017-08-08 – 2017-08-09 (×4): 1 [drp] via OPHTHALMIC
  Filled 2017-08-08: qty 3

## 2017-08-08 MED ORDER — BRIMONIDINE TARTRATE 0.2 % OP SOLN
1.0000 [drp] | Freq: Three times a day (TID) | OPHTHALMIC | Status: DC
  Administered 2017-08-08 – 2017-08-09 (×3): 1 [drp] via OPHTHALMIC
  Filled 2017-08-08: qty 5

## 2017-08-08 MED ORDER — VITAMIN D (ERGOCALCIFEROL) 1.25 MG (50000 UNIT) PO CAPS
50000.0000 [IU] | ORAL_CAPSULE | ORAL | Status: DC
  Administered 2017-08-09: 50000 [IU] via ORAL
  Filled 2017-08-08: qty 1

## 2017-08-08 MED ORDER — CALCIUM ACETATE (PHOS BINDER) 667 MG PO CAPS
1334.0000 mg | ORAL_CAPSULE | Freq: Three times a day (TID) | ORAL | Status: DC
  Administered 2017-08-08 – 2017-08-09 (×4): 1334 mg via ORAL
  Filled 2017-08-08 (×4): qty 2

## 2017-08-08 MED ORDER — ONDANSETRON HCL 4 MG/2ML IJ SOLN
4.0000 mg | Freq: Four times a day (QID) | INTRAMUSCULAR | Status: DC | PRN
  Administered 2017-08-08: 4 mg via INTRAVENOUS
  Filled 2017-08-08: qty 2

## 2017-08-08 MED ORDER — LISINOPRIL 10 MG PO TABS: 10.0000 mg | ORAL_TABLET | Freq: Every day | ORAL | Status: DC

## 2017-08-08 MED ORDER — BRINZOLAMIDE 1 % OP SUSP
1.0000 [drp] | Freq: Three times a day (TID) | OPHTHALMIC | Status: DC
  Administered 2017-08-08 – 2017-08-09 (×3): 1 [drp] via OPHTHALMIC
  Filled 2017-08-08: qty 10

## 2017-08-08 MED ORDER — FLUTICASONE PROPIONATE 50 MCG/ACT NA SUSP
1.0000 | Freq: Every day | NASAL | Status: DC
  Administered 2017-08-08 – 2017-08-09 (×2): 1 via NASAL
  Filled 2017-08-08: qty 16

## 2017-08-08 MED ORDER — ASPIRIN EC 81 MG PO TBEC
81.0000 mg | DELAYED_RELEASE_TABLET | Freq: Every day | ORAL | Status: DC
  Administered 2017-08-08: 81 mg via ORAL
  Filled 2017-08-08: qty 1

## 2017-08-08 MED ORDER — FERROUS SULFATE ER 140 (45 FE) MG PO TBCR: 1.0000 | EXTENDED_RELEASE_TABLET | Freq: Every day | ORAL | Status: DC

## 2017-08-08 MED ORDER — ONDANSETRON HCL 4 MG PO TABS: 4.0000 mg | ORAL_TABLET | Freq: Three times a day (TID) | ORAL | Status: DC | PRN

## 2017-08-08 MED ORDER — DIFLUPREDNATE 0.05 % OP EMUL: 1.0000 [drp] | Freq: Four times a day (QID) | OPHTHALMIC | Status: DC

## 2017-08-08 MED ORDER — DIFLUPREDNATE 0.05 % OP EMUL
1.0000 [drp] | Freq: Four times a day (QID) | OPHTHALMIC | Status: DC
  Administered 2017-08-08 – 2017-08-09 (×4): 1 [drp] via OPHTHALMIC

## 2017-08-08 MED ORDER — METOPROLOL SUCCINATE ER 50 MG PO TB24
50.0000 mg | ORAL_TABLET | Freq: Every day | ORAL | Status: DC
  Administered 2017-08-09: 50 mg via ORAL
  Filled 2017-08-08: qty 1

## 2017-08-08 MED ORDER — IPRATROPIUM-ALBUTEROL 0.5-2.5 (3) MG/3ML IN SOLN
3.0000 mL | Freq: Four times a day (QID) | RESPIRATORY_TRACT | Status: DC
  Administered 2017-08-08 – 2017-08-09 (×3): 3 mL via RESPIRATORY_TRACT
  Filled 2017-08-08 (×4): qty 3

## 2017-08-08 MED ORDER — ATORVASTATIN CALCIUM 20 MG PO TABS
80.0000 mg | ORAL_TABLET | Freq: Every evening | ORAL | Status: DC
  Administered 2017-08-08: 80 mg via ORAL
  Filled 2017-08-08: qty 4

## 2017-08-08 NOTE — Progress Notes (Signed)
Inpatient Diabetes Program Recommendations  AACE/ADA: New Consensus Statement on Inpatient Glycemic Control (2015)  Target Ranges:  Prepandial:   less than 140 mg/dL      Peak postprandial:   less than 180 mg/dL (1-2 hours)      Critically ill patients:  140 - 180 mg/dL   Lab Results  Component Value Date   GLUCAP 197 (H) 08/04/2017   HGBA1C 8.3 06/24/2015   Results for Nichole, Hurst (MRN [id number]) as of 08/08/2017 12:29  Ref. Range 08/08/2017 06:05  Glucose Latest Ref Range: 65 - 99 mg/dL 409 (H)   Diabetes history: DM Outpatient Diabetes medications: None listed Current orders for Inpatient glycemic control: None noted Inpatient Diabetes Program Recommendations:   Text page sent to Dr. Allena Katz regarding potential need for CBG's and Novolog correction while patient is in the hospital.   Thanks, Beryl Meager, RN, BC-ADM Inpatient Diabetes Coordinator Pager [phone] (8a-5p)

## 2017-08-08 NOTE — Procedures (Signed)
Pt arrived to HDU via hospital transport staff at or about 1100 and was assessed prior to tx and found to have a LUA AVF, which was prepared per p&p and cannulated w 15 ga needles w/o issue; order includes RTD 3.25 hours on 3251 bath, BFR 400, DFR 800 and attempt to achieve 2 kg fluid removal as tolerated by pt; pt initiated at 1115 w/o issue; pt completed her entire tx and achieved target of 2 kg fluid removal w/o complication, all blood was rinsed back and needles aseptically de-cannulated and pressure dressings applied; hemostasis achieved w/o prolonged bleeding and pt was stable at the end of her tx and when leaving HDU under the care of transport staff; report called to primary RN

## 2017-08-08 NOTE — Consult Note (Signed)
Cardiology Consultation Note  Patient ID: Nichole Hurst, MRN: [id number], DOB/AGE: [id number] y.o. Admit date: 08/08/2017   Date of Consult: 08/08/2017 Primary Physician: Bobbye Morton, MD Primary Cardiologist: [facility] Requesting Physician: Dr. Allena Katz, MD  Chief Complaint: SOB Reason for Consult: Elevated troponin/abnormal EKG  HPI: Nichole Hurst is a 66 y.o. female who is being seen today for the evaluation of elevated troponin/abnomral EKG at the request of Dr. Allena Katz, MD. Patient has a h/o CAD s/p 2-vessel CABG ([id number]) in 05/2016, tachy-brady syndrome s/p SJM PPM in 05/2016, ESRD on HD (MWF), PAD s/p angioplasty of the left PTA/left ATA, anemia of chronic disease, DM2, HTN, and HLD who presented to [facility] with increased SOB.  Patient underwent 2-vessel CABG in 05/2016 at [facility] in the setting of undergoing a diagnostic LHC for pre-transplant cardiac evaluation that showed severe 2-vessel CAD as detailed below. Post-procedure course was complicated by tachy-brady syndrome requiring SJM PPM. She had been doing well from a cardiovascular standpoint.   Over the prior 2-3 days the patient was experiencing increased blood sugars at home. This was associated with polydipsia followed by increased SOB and the development of left-sided chest pain that is worse when she pushes on her chest. She has not missed any HD sessions. She does not follow with general cardiology at [facility]. She is uncertain what her home medications are. She reports a dry weight of 64 kg. She presented to her outpatient HD center this morning for her regularly scheduled dialysis and reported increased SOB with left-sided chest pain. She was sent to the ED for further evaluation.   Upon the patient's arrival to [facility] they were found to have BP 176/74 that trended to 214/94, HR 91 bpm, temp 98, oxygen saturation 100% on room air, weight 141 pounds. EKG as below. It is unclear how/if this EKG is changed from prior study at [facility] in 04/2017, as  that study mentioned worsened st/t changes (though no location). CXR showed CHF. Labs showed troponin 1.02, WBC 10.1, HGB 10.1, PLT 207, SCr 7.57, K+ 4.4, AST 53, lactic acid 1.3. Patient has received IV labetalol since admission given her BP. Cardiology asked to evaluate given elevated troponin and EKG changes.   Telemetry showed currently NSR 80s bpm. She had an episode of narrow complex tachycardia in the 120s to 130s bpm starting at 10:06, though she converted back to sinus rhythm while off telemetry, being back in sinus rhythm when telemetry was reconnected at 10:41 AM.   Currently in dialysis undergoing HD. She notes increased SOB with left-sided chest pain that is worse when she presses on her chest.   Past Medical History:  Diagnosis Date  . Anemia of chronic disease   . CAD (coronary artery disease)    a. s/p 2-V CABG 05/2016 (LIMA-LAD, VG-OM1)  . Carotid stenosis   . Charcot's gait    FOOT  . Claustrophobia   . Colon polyp   . Dementia   . Diabetes mellitus with complication (HCC)   . Emphysema/COPD (HCC)   . ESRD (end stage renal disease) on dialysis (HCC)    DIAYLISIS M/W/F  . Fractures    Lt foot  . GERD (gastroesophageal reflux disease)   . Glaucoma   . History of kidney stones   . Hyperlipidemia   . Hypertension   . Migraine   . Orthopnea   . Oxygen deficiency    AS NEEDED  . PAD (peripheral artery disease) (HCC)   . Pain    JOINTS  .  Presence of permanent cardiac pacemaker    [employer]. Jude   . Retinopathy    DIABETIC  . Stroke [facility]) 09/2015   Ischemic stroke with R sided hemiparesis      Most Recent Cardiac Studies: LHC 08/2015: HEMODYNAMICS: Aortic pressure was 120/70; LV pressure was 125; LVEDP 12.  There was no gradient between the left ventricle and aorta.   ANGIOGRAM/CORONARY ARTERIOGRAM: The left main coronary artery is 0.  The left anterior descending artery is 70-80. DIFFUSELY DIABETIC  The left circumflex artery is 70-80. DIABETIC DISEASE  DIFFUSELY  The right coronary artery is SMALL NONDOMINANT - OCCLUDED.  LEFT VENTRICULOGRAM: Left ventricular angiogram was done in the 30 RAO  projection and revealed normal left ventricular wall motion and systolic  function with an estimated ejection fraction of 60%. LVEDP was 14 mmHg.  IMPRESSION OF HEART CATHETERIZATION:  1. MULTIVESSEL SEVERE DISEASE WITH CX DOMINANCE - CABG VS SO DIFFUSE MAY  BE BEST TREATED MEDICALLY AS SHE IS ASYMPTOMATIC. 2. Normal left ventricular systolic function. LVEDP 14 mmHg. Ejection  fraction 60%.  Echo 04/2016: Left ventricular hypertrophy - mild  Normal left ventricular systolic function, ejection fraction > 55%  Mitral annular calcification  Degenerative mitral valve disease  Mitral regurgitation - mild  Dilated left atrium - mild  Aortic sclerosis  Normal right ventricular systolic function  Pericardial effusion - small   Surgical History:  Past Surgical History:  Procedure Laterality Date  . ABDOMINAL HYSTERECTOMY    . AV FISTULA PLACEMENT     LEFT ARM  . CATARACT EXTRACTION W/PHACO Right 08/25/2016   Procedure: CATARACT EXTRACTION PHACO AND INTRAOCULAR LENS PLACEMENT (IOC) AND ANTERIOR VITRECTOMY;  Surgeon: Sallee Lange, MD;  Location: [facility] ORS;  Service: Ophthalmology;  Laterality: Right;  FLUID CASSETTE [id number] H, EXP 01/05/18  . CHOLECYSTECTOMY    . CORONARY ARTERY BYPASS GRAFT     05/13/2016  . INSERT / REPLACE / REMOVE PACEMAKER     6/17  . INSERTION OF AHMED VALVE Left 08/04/2017   Procedure: INSERTION OF AHMED VALVE;  Surgeon: Lockie Mola, MD;  Location: [facility] ORS;  Service: Ophthalmology;  Laterality: Left;  . LOWER EXTREMITY ANGIOGRAPHY Left 06/13/2017   Procedure: Lower Extremity Angiography;  Surgeon: Annice Needy, MD;  Location: [facility] INVASIVE CV LAB;  Service: Cardiovascular;  Laterality: Left;  . PACEMAKER IMPLANT    . PERIPHERAL VASCULAR CATHETERIZATION Left 04/08/2016   Procedure: A/V  Shuntogram/Fistulagram;  Surgeon: Annice Needy, MD;  Location: [facility] INVASIVE CV LAB;  Service: Cardiovascular;  Laterality: Left;  . PERIPHERAL VASCULAR CATHETERIZATION N/A 04/08/2016   Procedure: A/V Shunt Intervention;  Surgeon: Annice Needy, MD;  Location: [facility] INVASIVE CV LAB;  Service: Cardiovascular;  Laterality: N/A;  . STONES     KIDNEY  . TONSILLECTOMY    . TUBAL LIGATION       Home Meds: Prior to Admission medications   Medication Sig Start Date End Date Taking? Authorizing Provider  aspirin EC 81 MG tablet Take 81 mg by mouth at bedtime.   Yes [provider]  atorvastatin (LIPITOR) 80 MG tablet Take 80 mg by mouth every evening.   Yes [provider]  bimatoprost (LUMIGAN) 0.03 % ophthalmic solution Place 1 drop into both eyes 2 (two) times daily.   Yes [provider]  Brinzolamide-Brimonidine 1-0.2 % SUSP Apply 1 drop to eye 3 (three) times daily. LEFT EYE   Yes [provider]  calcium acetate (PHOSLO) 667 MG capsule Take 1,334 mg by mouth 3 (  three) times daily with meals.   Yes [provider]  diclofenac sodium (VOLTAREN) 1 % GEL Apply topically 4 (four) times daily.   Yes [provider]  Ferrous Sulfate 140 (45 Fe) MG TBCR Take 1 tablet by mouth daily.   Yes [provider]  Fluticasone Propionate (FLONASE NA) Place 2 puffs into the nose daily.   Yes [provider]  Ipratropium-Albuterol (COMBIVENT) 20-100 MCG/ACT AERS respimat Inhale 2 puffs into the lungs every 6 (six) hours.   Yes [provider]  lisinopril (PRINIVIL,ZESTRIL) 10 MG tablet Take 1 tablet (10 mg total) by mouth daily. 10/30/15 08/08/17 Yes Krebs, Laurel Dimmer, NP  loperamide (IMODIUM) 2 MG capsule Take by mouth as needed for diarrhea or loose stools.   Yes [provider]  Metoprolol Succinate 25 MG CS24 Take 25 mg by mouth daily.   Yes [provider]  nitroGLYCERIN (NITROSTAT) 0.4 MG SL tablet Place 0.4 mg under  the tongue every 5 (five) minutes as needed for chest pain.   Yes [provider]  ondansetron (ZOFRAN) 4 MG tablet Take 4 mg by mouth every 8 (eight) hours as needed for nausea or vomiting.   Yes [provider]  ticagrelor (BRILINTA) 60 MG TABS tablet Take 60 mg by mouth 2 (two) times daily.    Yes [provider]  timolol (BETIMOL) 0.5 % ophthalmic solution Place into both eyes 2 (two) times daily.   Yes [provider]  travoprost, benzalkonium, (TRAVATAN) 0.004 % ophthalmic solution Place 1 drop into both eyes at bedtime.   Yes [provider]  Vitamin D, Ergocalciferol, (DRISDOL) 50000 units CAPS capsule Take 50,000 Units by mouth every 30 (thirty) days.   Yes [provider]  Difluprednate (DUREZOL) 0.05 % EMUL Apply 1 drop to eye. OPERATIVE EYE BID  2 DAYS PRIOR TO SURGERY  ONCE AM SURGERY    [provider]    Inpatient Medications:  . aspirin EC  81 mg Oral QHS  . atorvastatin  80 mg Oral QPM  . calcium acetate  1,334 mg Oral TID WC  . [START ON 08/09/2017] ferrous sulfate  325 mg Oral Q breakfast  . fluticasone  1 spray Each Nare Daily  . heparin  5,000 Units Subcutaneous Q8H  . Ipratropium-Albuterol  2 puff Inhalation Q6H  . irbesartan  150 mg Oral QHS  . latanoprost  1 drop Both Eyes QHS  . lisinopril  10 mg Oral Daily  . metoprolol succinate  25 mg Oral Daily  . ticagrelor  60 mg Oral BID  . [START ON 08/09/2017] Vitamin D (Ergocalciferol)  50,000 Units Oral Q30 days     Allergies:  Allergies  Allergen Reactions  . Colchicine Anaphylaxis  . Clopidogrel     Social History   Social History  . Marital status: Widowed    Spouse name: N/A  . Number of children: N/A  . Years of education: N/A   Occupational History  . Not on file.   Social History Main Topics  . Smoking status: Former Smoker    Quit date: 06/10/1983  . Smokeless tobacco: Former Neurosurgeon  . Alcohol use No  . Drug use: No  . Sexual  activity: Not on file   Other Topics Concern  . Not on file   Social History Narrative  . No narrative on file     Family History  Problem Relation Age of Onset  . Diabetes Mother      Review of  Systems: Review of Systems  Constitutional: Positive for malaise/fatigue. Negative for chills, diaphoresis, fever and weight loss.       Polydipsia   HENT: Negative for congestion.   Eyes: Negative for discharge and redness.  Respiratory: Positive for shortness of breath. Negative for cough, hemoptysis, sputum production and wheezing.   Cardiovascular: Positive for chest pain. Negative for palpitations, orthopnea, claudication, leg swelling and PND.  Gastrointestinal: Positive for nausea. Negative for abdominal pain, blood in stool, heartburn, melena and vomiting.  Genitourinary: Negative for hematuria.  Musculoskeletal: Negative for falls and myalgias.  Skin: Negative for rash.  Neurological: Positive for weakness. Negative for dizziness, tingling, tremors, sensory change, speech change, focal weakness and loss of consciousness.  Endo/Heme/Allergies: Does not bruise/bleed easily.  Psychiatric/Behavioral: Negative for substance abuse. The patient is not nervous/anxious.   All other systems reviewed and are negative.   Labs:  Recent Labs  08/08/17 0605  TROPONINI 1.02*   Lab Results  Component Value Date   WBC 10.1 08/08/2017   HGB 10.1 (L) 08/08/2017   HCT 30.3 (L) 08/08/2017   MCV 92.0 08/08/2017   PLT 207 08/08/2017     Recent Labs Lab 08/08/17 0605  NA 137  K 4.4  CL 95*  CO2 27  BUN 53*  CREATININE 7.57*  CALCIUM 9.5  PROT 7.9  BILITOT 0.7  ALKPHOS 73  ALT 8*  AST 53*  GLUCOSE 292*   Lab Results  Component Value Date   CHOL 188 08/15/2015   HDL 36 08/15/2015   LDLCALC 84 08/15/2015   TRIG 340 (A) 08/15/2015   No results found for: DDIMER  Radiology/Studies:  Dg Chest 2 View  Result Date: 08/08/2017 CLINICAL DATA:  Shortness of breath EXAM: CHEST   2 VIEW COMPARISON:  None. FINDINGS: There is diffuse interstitial opacity with Kerley lines. Trace pleural fluid. Borderline heart size. The patient is status post CABG and dual-chamber pacer implant. Negative aortic and hilar contours. Large lung volumes with diaphragm flattening. IMPRESSION: CHF. Electronically Signed   By: Marnee Spring M.D.   On: 08/08/2017 07:08    EKG: Interpreted by me showed: NSR, 89 bpm, LVH, lateral TWI (unclear how/if this is different from EKG done at [facility] in 04/2017) Telemetry: Interpreted by me showed: currently NSR 80s bpm, episode of narrow complex tachycardia in the 120s to 130s bpm starting at 10:06, though she converted back to sinus rhythm while off telemetry, being back in sinus rhythm when telemetry was reconnected at 10:41 AM  Weights: Filed Weights   08/08/17 0559  Weight: 141 lb 1.5 oz (64 kg)     Physical Exam: Blood pressure (!) 194/80, pulse 89, temperature 97.8 F (36.6 C), resp. rate 18, height  (1.549 m), weight 141 lb 1.5 oz (64 kg), SpO2 100 %. Body mass index is 26.66 kg/m. General: Well developed, well nourished, in no acute distress. Head: Normocephalic, atraumatic, sclera non-icteric, no xanthomas, nares are without discharge.  Neck: Negative for carotid bruits. JVD not elevated. Lungs: Diminished breath sounds bilaterally. Breathing is unlabored. Heart: RRR with S1 S2. I/VI systolic murmur along the apex, no rubs, or gallops appreciated. Palpation of the left anterior chest wall fully reproduces her chest pain.  Abdomen: Soft, non-tender, non-distended with normoactive bowel sounds. No hepatomegaly. No rebound/guarding. No obvious abdominal masses. Msk:  Strength and tone appear normal for age. Extremities: No clubbing or cyanosis. No edema. Distal pedal pulses are 2+ and equal bilaterally. Neuro: Alert and oriented X 3. No facial asymmetry.  No focal deficit. Moves all extremities spontaneously. Psych:  Responds to questions  appropriately with a normal affect.    Assessment and Plan:  Active Problems:   Pulmonary edema    1. Elevated troponin/abnormal EKG: -Initial troponin 1.02, chest pain is fully reproducible to palpation on exam -Continue to cycle troponin levels until peaks and down trending -Will hold off on starting heparin gtt at this time until we see what her 2nd value is. If this is dynamically elevated, would recommended heparin gtt -Check echo -Attempt to get a copy of EKG done at [facility] in 04/2017 for comparison   2. CAD as above: -As above -Continue ASA 81 mg daily -Her [facility] home medication list consists of Plavix 75 mg daily with ASA 81 mg daily. Upon admission to [facility], it was noted she was taking Brilinta 60 mg bid with ASA 81 mg daily -The patient is not certain what medications she is taking at home -Continue PTA medications  3. Acute diastolic CHF: -She appears mildly volume up -Check echo as above -Volume managed by HD  4. ESRD on HD: -Per Renal  5. Malignant hypertension: -Improving -Recommend adding hydralazine, pending BP following HD today -Consider stopping either ARB or ACEi, will defer to IM -Recommend adding Imdur and titrating, await BP following HD -Continue Toprol XL  6. HLD: -Lipitor  7. Anemia of chronic disease: -Stable  8. PAD: -Followed by vascular    Signed, Eula Listen, PA-C [facility] Pager: [phone] 08/08/2017, 10:39 AM

## 2017-08-08 NOTE — Progress Notes (Signed)
Per Dr. Allena Katz no need to test for the flu as lab had asked about this. They had a sample sent down from the ED. Pt has no symptoms of the flu.

## 2017-08-08 NOTE — ED Notes (Signed)
Attempt 2nd blood culture and IV start x 2. Both attempts vein cannulized and patient pulls away and catheter dislodged. Patient refuses further sticks. Md aware and second cultures dc'd.

## 2017-08-08 NOTE — ED Notes (Signed)
Labetalol given prior to transport due to elevated blood pressure. Plan for dialysis.

## 2017-08-08 NOTE — ED Triage Notes (Signed)
Pt to rm 18 from dialysis via EMS. Pt reports she went to dialysis this morning already feeling bad and having shortness of breath since around midnight. Pt states she thinks she is getting the flu. Pt reports she has had a dry cough for 2 to 3 days. Pt is a PACE participant. Pt given two duonebs by EMS who report diminished lung sounds on their arrival.

## 2017-08-08 NOTE — ED Notes (Signed)
Carrie contacted at [facility] dialysis re patient needs dialysis. Call back 10 min and will try to arrange time.  [phone]

## 2017-08-08 NOTE — Discharge Instructions (Signed)
We believe your shortness of breath is caused by CHF and extra fluid on your lungs.  The treatment for this is dialysis, and your center said they are able to accommodate you today.  Please go to the Center as we discussed for your dialysis center.

## 2017-08-08 NOTE — Progress Notes (Signed)
Pharmacy consulted to allow patient to use her own eye drops. POM eye drops allowed.

## 2017-08-08 NOTE — Progress Notes (Signed)
  HEMODIALYSIS FLOWSHEET:  Blood Flow Rate (mL/min): 250 mL/min Arterial Pressure (mmHg): -180 mmHg Venous Pressure (mmHg): 170 mmHg Transmembrane Pressure (mmHg): 70 mmHg Ultrafiltration Rate (mL/min): 300 mL/min Dialysate Flow Rate (mL/min): 800 ml/min Conductivity: Machine : 13.8 Conductivity: Machine : 13.8 Dialysis Fluid Bolus: Normal Saline Bolus Amount (mL): 200 mL Dialysate Change:  (bicarb jug change)   Vitals:   08/08/17 1430 08/08/17 1434  BP: (!) 156/69 (!) 155/69  Pulse: 85 85  Resp: 16 18  Temp:  98.2 F (36.8 C)  SpO2: 100% 97%   Patient seen during dialysis Blood pressure control is better Shortness of breath has decreased some Patient feels anxious and wants her timed reduced. Reduced time to 3 hours 15 min. Continue to monitor

## 2017-08-08 NOTE — ED Notes (Signed)
Dr. Ace Gins answers phone, transferred to Dr. York Cerise.

## 2017-08-08 NOTE — Consult Note (Signed)
Date: 08/08/2017                  Patient Name:  Nichole Hurst  MRN: [id number]  DOB: 1951-06-24  Age / Sex: 66 y.o., female         PCP: Bobbye Morton, MD                 Service Requesting Consult: IM/ Enedina Finner, MD                 Reason for Consult: ESRD, Urgent HD            History of Present Illness: Patient is a 66 y.o. female with medical problems of end-stage renal disease, hypertension, COPD, who was admitted to [facility] on 08/08/2017 for evaluation of shortness of breath.  Patient presents with increasing dyspnea and shortness of breath at home. She denies missing any medications. She normally dialyzes at [facility] [address].  She has been on dialysis for 2-3 years Upon presentation, patient's blood pressure was noted to be severely elevated at 214/94 Patient reports significant shortness of breath Urgent dialysis treatment is requested For volume removal    Medications: Outpatient medications:  (Not in a hospital admission)  Current medications: Current Facility-Administered Medications  Medication Dose Route Frequency Provider Last Rate Last Dose  . labetalol (NORMODYNE,TRANDATE) injection 10 mg  10 mg Intravenous Once Loleta Rose, MD       Current Outpatient Prescriptions  Medication Sig Dispense Refill  . aspirin EC 81 MG tablet Take 81 mg by mouth at bedtime.    Marland Kitchen atorvastatin (LIPITOR) 80 MG tablet Take 80 mg by mouth every evening.    . bimatoprost (LUMIGAN) 0.03 % ophthalmic solution Place 1 drop into both eyes 2 (two) times daily.    . Brinzolamide-Brimonidine 1-0.2 % SUSP Apply 1 drop to eye 3 (three) times daily. LEFT EYE    . calcium acetate (PHOSLO) 667 MG capsule Take 1,334 mg by mouth 3 (three) times daily with meals.    . diclofenac sodium (VOLTAREN) 1 % GEL Apply topically 4 (four) times daily.    . Ferrous Sulfate 140 (45 Fe) MG TBCR Take 1 tablet by mouth daily.    . Fluticasone Propionate (FLONASE NA) Place 2 puffs into the nose daily.    .  Ipratropium-Albuterol (COMBIVENT) 20-100 MCG/ACT AERS respimat Inhale 2 puffs into the lungs every 6 (six) hours.    Marland Kitchen lisinopril (PRINIVIL,ZESTRIL) 10 MG tablet Take 1 tablet (10 mg total) by mouth daily. 90 tablet 3  . loperamide (IMODIUM) 2 MG capsule Take by mouth as needed for diarrhea or loose stools.    . Metoprolol Succinate 25 MG CS24 Take 25 mg by mouth daily.    . nitroGLYCERIN (NITROSTAT) 0.4 MG SL tablet Place 0.4 mg under the tongue every 5 (five) minutes as needed for chest pain.    Marland Kitchen ondansetron (ZOFRAN) 4 MG tablet Take 4 mg by mouth every 8 (eight) hours as needed for nausea or vomiting.    . ticagrelor (BRILINTA) 60 MG TABS tablet Take 60 mg by mouth 2 (two) times daily.     . timolol (BETIMOL) 0.5 % ophthalmic solution Place into both eyes 2 (two) times daily.    . travoprost, benzalkonium, (TRAVATAN) 0.004 % ophthalmic solution Place 1 drop into both eyes at bedtime.    . Vitamin D, Ergocalciferol, (DRISDOL) 50000 units CAPS capsule Take 50,000 Units by mouth every 30 (thirty) days.    . clopidogrel (  PLAVIX) 75 MG tablet Take 1 tablet (75 mg total) by mouth daily. (Patient not taking: Reported on 08/02/2017) 30 tablet 11  . Difluprednate (DUREZOL) 0.05 % EMUL Apply 1 drop to eye. OPERATIVE EYE BID  2 DAYS PRIOR TO SURGERY  ONCE AM SURGERY        Allergies: Allergies  Allergen Reactions  . Colchicine Anaphylaxis  . Clopidogrel       Past Medical History: Past Medical History:  Diagnosis Date  . Anemia   . Carotid stenosis   . Charcot's gait    FOOT  . CHF (congestive heart failure) (HCC)   . Claustrophobia   . Colon polyp   . Coronary artery disease   . Dementia   . Diabetes mellitus without complication (HCC)   . Dyspnea    DOE WALKING FAST  . Dysrhythmia   . Emphysema/COPD (HCC)   . Fractures    Lt foot  . GERD (gastroesophageal reflux disease)   . Glaucoma   . History of kidney stones   . Hyperlipidemia   . Hypertension   . Migraine   . Orthopnea    . Oxygen deficiency    AS NEEDED  . Oxygen deficit    USE PRN  . PAD (peripheral artery disease) (HCC)   . Pain    JOINTS  . Pneumonia    IN PAST  . Presence of permanent cardiac pacemaker    St. Jude   . Renal failure    DIAYLISIS M/W/F  . Retinopathy    DIABETIC  . Stroke [facility]) 09/2015   Ischemic stroke with R sided hemiparesis     Past Surgical History: Past Surgical History:  Procedure Laterality Date  . ABDOMINAL HYSTERECTOMY    . AV FISTULA PLACEMENT     LEFT ARM  . CATARACT EXTRACTION W/PHACO Right 08/25/2016   Procedure: CATARACT EXTRACTION PHACO AND INTRAOCULAR LENS PLACEMENT (IOC) AND ANTERIOR VITRECTOMY;  Surgeon: Sallee Lange, MD;  Location: [facility] ORS;  Service: Ophthalmology;  Laterality: Right;  FLUID CASSETTE [id number] H, EXP 01/05/18  . CHOLECYSTECTOMY    . CORONARY ARTERY BYPASS GRAFT     05/13/2016  . INSERT / REPLACE / REMOVE PACEMAKER     6/17  . INSERTION OF AHMED VALVE Left 08/04/2017   Procedure: INSERTION OF AHMED VALVE;  Surgeon: Lockie Mola, MD;  Location: [facility] ORS;  Service: Ophthalmology;  Laterality: Left;  . LOWER EXTREMITY ANGIOGRAPHY Left 06/13/2017   Procedure: Lower Extremity Angiography;  Surgeon: Annice Needy, MD;  Location: [facility] INVASIVE CV LAB;  Service: Cardiovascular;  Laterality: Left;  . PACEMAKER IMPLANT    . PERIPHERAL VASCULAR CATHETERIZATION Left 04/08/2016   Procedure: A/V Shuntogram/Fistulagram;  Surgeon: Annice Needy, MD;  Location: [facility] INVASIVE CV LAB;  Service: Cardiovascular;  Laterality: Left;  . PERIPHERAL VASCULAR CATHETERIZATION N/A 04/08/2016   Procedure: A/V Shunt Intervention;  Surgeon: Annice Needy, MD;  Location: [facility] INVASIVE CV LAB;  Service: Cardiovascular;  Laterality: N/A;  . STONES     KIDNEY  . TONSILLECTOMY    . TUBAL LIGATION       Family History: Family History  Problem Relation Age of Onset  . Diabetes Mother      Social History: Social History   Social History  . Marital status:  Widowed    Spouse name: N/A  . Number of children: N/A  . Years of education: N/A   Occupational History  . Not on file.   Social History Main Topics  . Smoking status:  Former Smoker    Quit date: 06/10/1983  . Smokeless tobacco: Former Neurosurgeon  . Alcohol use No  . Drug use: No  . Sexual activity: Not on file   Other Topics Concern  . Not on file   Social History Narrative  . No narrative on file     Review of Systems: Gen: denies fevers or chills HEENT: had left eye surgery last week, reports pain CV: some chest discomfort, hypertensive Resp: dyspneic, cough, no sputum GI: able to eat at home.  No nausea, vomiting, diarrhea reported GU : stay she still makes urine MS: no complaints at present Derm:  no complaints Psych:no complaints Heme: no complaints Neuro: no complaints Endocrine. no complaints  Vital Signs: Blood pressure (!) 185/107, pulse 94, temperature 98 F (36.7 C), temperature source Oral, resp. rate 20, height  (1.549 m), weight 64 kg (141 lb 1.5 oz), SpO2 97 %.  No intake or output data in the 24 hours ending 08/08/17 0918  Weight trends: Filed Weights   08/08/17 0559  Weight: 64 kg (141 lb 1.5 oz)    Physical Exam: General:  ill-appearing, sitting up in the ER bed  HEENT Left periorbital ecchymosis, right anicteric, moist oral mucous membranes  Neck:  distended neck veins  Lungs: Mild Diffuse bilateral crackles, decreased breath sounds at bases  Heart:: irregular, soft systolic murmur present  Abdomen: Soft. Nontender, nondistended  Extremities:  no peripheral edema  Neurologic: Alert, oriented  Skin: No acute rashes  Access: Left upper arm AV fistula          Lab results: Basic Metabolic Panel:  Recent Labs Lab 08/04/17 0925 08/08/17 0605  NA 140 137  K 4.0 4.4  CL  --  95*  CO2  --  27  GLUCOSE 206* 292*  BUN  --  53*  CREATININE  --  7.57*  CALCIUM  --  9.5    Liver Function Tests:  Recent Labs Lab  08/08/17 0605  AST 53*  ALT 8*  ALKPHOS 73  BILITOT 0.7  PROT 7.9  ALBUMIN 4.2   No results for input(s): LIPASE, AMYLASE in the last 168 hours. No results for input(s): AMMONIA in the last 168 hours.  CBC:  Recent Labs Lab 08/04/17 0925 08/08/17 0605  WBC  --  10.1  NEUTROABS  --  8.0*  HGB 11.2* 10.1*  HCT 33.0* 30.3*  MCV  --  92.0  PLT  --  207    Cardiac Enzymes: No results for input(s): CKTOTAL, TROPONINI in the last 168 hours.  BNP: Invalid input(s): POCBNP  CBG:  Recent Labs Lab 08/04/17 0904  GLUCAP 197*    Microbiology: No results found for this or any previous visit (from the past 720 hour(s)).   Coagulation Studies:  Recent Labs  08/08/17 0605  LABPROT 15.0  INR 1.19    Urinalysis: No results for input(s): COLORURINE, LABSPEC, PHURINE, GLUCOSEU, HGBUR, BILIRUBINUR, KETONESUR, PROTEINUR, UROBILINOGEN, NITRITE, LEUKOCYTESUR in the last 72 hours.  Invalid input(s): APPERANCEUR      Imaging: Dg Chest 2 View  Result Date: 08/08/2017 CLINICAL DATA:  Shortness of breath EXAM: CHEST  2 VIEW COMPARISON:  None. FINDINGS: There is diffuse interstitial opacity with Kerley lines. Trace pleural fluid. Borderline heart size. The patient is status post CABG and dual-chamber pacer implant. Negative aortic and hilar contours. Large lung volumes with diaphragm flattening. IMPRESSION: CHF. Electronically Signed   By: Marnee Spring M.D.   On: 08/08/2017 07:08  Assessment & Plan: Pt is a 66 y.o.   female with ESRD on HD for 2-3 years, [facility] [address], MWF,Stroke 2016, residual rt sided weakness improved now, CABG- [facility], 2017 , Pacemaker , COPD, Left foot fracture, Left eye surgery Friday, was admitted on 08/08/2017 with SOB.   [facility] [address]/ [facility]/ MWF  1. ESRD 2. SOB - acute pulm Edema 3. Malignant HTN 4.  Anemia of chronic kidney disease 5.  Secondary hyperparathyroidism  Plan: Emergent hemodialysis treatment for volume removal to address  shortness of breath and pulmonary edema along with hypertensive emergency Volume removal goal of 2 L as tolerated Hold Epogen due to severe hypertension but may be able to restart later We will monitor phosphorus during this hospital admission  Further plan as hospital course progresses

## 2017-08-08 NOTE — ED Notes (Signed)
Report to abby, prepared for transport.

## 2017-08-08 NOTE — Progress Notes (Signed)
Per Dr. Jearl Klinefelter okay to place order for tramadol  3 times daily PRN for pain in the left eye.

## 2017-08-08 NOTE — Consult Note (Signed)
Subjective: Had tube shunt surgery with DR Nichole Hurst last THursday. Some sharp pain intermittently and dull ache constantly since. Va very blurry  Objective: Vital signs in last 24 hours: Temp:  [97.8 F (36.6 C)-98.7 F (37.1 C)] 98.7 F (37.1 C) (10/01 1506) Pulse Rate:  [83-94] 86 (10/01 1506) Resp:  [16-22] 18 (10/01 1506) BP: (140-214)/(61-107) 140/61 (10/01 1506) SpO2:  [95 %-100 %] 96 % (10/01 1506) Weight:  [64 kg (141 lb 1.5 oz)] 64 kg (141 lb 1.5 oz) (10/01 0559)    Exam:  VA  OD   Count fingers 4 feet             NO APD            IOP 5 mmHg  Mild bruising of lids. Mild subconj Hem. AC: Formed but shallow AC        No hyphema nor hypopyon       Relatively quiet AC        Tube shunt in place        IOL undilated fundus exam clear view to macula. No choroidals     Recent Labs  08/08/17 0605  WBC 10.1  HGB 10.1*  HCT 30.3*  NA 137  K 4.4  CL 95*  CO2 27  BUN 53*  CREATININE 7.57*    Studies/Results: Dg Chest 2 View  Result Date: 08/08/2017 CLINICAL DATA:  Shortness of breath EXAM: CHEST  2 VIEW COMPARISON:  None. FINDINGS: There is diffuse interstitial opacity with Kerley lines. Trace pleural fluid. Borderline heart size. The patient is status post CABG and dual-chamber pacer implant. Negative aortic and hilar contours. Large lung volumes with diaphragm flattening. IMPRESSION: CHF. Electronically Signed   By: Marnee Spring M.D.   On: 08/08/2017 07:08      Assessment/Plan:   POD 4 Tube shunt.OD               Normal post-op  discomfort. No evidence of complication or infection.  1) Continue Vigamox and Durezol 4 times daily OD. Follow up with Dr. Inez Hurst as scheduled this week..   LOS: 0 days   Nichole Hurst 10/1/20187:26 PM

## 2017-08-08 NOTE — H&P (Signed)
[facility] - [place] at [facility]   PATIENT NAME: Nichole Hurst    MR#:  [id number]  DATE OF BIRTH:  [birth year]  DATE OF ADMISSION:  08/08/2017  PRIMARY CARE PHYSICIAN: Bobbye Morton, MD   REQUESTING/REFERRING PHYSICIAN: Dr York Cerise  CHIEF COMPLAINT:   increasing shortness of breath since yesterday. HISTORY OF PRESENT ILLNESS:  Nichole Hurst  is a 66 y.o. female with a known history of end-stage renal disease on hemodialysis,coronary artery disease status post CA!&< TACHYBRADYCARDIA SYNDROME STS POST DUAL PACEMAKER N7 2017, peripheral artery disease status post angioplasty of the left PTA and ATA comes to the emergency room with increasing shortness of breath. Patient went to dialysis was found to be short of breath with elevated blood pressure sent to the emergency room was found to be pulmonary edema/volume overload/acute on chronic diastolic congestive heart failure.  PAST MEDICAL HISTORY:   Past Medical History:  Diagnosis Date  . Anemia of chronic disease   . CAD (coronary artery disease)    a. s/p 2-V CABG 05/2016 (LIMA-LAD, VG-OM1)  . Carotid stenosis   . Charcot's gait    FOOT  . Claustrophobia   . Colon polyp   . Dementia   . Diabetes mellitus with complication (HCC)   . Emphysema/COPD (HCC)   . ESRD (end stage renal disease) on dialysis (HCC)    DIAYLISIS M/W/F  . Fractures    Lt foot  . GERD (gastroesophageal reflux disease)   . Glaucoma   . History of kidney stones   . Hyperlipidemia   . Hypertension   . Migraine   . Orthopnea   . Oxygen deficiency    AS NEEDED  . PAD (peripheral artery disease) (HCC)   . Pain    JOINTS  . Presence of permanent cardiac pacemaker    St. Jude   . Retinopathy    DIABETIC  . Stroke [facility]) 09/2015   Ischemic stroke with R sided hemiparesis    PAST SURGICAL HISTOIRY:   Past Surgical History:  Procedure Laterality Date  . ABDOMINAL HYSTERECTOMY    . AV FISTULA PLACEMENT     LEFT ARM  .  CATARACT EXTRACTION W/PHACO Right 08/25/2016   Procedure: CATARACT EXTRACTION PHACO AND INTRAOCULAR LENS PLACEMENT (IOC) AND ANTERIOR VITRECTOMY;  Surgeon: Sallee Lange, MD;  Location: [facility] ORS;  Service: Ophthalmology;  Laterality: Right;  FLUID CASSETTE [id number] H, EXP 01/05/18  . CHOLECYSTECTOMY    . CORONARY ARTERY BYPASS GRAFT     05/13/2016  . INSERT / REPLACE / REMOVE PACEMAKER     6/17  . INSERTION OF AHMED VALVE Left 08/04/2017   Procedure: INSERTION OF AHMED VALVE;  Surgeon: Lockie Mola, MD;  Location: [facility] ORS;  Service: Ophthalmology;  Laterality: Left;  . LOWER EXTREMITY ANGIOGRAPHY Left 06/13/2017   Procedure: Lower Extremity Angiography;  Surgeon: Annice Needy, MD;  Location: [facility] INVASIVE CV LAB;  Service: Cardiovascular;  Laterality: Left;  . PACEMAKER IMPLANT    . PERIPHERAL VASCULAR CATHETERIZATION Left 04/08/2016   Procedure: A/V Shuntogram/Fistulagram;  Surgeon: Annice Needy, MD;  Location: [facility] INVASIVE CV LAB;  Service: Cardiovascular;  Laterality: Left;  . PERIPHERAL VASCULAR CATHETERIZATION N/A 04/08/2016   Procedure: A/V Shunt Intervention;  Surgeon: Annice Needy, MD;  Location: [facility] INVASIVE CV LAB;  Service: Cardiovascular;  Laterality: N/A;  . STONES     KIDNEY  . TONSILLECTOMY    . TUBAL LIGATION      SOCIAL HISTORY:   Social History  Substance Use  Topics  . Smoking status: Former Smoker    Quit date: 06/10/1983  . Smokeless tobacco: Former Neurosurgeon  . Alcohol use No    FAMILY HISTORY:   Family History  Problem Relation Age of Onset  . Diabetes Mother     DRUG ALLERGIES:   Allergies  Allergen Reactions  . Colchicine Anaphylaxis  . Clopidogrel     REVIEW OF SYSTEMS:  Review of Systems  Constitutional: Negative for chills, fever and weight loss.  HENT: Negative for ear discharge, ear pain and nosebleeds.   Eyes: Negative for blurred vision, pain and discharge.  Respiratory: Positive for shortness of breath. Negative for sputum production,  wheezing and stridor.   Cardiovascular: Positive for PND. Negative for chest pain, palpitations and orthopnea.  Gastrointestinal: Negative for abdominal pain, diarrhea, nausea and vomiting.  Genitourinary: Negative for frequency and urgency.  Musculoskeletal: Negative for back pain and joint pain.  Neurological: Positive for weakness. Negative for sensory change, speech change and focal weakness.  Psychiatric/Behavioral: Negative for depression and hallucinations. The patient is not nervous/anxious.      MEDICATIONS AT HOME:   Prior to Admission medications   Medication Sig Start Date End Date Taking? Authorizing Provider  aspirin EC 81 MG tablet Take 81 mg by mouth at bedtime.   Yes [provider]  atorvastatin (LIPITOR) 80 MG tablet Take 80 mg by mouth every evening.   Yes [provider]  bimatoprost (LUMIGAN) 0.03 % ophthalmic solution Place 1 drop into both eyes 2 (two) times daily.   Yes [provider]  Brinzolamide-Brimonidine 1-0.2 % SUSP Apply 1 drop to eye 3 (three) times daily. LEFT EYE   Yes [provider]  calcium acetate (PHOSLO) 667 MG capsule Take 1,334 mg by mouth 3 (three) times daily with meals.   Yes [provider]  diclofenac sodium (VOLTAREN) 1 % GEL Apply topically 4 (four) times daily.   Yes [provider]  Ferrous Sulfate 140 (45 Fe) MG TBCR Take 1 tablet by mouth daily.   Yes [provider]  Fluticasone Propionate (FLONASE NA) Place 2 puffs into the nose daily.   Yes [provider]  Ipratropium-Albuterol (COMBIVENT) 20-100 MCG/ACT AERS respimat Inhale 2 puffs into the lungs every 6 (six) hours.   Yes [provider]  lisinopril (PRINIVIL,ZESTRIL) 10 MG tablet Take 1 tablet (10 mg total) by mouth daily. 10/30/15 08/08/17 Yes Krebs, Laurel Dimmer, NP  loperamide (IMODIUM) 2 MG capsule Take by mouth as needed for diarrhea or loose stools.   Yes [provider]  Metoprolol  Succinate 25 MG CS24 Take 25 mg by mouth daily.   Yes [provider]  nitroGLYCERIN (NITROSTAT) 0.4 MG SL tablet Place 0.4 mg under the tongue every 5 (five) minutes as needed for chest pain.   Yes [provider]  ondansetron (ZOFRAN) 4 MG tablet Take 4 mg by mouth every 8 (eight) hours as needed for nausea or vomiting.   Yes [provider]  ticagrelor (BRILINTA) 60 MG TABS tablet Take 60 mg by mouth 2 (two) times daily.    Yes [provider]  timolol (BETIMOL) 0.5 % ophthalmic solution Place into both eyes 2 (two) times daily.   Yes [provider]  travoprost, benzalkonium, (TRAVATAN) 0.004 % ophthalmic solution Place 1 drop into both eyes at bedtime.   Yes [provider]  Vitamin D, Ergocalciferol, (DRISDOL) 50000 units CAPS capsule Take 50,000 Units by mouth every 30 (thirty) days.   Yes [provider]  Difluprednate (DUREZOL) 0.05 % EMUL Apply 1 drop to eye. OPERATIVE EYE BID  2 DAYS PRIOR TO SURGERY  ONCE AM SURGERY    [provider]      VITAL SIGNS:  Blood pressure (!) 180/72, pulse 89, temperature 97.8 F (36.6 C), resp. rate (!) 22, height  (1.549 m), weight 64 kg (141 lb 1.5 oz), SpO2 100 %.  PHYSICAL EXAMINATION:  GENERAL:  66 y.o.-year-old patient lying in the bed with no acute distress.  EYES: Pupils equal, round, reactive to light and accommodation. No scleral icterus. Extraocular muscles intact.  HEENT: Head atraumatic, normocephalic. Oropharynx and nasopharynx clear.  NECK:  Supple, no jugular venous distention. No thyroid enlargement, no tenderness.  LUNGS: decreased breath sounds bilaterally, no wheezing, few bibasilar rales,no rhonchi or crepitation. No use of accessory muscles of respiration.  CARDIOVASCULAR: S1, S2 normal. No murmurs, rubs, or gallops.  ABDOMEN: Soft, nontender, nondistended. Bowel sounds present. No organomegaly or mass.  EXTREMITIES: No pedal edema, cyanosis, or  clubbing.  NEUROLOGIC: Cranial nerves II through XII are intact. Muscle strength 5/5 in all extremities. Sensation intact. Gait not checked.  PSYCHIATRIC:  patient is alert and oriented x 3.  SKIN: No obvious rash, lesion, or ulcer.   LABORATORY PANEL:   CBC  Recent Labs Lab 08/08/17 0605  WBC 10.1  HGB 10.1*  HCT 30.3*  PLT 207   ------------------------------------------------------------------------------------------------------------------  Chemistries   Recent Labs Lab 08/08/17 0605  NA 137  K 4.4  CL 95*  CO2 27  GLUCOSE 292*  BUN 53*  CREATININE 7.57*  CALCIUM 9.5  AST 53*  ALT 8*  ALKPHOS 73  BILITOT 0.7   ------------------------------------------------------------------------------------------------------------------  Cardiac Enzymes  Recent Labs Lab 08/08/17 0605  TROPONINI 1.02*   ------------------------------------------------------------------------------------------------------------------  RADIOLOGY:  Dg Chest 2 View  Result Date: 08/08/2017 CLINICAL DATA:  Shortness of breath EXAM: CHEST  2 VIEW COMPARISON:  None. FINDINGS: There is diffuse interstitial opacity with Kerley lines. Trace pleural fluid. Borderline heart size. The patient is status post CABG and dual-chamber pacer implant. Negative aortic and hilar contours. Large lung volumes with diaphragm flattening. IMPRESSION: CHF. Electronically Signed   By: Marnee Spring M.D.   On: 08/08/2017 07:08    EKG:    IMPRESSION AND PLAN:   Nichole Hurst  is a 66 y.o. female with a known history of end-stage renal disease on hemodialysis,coronary artery disease status post CABG, h/oTACHYBRADYCARDIA SYNDROME S/P POST DUAL PACEMAKER July 2017, peripheral artery disease status post angioplasty of the left PTA and ATA comes to the emergency room with increasing shortness of breath. Patient went to dialysis was found to be short of breath with elevated blood pressure sent to the emergency room was  found to be pulmonary edema/volume overload/acute on chronic diastolic congestive heart failure  1. Acute hypoxic respiratory failure secondary to acute on chronic diastolic congestive heart failure/pulmonary edema/volume overload -Admit to telemetry -Nephrology consultation for urgent hemodialysis and ultrafiltration -Monitor I's and O's -Continue cardiac meds  2. Elevated troponin without chest pain---appears demand ischemia in the setting of #1 -EKG shows ST depression lateral leads appears likely LVH in the setting of malignant hypertension -Troponin  3 -Continue aspirin -cardiac consultation  3. Malignant hypertension -Continue Toprol-XL -when necessary hydralazine  4.End-stage renal disease on hemodialysis -Nephrology  Consulted -discussed with Dr Thedore Mins  5. Coronary artery disease status post CABG _Continue aspirin -Continue brilinta 60 mg bid  7. Hyperlipidemia -Continue Lipitor  8. DVT prophylaxis subcutaneous heparin  All the records are reviewed and case discussed with ED provider. Management plans discussed with the patient, family and they are in agreement.  CODE STATUS: full  TOTAL TIME TAKING CARE OF THIS PATIENT: 50 minutes.    Lawton Dollinger M.D on 08/08/2017 at 11:32 AM  Between 7am to 6pm - Pager - [phone]  After 6pm go to [email or web address] - password [email or web address]  [facility]  Office  [phone]  CC: Primary care physician; Bobbye Morton, MD

## 2017-08-08 NOTE — ED Provider Notes (Signed)
[facility] Emergency Department Provider Note  ____________________________________________   First MD Initiated Contact with Patient 08/08/17 [phone]     (approximate)  I have reviewed the triage vital signs and the nursing notes.   HISTORY  Chief Complaint Shortness of Breath    HPI Nichole Hurst is a 66 y.o. female with a history of end-stage renal disease on hemodialysis Mondays, Wednesdays, and Fridays who presents by EMS from the dialysis center with a complaint of shortness of breath.  She reports that it started relatively acutely at about midnight.  It is moderate and made worse with exertion and lying flat.  Nothing in particular makes it better except for sitting up.  She went to dialysis this morning and given her complaint of shortness of breath the dialysis center called 911 and had her transported to the emergency department.  She denies chest pain, fever/chills, N/V, abdominal pain, dysuria.  Describes SOB as mild at rest, moderate with exertion.  She feels generally weak all over .  She got a flu vaccination 2 weeks ago but has had no symptoms such as fever/chills, chest pain, nausea or vomiting, nor abdominal pain.  She has not had any dysuria and does still produce urine daily in spite of her end-stage renal disease.  She has had a mild cough over the last 2-3 days.  She received 2 DuoNeb's in route to the emergency department by EMS but they did not change her respiratory status.  She has not used tobacco for 25+ years and has no history of COPD.  She was not hypoxemic but was started on 2 L of oxygen for comfort.  Of note, she had a shunt placed in her left eye several days ago by Dr. Inez Pilgrim.  She had been relatively pain-free, but she developed acute pain in her eye overnight.  She describes it as sharp and stabbing pain, nothing makes better nor worse.  No visual changes that she can appreciate.  It is causing her to have a left-sided headache.   Her eye has been red and irritated since the surgery.  Past Medical History:  Diagnosis Date  . Anemia   . Carotid stenosis   . Charcot's gait    FOOT  . CHF (congestive heart failure) (HCC)   . Claustrophobia   . Colon polyp   . Coronary artery disease   . Dementia   . Diabetes mellitus without complication (HCC)   . Dyspnea    DOE WALKING FAST  . Dysrhythmia   . Emphysema/COPD (HCC)   . Fractures    Lt foot  . GERD (gastroesophageal reflux disease)   . Glaucoma   . History of kidney stones   . Hyperlipidemia   . Hypertension   . Migraine   . Orthopnea   . Oxygen deficiency    AS NEEDED  . Oxygen deficit    USE PRN  . PAD (peripheral artery disease) (HCC)   . Pain    JOINTS  . Pneumonia    IN PAST  . Presence of permanent cardiac pacemaker    [employer]   . Renal failure    DIAYLISIS M/W/F  . Retinopathy    DIABETIC  . Stroke [facility]) 09/2015   Ischemic stroke with R sided hemiparesis    Patient Active Problem List   Diagnosis Date Noted  . Pulmonary edema 08/08/2017  . Atherosclerosis of native arteries of the extremities with gangrene (HCC) 06/08/2017  . CHF (congestive heart failure) (HCC)  10/30/2015  . History of stroke with residual deficit 10/30/2015  . Hyperlipidemia 10/30/2015  . Cerebrovascular accident (CVA) (HCC) 09/09/2015  . CAD in native artery 08/29/2015  . Carotid artery narrowing 11/26/2014  . Anemia of chronic disease 09/03/2014  . End stage renal failure on dialysis (HCC) 09/03/2014  . Chest pain 07/12/2014  . Breath shortness 07/12/2014  . Type 2 diabetes mellitus with end-stage renal disease (HCC) 03/17/2014  . BP (high blood pressure) 03/17/2014    Past Surgical History:  Procedure Laterality Date  . ABDOMINAL HYSTERECTOMY    . AV FISTULA PLACEMENT     LEFT ARM  . CATARACT EXTRACTION W/PHACO Right 08/25/2016   Procedure: CATARACT EXTRACTION PHACO AND INTRAOCULAR LENS PLACEMENT (IOC) AND ANTERIOR VITRECTOMY;  Surgeon: Sallee Lange, MD;  Location: [facility] ORS;  Service: Ophthalmology;  Laterality: Right;  FLUID CASSETTE [id number] H, EXP 01/05/18  . CHOLECYSTECTOMY    . CORONARY ARTERY BYPASS GRAFT     05/13/2016  . INSERT / REPLACE / REMOVE PACEMAKER     6/17  . INSERTION OF AHMED VALVE Left 08/04/2017   Procedure: INSERTION OF AHMED VALVE;  Surgeon: Lockie Mola, MD;  Location: [facility] ORS;  Service: Ophthalmology;  Laterality: Left;  . LOWER EXTREMITY ANGIOGRAPHY Left 06/13/2017   Procedure: Lower Extremity Angiography;  Surgeon: Annice Needy, MD;  Location: [facility] INVASIVE CV LAB;  Service: Cardiovascular;  Laterality: Left;  . PACEMAKER IMPLANT    . PERIPHERAL VASCULAR CATHETERIZATION Left 04/08/2016   Procedure: A/V Shuntogram/Fistulagram;  Surgeon: Annice Needy, MD;  Location: [facility] INVASIVE CV LAB;  Service: Cardiovascular;  Laterality: Left;  . PERIPHERAL VASCULAR CATHETERIZATION N/A 04/08/2016   Procedure: A/V Shunt Intervention;  Surgeon: Annice Needy, MD;  Location: [facility] INVASIVE CV LAB;  Service: Cardiovascular;  Laterality: N/A;  . STONES     KIDNEY  . TONSILLECTOMY    . TUBAL LIGATION      Prior to Admission medications   Medication Sig Start Date End Date Taking? Authorizing Provider  aspirin EC 81 MG tablet Take 81 mg by mouth at bedtime.    [provider]  atorvastatin (LIPITOR) 80 MG tablet Take 80 mg by mouth every evening.    [provider]  bimatoprost (LUMIGAN) 0.03 % ophthalmic solution Place 1 drop into both eyes 2 (two) times daily.    [provider]  Brinzolamide-Brimonidine 1-0.2 % SUSP Apply 1 drop to eye 3 (three) times daily. LEFT EYE    [provider]  calcium acetate (PHOSLO) 667 MG capsule Take 1,334 mg by mouth 3 (three) times daily with meals.    [provider]  clopidogrel (PLAVIX) 75 MG tablet Take 1 tablet (75 mg total) by mouth daily. Patient not taking: Reported on 08/02/2017 06/13/17   Annice Needy, MD  diclofenac sodium (VOLTAREN)  1 % GEL Apply topically 4 (four) times daily.    [provider]  Difluprednate (DUREZOL) 0.05 % EMUL Apply 1 drop to eye. OPERATIVE EYE BID  2 DAYS PRIOR TO SURGERY  ONCE AM SURGERY    [provider]  Ferrous Sulfate 140 (45 Fe) MG TBCR Take 1 tablet by mouth daily.    [provider]  Fluticasone Propionate (FLONASE NA) Place 2 puffs into the nose daily.    [provider]  Ipratropium-Albuterol (COMBIVENT) 20-100 MCG/ACT AERS respimat Inhale 2 puffs into the lungs every 6 (six) hours.    [provider]  lisinopril (PRINIVIL,ZESTRIL) 10 MG tablet Take 1 tablet (10  mg total) by mouth daily. 10/30/15 08/02/17  Loura Pardon, NP  loperamide (IMODIUM) 2 MG capsule Take by mouth as needed for diarrhea or loose stools.    [provider]  Metoprolol Succinate 25 MG CS24 Take 25 mg by mouth daily.    [provider]  metoprolol tartrate (LOPRESSOR) 25 MG tablet Take 25 mg by mouth 2 (two) times daily.    [provider]  nitroGLYCERIN (NITROSTAT) 0.4 MG SL tablet Place 0.4 mg under the tongue every 5 (five) minutes as needed for chest pain.    [provider]  ondansetron (ZOFRAN) 4 MG tablet Take 4 mg by mouth every 8 (eight) hours as needed for nausea or vomiting.    [provider]  ticagrelor (BRILINTA) 60 MG TABS tablet Take by mouth.    [provider]  timolol (BETIMOL) 0.5 % ophthalmic solution Place into both eyes 2 (two) times daily.    [provider]  travoprost, benzalkonium, (TRAVATAN) 0.004 % ophthalmic solution Place 1 drop into both eyes at bedtime.    [provider]  Vitamin D, Ergocalciferol, (DRISDOL) 50000 units CAPS capsule Take 50,000 Units by mouth every 30 (thirty) days.    [provider]    Allergies Colchicine and Clopidogrel  Family History  Problem Relation Age of Onset  . Diabetes Mother     Social History Social History  Substance  Use Topics  . Smoking status: Former Smoker    Quit date: 06/10/1983  . Smokeless tobacco: Former Neurosurgeon  . Alcohol use No    Review of Systems Constitutional: No fever/chills Eyes: acute onset pain in left eye in the setting ENT: No sore throat. Cardiovascular: Denies chest pain. Respiratory: +shortness of breath since about midnight Gastrointestinal: No abdominal pain.  No nausea, no vomiting.  No diarrhea.  No constipation. Genitourinary: Negative for dysuria. Musculoskeletal: Negative for neck pain.  Negative for back pain. Integumentary: Negative for rash. Neurological: Negative for headaches, focal weakness or numbness.   ____________________________________________   PHYSICAL EXAM:  VITAL SIGNS: ED Triage Vitals  Enc Vitals Group     BP 08/08/17 0558 (!) 176/74     Pulse Rate 08/08/17 0558 91     Resp 08/08/17 0558 (!) 22     Temp 08/08/17 0558 98 F (36.7 C)     Temp Source 08/08/17 0558 Oral     SpO2 08/08/17 0558 100 %     Weight 08/08/17 0559 64 kg (141 lb 1.5 oz)     Height 08/08/17 0559 1.549 m ( )     Head Circumference --      Peak Flow --      Pain Score 08/08/17 0557 5     Pain Loc --      Pain Edu? --      Excl. in GC? --     Constitutional: Alert and oriented. Well appearing and in no acute distress. Eyes: Injected conjunctiva on the left.  Subacute bruising around the eye.   Head: Atraumatic. Nose: No congestion/rhinnorhea. Mouth/Throat: Mucous membranes are moist. Neck: No stridor.  No meningeal signs.   Cardiovascular: Normal rate, regular rhythm. Good peripheral circulation. Grossly normal heart sounds. Respiratory: Minimally increased respiratory effort.  No retractions. Lungs CTAB with diminished sounds in bases bilaterally.  No O2 requirement; was placed on 2L [place] for comfort. Gastrointestinal: Soft and nontender. No distention.  Musculoskeletal: No lower extremity tenderness nor edema. No gross deformities of extremities. Neurologic:   Normal speech  and language. No gross focal neurologic deficits are appreciated.  Skin:  Skin is warm, dry and intact. No rash noted. Psychiatric: Mood and affect are normal. Speech and behavior are normal.  ____________________________________________   LABS (all labs ordered are listed, but only abnormal results are displayed)  Labs Reviewed  COMPREHENSIVE METABOLIC PANEL - Abnormal; Notable for the following:       Result Value   Chloride 95 (*)    Glucose, Bld 292 (*)    BUN 53 (*)    Creatinine, Ser 7.57 (*)    AST 53 (*)    ALT 8 (*)    GFR calc non Af Amer 5 (*)    GFR calc Af Amer 6 (*)    All other components within normal limits  CBC WITH DIFFERENTIAL/PLATELET - Abnormal; Notable for the following:    RBC 3.29 (*)    Hemoglobin 10.1 (*)    HCT 30.3 (*)    RDW 15.6 (*)    Neutro Abs 8.0 (*)    All other components within normal limits  CULTURE, BLOOD (ROUTINE X 2)  LACTIC ACID, PLASMA  PROTIME-INR  TROPONIN I   ____________________________________________  EKG  ED ECG REPORT I, Mizraim Harmening, the attending physician, personally viewed and interpreted this ECG.  Date: 08/08/2017 EKG Time: 08:54 Rate: 89 Rhythm: normal sinus rhythm QRS Axis: normal Intervals: LVH, normal intervals ST/T Wave abnormalities: ST depression in leads V4-V6, likely demand ischemia in the setting of acute CHF exacerbation Narrative Interpretation: questionable demand ischemia - sequela of CHF exacerbation   ____________________________________________  RADIOLOGY   Dg Chest 2 View  Result Date: 08/08/2017 CLINICAL DATA:  Shortness of breath EXAM: CHEST  2 VIEW COMPARISON:  None. FINDINGS: There is diffuse interstitial opacity with Kerley lines. Trace pleural fluid. Borderline heart size. The patient is status post CABG and dual-chamber pacer implant. Negative aortic and hilar contours. Large lung volumes with diaphragm flattening. IMPRESSION: CHF. Electronically Signed   By:  Marnee Spring M.D.   On: 08/08/2017 07:08    ____________________________________________   PROCEDURES  Critical Care performed: No   Procedure(s) performed:   Procedures   ____________________________________________   INITIAL IMPRESSION / ASSESSMENT AND PLAN / ED COURSE  Pertinent labs & imaging results that were available during my care of the patient were reviewed by me and considered in my medical decision making (see chart for details).   initially there was concern that the patient may have signs or symptoms of sepsis.  However her workup is reassuring.  I reviewed prior medical records and her lab results from today as well as her EKG. she has what appears to be a CHF exacerbation on her chest x-ray, her lactate is within normal limits, her metabolic panel is essentially at her baseline, and she has no leukocytosis.  I strongly doubt that she has a bacterial or even viral infection and believe that her respiratory distress is caused by her need for dialysis to take off the extra fluid.  even though the differential diagnosis includes ACS, infection, PE, etc., I believe that CHF secondary to chronic renal failure is by far the most likely diagnosis and the one that I am currently pursuing.  Even that the treatment she needs is dialysis, her nurse is currently working by phone with the dialysis center to see if they can accommodate her this morning.  if they cannot do so, she will need to be admitted to the hospital for dialysis.  Additionally I will  touch base with ophthalmology to see if they will be able to see her in clinic today or in the hospital if she is admitted.  Clinical Course as of Aug 08 904  Mon Aug 08, 2017  1610 I spoke by phone with Dr. Druscilla Brownie who is on-call for the [facility].  He agreed that the patient needs to be seen today and would prefer to see her in clinic if she is able to be discharged to dialysis.  He said to tell her she can just show  up at the clinic after she is done with dialysis and she will be seen.  If she requires admission to the hospital (if they cannot accommodate her this morning at dialysis), the hospitalist can put in a consult and one of the ophthalmologist will see her in the hospital.  He requested that she continue to use her antibiotic and steroid drops.  She did not bring them with her, but her daughter-in-law said she will go to the house and bring them to her.  I asked her to do so so that she could have them and use them regardless of whether she stays or whether she is discharged.  We are awaiting word from the dialysis center about whether or not she will be able to be treated this morning.  [CF]  0850 we heard back from the dialysis center and I spoke by phone with Dr. Austin Miles the nephrologist.  I explained my evaluation and workup and assessment that the patient needs dialysis for her CHF.  She asked about additional workup including cardiac echo and her enzymes.  I explained that I did not check a troponin given that the patient has had no chest pain and that a slightly elevated troponin can be quite misleading in an end-stage renal patient.  I expressed that I feel the patient is stable to be sent back to dialysis for the treatment that should address her current issue.  Unfortunately I was told that the dialysis center cannot accommodate the patient until at least 1 PM at the earliest.  I do not think this is acceptable or appropriate given that the patient has uncontrolled hypertension and shortness of breath from the CHF which should be addressed with urgent dialysis.  Given that they are not able to accommodate her until the afternoon, I have asked the hospitalist, Dr. Enedina Finner, to admit the patient for dialysis.  She will contact the nephrologist and arrange for urgent dialysis.   I put in a consult order for ophthalmology since the patient will not be able to go to the eye center today.  I also put in an order  requesting that the patient be allowed to use her own postoperative eyedrops.  I added on a troponin since the patient will be staying in the hospital.  [CF]    Clinical Course User Index [CF] Loleta Rose, MD    ____________________________________________  FINAL CLINICAL IMPRESSION(S) / ED DIAGNOSES  Final diagnoses:  Acute on chronic congestive heart failure, unspecified heart failure type (HCC)  End stage renal disease on dialysis (HCC)  Acute left eye pain  Uncontrolled hypertension  Shortness of breath     MEDICATIONS GIVEN DURING THIS VISIT:  Medications  labetalol (NORMODYNE,TRANDATE) injection 10 mg (not administered)     NEW OUTPATIENT MEDICATIONS STARTED DURING THIS VISIT:  New Prescriptions   No medications on file    Modified Medications   No medications on file    Discontinued Medications  No medications on file     Note:  This document was prepared using Dragon voice recognition software and may include unintentional dictation errors.    Loleta Rose, MD 08/08/17 [phone]

## 2017-08-08 NOTE — ED Notes (Signed)
Pt vomited approx 200 ml of emesis.

## 2017-08-09 ENCOUNTER — Inpatient Hospital Stay (HOSPITAL_COMMUNITY)
Admit: 2017-08-09 | Discharge: 2017-08-09 | Disposition: A | Discharge status: Home / Self Care | Payer: Medicare (Managed Care) | Attending: Physician Assistant | Admitting: Physician Assistant

## 2017-08-09 DIAGNOSIS — I340 Nonrheumatic mitral (valve) insufficiency: Secondary | ICD-10-CM

## 2017-08-09 DIAGNOSIS — I5031 Acute diastolic (congestive) heart failure: Secondary | ICD-10-CM

## 2017-08-09 DIAGNOSIS — I161 Hypertensive emergency: Secondary | ICD-10-CM

## 2017-08-09 DIAGNOSIS — N186 End stage renal disease: Secondary | ICD-10-CM

## 2017-08-09 LAB — ECHOCARDIOGRAM COMPLETE
Height: 61 in
WEIGHTICAEL: 2257.51 [oz_av]

## 2017-08-09 LAB — GLUCOSE, CAPILLARY
Glucose-Capillary: 202 mg/dL — ABNORMAL HIGH (ref 65–99)
Glucose-Capillary: 147 mg/dL — ABNORMAL HIGH (ref 65–99)

## 2017-08-09 LAB — TROPONIN I: Troponin I: 0.8 ng/mL (ref <0.03)

## 2017-08-09 MED ORDER — OXYCODONE-ACETAMINOPHEN 5-325 MG PO TABS
1.0000 | ORAL_TABLET | Freq: Once | ORAL | Status: AC
  Administered 2017-08-09: 1 via ORAL
  Filled 2017-08-09: qty 1

## 2017-08-09 MED ORDER — IRBESARTAN 150 MG PO TABS: 150.0000 mg | ORAL_TABLET | Freq: Every day | ORAL | 1 refills | Status: DC

## 2017-08-09 MED ORDER — TRAMADOL HCL 50 MG PO TABS: 50.0000 mg | ORAL_TABLET | Freq: Three times a day (TID) | ORAL | 0 refills | Status: DC | PRN

## 2017-08-09 NOTE — Discharge Summary (Signed)
[facility] [place] [facility]   PATIENT NAME: Nichole Hurst    MR#:  [id number]  DATE OF BIRTH:  1951/05/19  DATE OF ADMISSION:  08/08/2017 ADMITTING PHYSICIAN: Enedina Finner, MD  DATE OF DISCHARGE: 08/09/17  PRIMARY CARE PHYSICIAN: Bobbye Morton, MD    ADMISSION DIAGNOSIS:  Shortness of breath [R06.02] End stage renal disease on dialysis (HCC) [N18.6, Z99.2] Uncontrolled hypertension [I10] Acute left eye pain [H57.12] Acute on chronic congestive heart failure, unspecified heart failure type (HCC) [I50.9] Pulmonary edema [J81.1]  DISCHARGE DIAGNOSIS:  Acute on chronic CHF-diastolic Acute Pulmonary edema---s/p urgent HD with ultrafiltration ESRD on HD HTN  SECONDARY DIAGNOSIS:   Past Medical History:  Diagnosis Date  . Anemia of chronic disease   . CAD (coronary artery disease)    a. s/p 2-V CABG 05/2016 (LIMA-LAD, VG-OM1)  . Carotid stenosis   . Charcot's gait    FOOT  . Claustrophobia   . Colon polyp   . Diabetes mellitus with complication (HCC)   . Emphysema/COPD (HCC)   . ESRD (end stage renal disease) on dialysis (HCC)    DIAYLISIS M/W/F  . Fractures    Lt foot  . GERD (gastroesophageal reflux disease)   . Glaucoma   . History of kidney stones   . Hyperlipidemia   . Hypertension   . Migraine   . Orthopnea   . Oxygen deficiency    AS NEEDED  . PAD (peripheral artery disease) (HCC)   . Pain    JOINTS  . Presence of permanent cardiac pacemaker    [employer]   . Retinopathy    DIABETIC  . Stroke [facility]) 09/2015   Ischemic stroke with R sided hemiparesis    HOSPITAL COURSE:   Nichole Hurst  is a 66 y.o. female with a known history of end-stage renal disease on hemodialysis,coronary artery disease status post CABG, h/oTACHYBRADYCARDIA SYNDROME S/P POST DUAL PACEMAKER July 2017, peripheral artery disease status post angioplasty of the left PTA and ATA comes to the emergency room with increasing shortness of breath. Patient went  to dialysis was found to be short of breath with elevated blood pressure sent to the emergency room was found to be pulmonary edema/volume overload/acute on chronic diastolic congestive heart failure  1. Acute hypoxic respiratory failure secondary to acute on chronic diastolic congestive heart failure/pulmonary edema/volume overload -Nephrology consultation for urgent hemodialysis and ultrafiltration with 2100 cc removed -Monitor I's and O's -Continue cardiac meds  2. Elevated troponin without chest pain---appears demand ischemia in the setting of #1 -EKG shows ST depression lateral leads appears likely LVH in the setting of malignant hypertension -Troponin  3 -Continue aspirin -cardiac consultation appreciated. No furhter w/u since appears demand ischemia with CHF and elevated HTN  3. Malignant hypertension -Continue Toprol-XL, added irbesartan by nephrology -when necessary hydralazine  4.End-stage renal disease on hemodialysis -Nephrology consult appreciated  5. Coronary artery disease status post CABG -Continue aspirin -Continue brilinta 60 mg bid  7. Hyperlipidemia -Continue Lipitor  8. DVT prophylaxis subcutaneous heparin  9. S/p left eye surgery seen by Dr Druscilla Brownie. Recommends cont ED and f/u as out pt  Informed [name] with Dr Chevis Pretty D/w sister D/c home CONSULTS OBTAINED:  Treatment Team:  Iran Ouch, MD  DRUG ALLERGIES:   Allergies  Allergen Reactions  . Colchicine Anaphylaxis  . Clopidogrel     DISCHARGE MEDICATIONS:   Current Discharge Medication List    START taking these medications   Details  irbesartan (  AVAPRO) 150 MG tablet Take 1 tablet (150 mg total) by mouth at bedtime. Qty: 30 tablet, Refills: 1    traMADol (ULTRAM) 50 MG tablet Take 1 tablet (50 mg total) by mouth 3 (three) times daily as needed for moderate pain or severe pain. Qty: 15 tablet, Refills: 0      CONTINUE these medications which have NOT CHANGED   Details   aspirin EC 81 MG tablet Take 81 mg by mouth at bedtime.    atorvastatin (LIPITOR) 80 MG tablet Take 80 mg by mouth every evening.    bimatoprost (LUMIGAN) 0.03 % ophthalmic solution Place 1 drop into both eyes 2 (two) times daily.    Brinzolamide-Brimonidine 1-0.2 % SUSP Apply 1 drop to eye 3 (three) times daily. LEFT EYE    calcium acetate (PHOSLO) 667 MG capsule Take 1,334 mg by mouth 3 (three) times daily with meals.    diclofenac sodium (VOLTAREN) 1 % GEL Apply topically 4 (four) times daily.    Ferrous Sulfate 140 (45 Fe) MG TBCR Take 1 tablet by mouth daily.    Fluticasone Propionate (FLONASE NA) Place 2 puffs into the nose daily.    Ipratropium-Albuterol (COMBIVENT) 20-100 MCG/ACT AERS respimat Inhale 2 puffs into the lungs every 6 (six) hours.    loperamide (IMODIUM) 2 MG capsule Take by mouth as needed for diarrhea or loose stools.    Metoprolol Succinate 25 MG CS24 Take 25 mg by mouth daily.    moxifloxacin (VIGAMOX) 0.5 % ophthalmic solution Place 1 drop into the left eye 4 (four) times daily.    nitroGLYCERIN (NITROSTAT) 0.4 MG SL tablet Place 0.4 mg under the tongue every 5 (five) minutes as needed for chest pain.    ondansetron (ZOFRAN) 4 MG tablet Take 4 mg by mouth every 8 (eight) hours as needed for nausea or vomiting.    ticagrelor (BRILINTA) 60 MG TABS tablet Take 60 mg by mouth 2 (two) times daily.     timolol (BETIMOL) 0.5 % ophthalmic solution Place 1 drop into the left eye 2 (two) times daily.     Vitamin D, Ergocalciferol, (DRISDOL) 50000 units CAPS capsule Take 50,000 Units by mouth every 30 (thirty) days.    Difluprednate (DUREZOL) 0.05 % EMUL Place 1 drop into the left eye 4 (four) times daily.       STOP taking these medications     lisinopril (PRINIVIL,ZESTRIL) 10 MG tablet         If you experience worsening of your admission symptoms, develop shortness of breath, life threatening emergency, suicidal or homicidal thoughts you must seek  medical attention immediately by calling 911 or calling your MD immediately  if symptoms less severe.  You Must read complete instructions/literature along with all the possible adverse reactions/side effects for all the Medicines you take and that have been prescribed to you. Take any new Medicines after you have completely understood and accept all the possible adverse reactions/side effects.   Please note  You were cared for by a hospitalist during your hospital stay. If you have any questions about your discharge medications or the care you received while you were in the hospital after you are discharged, you can call the unit and asked to speak with the hospitalist on call if the hospitalist that took care of you is not available. Once you are discharged, your primary care physician will handle any further medical issues. Please note that NO REFILLS for any discharge medications will be authorized once you are discharged,  as it is imperative that you return to your primary care physician (or establish a relationship with a primary care physician if you do not have one) for your aftercare needs so that they can reassess your need for medications and monitor your lab values. Today   SUBJECTIVE   Doing ok with breathing  VITAL SIGNS:  Blood pressure (!) 169/66, pulse 79, temperature 97.7 F (36.5 C), temperature source Oral, resp. rate 16, height  (1.549 m), weight 64 kg (141 lb 1.5 oz), SpO2 100 %.  I/O:   Intake/Output Summary (Last 24 hours) at 08/09/17 1214 Last data filed at 08/09/17 1014  Gross per 24 hour  Intake                0 ml  Output             2100 ml  Net            -2100 ml    PHYSICAL EXAMINATION:  GENERAL:  66 y.o.-year-old patient lying in the bed with no acute distress.  EYES: Pupils equal, round, reactive to light and accommodation. No scleral icterus. Extraocular muscles intact. S/p left eye surgery HEENT: Head atraumatic, normocephalic. Oropharynx and  nasopharynx clear.  NECK:  Supple, no jugular venous distention. No thyroid enlargement, no tenderness.  LUNGS: Normal breath sounds bilaterally, no wheezing, rales,rhonchi or crepitation. No use of accessory muscles of respiration.  CARDIOVASCULAR: S1, S2 normal. No murmurs, rubs, or gallops.  ABDOMEN: Soft, non-tender, non-distended. Bowel sounds present. No organomegaly or mass.  EXTREMITIES: No pedal edema, cyanosis, or clubbing.  NEUROLOGIC: Cranial nerves II through XII are intact. Muscle strength 5/5 in all extremities. Sensation intact. Gait not checked.  PSYCHIATRIC: The patient is alert and oriented x 3.  SKIN: No obvious rash, lesion, or ulcer.   DATA REVIEW:   CBC   Recent Labs Lab 08/08/17 0605  WBC 10.1  HGB 10.1*  HCT 30.3*  PLT 207    Chemistries   Recent Labs Lab 08/08/17 0605  NA 137  K 4.4  CL 95*  CO2 27  GLUCOSE 292*  BUN 53*  CREATININE 7.57*  CALCIUM 9.5  AST 53*  ALT 8*  ALKPHOS 73  BILITOT 0.7    Microbiology Results   Recent Results (from the past 240 hour(s))  Culture, blood (Routine x 2)     Status: None (Preliminary result)   Collection Time: 08/08/17  6:32 AM  Result Value Ref Range Status   Specimen Description BLOOD RIGHT AC  Final   Special Requests   Final    BOTTLES DRAWN AEROBIC AND ANAEROBIC Blood Culture adequate volume   Culture NO GROWTH 1 DAY  Final   Report Status PENDING  Incomplete  MRSA PCR Screening     Status: None   Collection Time: 08/08/17  3:59 PM  Result Value Ref Range Status   MRSA by PCR NEGATIVE NEGATIVE Final    Comment:        The GeneXpert MRSA Assay (FDA approved for NASAL specimens only), is one component of a comprehensive MRSA colonization surveillance program. It is not intended to diagnose MRSA infection nor to guide or monitor treatment for MRSA infections.     RADIOLOGY:  Dg Chest 2 View  Result Date: 08/08/2017 CLINICAL DATA:  Shortness of breath EXAM: CHEST  2 VIEW COMPARISON:   None. FINDINGS: There is diffuse interstitial opacity with Kerley lines. Trace pleural fluid. Borderline heart size. The patient is status post CABG  and dual-chamber pacer implant. Negative aortic and hilar contours. Large lung volumes with diaphragm flattening. IMPRESSION: CHF. Electronically Signed   By: Marnee Spring M.D.   On: 08/08/2017 07:08     Management plans discussed with the patient, family and they are in agreement.  CODE STATUS:     Code Status Orders        Start     Ordered   08/08/17 1037  Full code  Continuous     08/08/17 1036    Code Status History    Date Active Date Inactive Code Status Order ID Comments User Context   06/13/2017 11:00 AM 06/13/2017  4:02 PM Full Code [id number]  Annice Needy, MD Inpatient      TOTAL TIME TAKING CARE OF THIS PATIENT: *40* minutes.    Billal Rollo M.D on 08/09/2017 at 12:14 PM  Between 7am to 6pm - Pager - [phone] After 6pm go to [email or web address] - Social research officer, government  [facility]  Office  [phone]  CC: Primary care physician; Bobbye Morton, MD

## 2017-08-09 NOTE — Evaluation (Addendum)
Physical Therapy Evaluation Patient Details Name: Nichole Hurst MRN: [id number] DOB: 1951/09/12 Today's Date: 08/09/2017   History of Present Illness  Pt is a 66 y.o. female presenting to hospital with SOB (via EMS from dialysis center).  Pt with recent shunt placement L eye.  Pt admitted with acute hypoxic respiratory failure secondary to acute on chronic diastolic CHF/pulmonary edema/volume overload, and malignant htn.  Pt s/p urgent HD with ultrafiltration.  PMH: ESRD on HD MWF, anemia, Charcot's foot, CHF, DM, claustrophobia, dementia, fx L foot, O2 PRN, pacemaker, stroke.  Clinical Impression  Prior to hospital admission, pt has been modified independent w/c level functional mobility for past 3 months d/t L foot fx; also is part of [facility] program.  Pt lives alone in 1 level apt with level entry.  Currently pt is modified independent with bed mobility and transfers bed to/from recliner.  Pt's O2 decreased to 87-88% on room air after transfers and pt placed on 1.5 L O2 via nasal cannula and O2 increased up to 98% (nursing notified).  Pt appears at recent baseline level of functional mobility (w/c level) and does not require ongoing therapy at this time.  Anticipate once pt's WB'ing status changes, pt would benefit from PT (pt reports she has access to PT at [facility] program).  Will discharge pt in house and complete current PT order.    Follow Up Recommendations No PT follow up    Equipment Recommendations   (pt already has manual w/c at home)    Recommendations for Other Services       Precautions / Restrictions Precautions Precautions: Fall Restrictions Weight Bearing Restrictions: Yes Other Position/Activity Restrictions: Per pt, she is mostly NWB'ing L LE d/t h/o foot fx about 5 months ago but says MD told her she can WB some on her L heel (with L boot in place) to transfer but no walking      Mobility  Bed Mobility Overal bed mobility: Modified Independent             General bed  mobility comments: Supine to/from sit with HOB elevated without difficulties (pt reports normally sleeping on 3 pillows d/t difficulty breathing)  Transfers Overall transfer level: Modified independent Equipment used: None             General transfer comment: stand pivot on R LE bed to/from recliner safely without any noted difficulties  Ambulation/Gait             General Gait Details: Deferred d/t pt non-ambulatory at this time per pt report  Stairs            Wheelchair Mobility    Modified Rankin (Stroke Patients Only)       Balance Overall balance assessment: No apparent balance deficits (not formally assessed)                                           Pertinent Vitals/Pain Pain Assessment: 0-10 Pain Score: 5  Pain Location: L eye s/p recent surgery Pain Descriptors / Indicators: Sore Pain Intervention(s): Limited activity within patient's tolerance;Monitored during session;Repositioned  HR WFL during session.    Home Living Family/patient expects to be discharged to:: Private residence Living Arrangements: Alone Available Help at Discharge: Family Type of Home: Apartment Home Access: Level entry     Home Layout: One level Home Equipment: Wheelchair - manual;Grab bars - tub/shower;Grab bars -  toilet      Prior Function Level of Independence: Independent with assistive device(s)         Comments: Modified independent manual w/c level for past 3 months (pt reports she walked on fx'ed L foot for 2 months prior to getting imaging and finding out it was fractured)     Hand Dominance        Extremity/Trunk Assessment   Upper Extremity Assessment Upper Extremity Assessment: Overall WFL for tasks assessed    Lower Extremity Assessment Lower Extremity Assessment: Overall WFL for tasks assessed (L lower leg deferred d/t fx)    Cervical / Trunk Assessment Cervical / Trunk Assessment: Normal  Communication    Communication: No difficulties  Cognition Arousal/Alertness: Awake/alert Behavior During Therapy: WFL for tasks assessed/performed Overall Cognitive Status: Within Functional Limits for tasks assessed                                        General Comments   Nursing cleared pt for participation in physical therapy.  Pt agreeable to PT session.    Exercises     Assessment/Plan    PT Assessment Patent does not need any further PT services  PT Problem List         PT Treatment Interventions      PT Goals (Current goals can be found in the Care Plan section)  Acute Rehab PT Goals Patient Stated Goal: to go home PT Goal Formulation: With patient Time For Goal Achievement: 08/23/17 Potential to Achieve Goals: Good    Frequency     Barriers to discharge        Co-evaluation               AM-PAC PT "6 Clicks" Daily Activity  Outcome Measure Difficulty turning over in bed (including adjusting bedclothes, sheets and blankets)?: None Difficulty moving from lying on back to sitting on the side of the bed? : None Difficulty sitting down on and standing up from a chair with arms (e.g., wheelchair, bedside commode, etc,.)?: None Help needed moving to and from a bed to chair (including a wheelchair)?: None Help needed walking in hospital room?: A Lot Help needed climbing 3-5 steps with a railing? : Total 6 Click Score: 19    End of Session Equipment Utilized During Treatment: Other (comment) (L LE boot) Activity Tolerance: Patient tolerated treatment well Patient left: in bed;with call bell/phone within reach;with bed alarm set;with family/visitor present Nurse Communication: Mobility status;Precautions;Weight bearing status (Pt's pain status and O2 status during session.) PT Visit Diagnosis: Muscle weakness (generalized) (M62.81)    Time: 1610-9604 PT Time Calculation (min) (ACUTE ONLY): 15 min   Charges:   PT Evaluation $PT Eval Low Complexity: 1  Low     PT G Codes:   PT G-Codes **NOT FOR INPATIENT CLASS** Functional Assessment Tool Used: AM-PAC 6 Clicks Basic Mobility Functional Limitation: Mobility: Walking and moving around Mobility: Walking and Moving Around Current Status (V4098): At least 20 percent but less than 40 percent impaired, limited or restricted Mobility: Walking and Moving Around Goal Status [phone]): At least 20 percent but less than 40 percent impaired, limited or restricted Mobility: Walking and Moving Around Discharge Status [phone]): At least 20 percent but less than 40 percent impaired, limited or restricted    Hendricks Limes, PT 08/09/17, 3:23 PM [phone]

## 2017-08-09 NOTE — Progress Notes (Signed)
Okay for RN to order Percocet 5-325 mg once dose tablet per Dr. Allena Katz order.

## 2017-08-09 NOTE — Progress Notes (Signed)
Date: 08/09/2017                  Patient Name:  Nichole Hurst  MRN: [id number]  DOB: 28-Oct-1951  Age / Sex: 66 y.o., female         PCP: Bobbye Morton, MD                 S: Patient underwent hemodialysis yesterday.  Feels better today.  Still requiring oxygen by nasal cannula.  Able to eat better although appetite is poor.  2 L of fluid was removed with dialysis yesterday.  Blood pressure control has improved significantly   Medications:   Current medications: Current Facility-Administered Medications  Medication Dose Route Frequency Provider Last Rate Last Dose  . acetaminophen (TYLENOL) tablet 650 mg  650 mg Oral Q6H PRN Enedina Finner, MD   650 mg at 08/08/17 1049   Or  . acetaminophen (TYLENOL) suppository 650 mg  650 mg Rectal Q6H PRN Enedina Finner, MD      . albuterol (PROVENTIL) (2.5 MG/3ML) 0.083% nebulizer solution 2.5 mg  2.5 mg Nebulization Q6H PRN Enedina Finner, MD      . aspirin EC tablet 81 mg  81 mg Oral QHS Enedina Finner, MD   81 mg at 08/08/17 2139  . atorvastatin (LIPITOR) tablet 80 mg  80 mg Oral QPM Enedina Finner, MD   80 mg at 08/08/17 1724  . brinzolamide (AZOPT) 1 % ophthalmic suspension 1 drop  1 drop Left Eye TID Enedina Finner, MD   1 drop at 08/09/17 1126   And  . brimonidine (ALPHAGAN) 0.2 % ophthalmic solution 1 drop  1 drop Left Eye TID Enedina Finner, MD   1 drop at 08/09/17 1126  . calcium acetate (PHOSLO) capsule 1,334 mg  1,334 mg Oral TID WC Enedina Finner, MD   1,334 mg at 08/09/17 1201  . Difluprednate 0.05 % EMUL 1 drop  1 drop Left Eye QID Enedina Finner, MD   1 drop at 08/09/17 1350  . ferrous sulfate tablet 325 mg  325 mg Oral Q breakfast Enedina Finner, MD   325 mg at 08/09/17 0841  . fluticasone (FLONASE) 50 MCG/ACT nasal spray 1 spray  1 spray Each Nare Daily Enedina Finner, MD   1 spray at 08/09/17 1124  . heparin injection 5,000 Units  5,000 Units Subcutaneous Q8H Enedina Finner, MD   5,000 Units at 08/09/17 1343  . insulin aspart (novoLOG) injection 0-5 Units  0-5  Units Subcutaneous QHS Enedina Finner, MD   2 Units at 08/08/17 2230  . insulin aspart (novoLOG) injection 0-9 Units  0-9 Units Subcutaneous TID WC Enedina Finner, MD   1 Units at 08/09/17 1203  . ipratropium-albuterol (DUONEB) 0.5-2.5 (3) MG/3ML nebulizer solution 3 mL  3 mL Nebulization Q6H Enedina Finner, MD   3 mL at 08/09/17 1343  . irbesartan (AVAPRO) tablet 150 mg  150 mg Oral QHS Mosetta Pigeon, MD   150 mg at 08/08/17 2148  . latanoprost (XALATAN) 0.005 % ophthalmic solution 1 drop  1 drop Both Eyes QHS Enedina Finner, MD   1 drop at 08/08/17 2145  . metoprolol succinate (TOPROL-XL) 24 hr tablet 50 mg  50 mg Oral Daily Lorine Bears A, MD   50 mg at 08/09/17 1123  . moxifloxacin (VIGAMOX) 0.5 % ophthalmic solution 1 drop  1 drop Left Eye QID Enedina Finner, MD   1 drop at 08/09/17 1344  . nitroGLYCERIN (NITROSTAT) SL tablet 0.4 mg  0.4  mg Sublingual Q5 min PRN Enedina Finner, MD      . ondansetron [facility]) tablet 4 mg  4 mg Oral Q6H PRN Enedina Finner, MD       Or  . ondansetron [facility]) injection 4 mg  4 mg Intravenous Q6H PRN Enedina Finner, MD   4 mg at 08/08/17 2017  . ondansetron (ZOFRAN) tablet 4 mg  4 mg Oral Q8H PRN Enedina Finner, MD      . polyethylene glycol (MIRALAX / GLYCOLAX) packet 17 g  17 g Oral Daily PRN Enedina Finner, MD      . ticagrelor [facility]) tablet 60 mg  60 mg Oral BID Enedina Finner, MD   60 mg at 08/09/17 1121  . timolol (TIMOPTIC) 0.5 % ophthalmic solution 1 drop  1 drop Left Eye BID Enedina Finner, MD   1 drop at 08/09/17 1125  . traMADol (ULTRAM) tablet 50 mg  50 mg Oral TID PRN Enedina Finner, MD   50 mg at 08/09/17 0841  . Vitamin D (Ergocalciferol) (DRISDOL) capsule 50,000 Units  50,000 Units Oral Q30 days Enedina Finner, MD   50,000 Units at 08/09/17 1121      Allergies: Allergies  Allergen Reactions  . Colchicine Anaphylaxis  . Clopidogrel       Vital Signs: Blood pressure (!) 160/62, pulse 74, temperature 98.4 F (36.9 C), temperature source Oral, resp. rate 16, height   (1.549 m), weight 64 kg (141 lb 1.5 oz), SpO2 98 %.   Intake/Output Summary (Last 24 hours) at 08/09/17 1444 Last data filed at 08/09/17 1014  Gross per 24 hour  Intake                0 ml  Output              100 ml  Net             -100 ml    Weight trends: [employer]   08/08/17 0559  Weight: 64 kg (141 lb 1.5 oz)    Physical Exam: General: no acute distress  HEENT Left periorbital ecchymosis,  Neck: Supple, no masses  Lungs: Mild Diffuse bilateral crackles at bases, improved exam  Heart:: irregular, soft systolic murmur present  Abdomen: Soft. Nontender, nondistended  Extremities:  no peripheral edema  Neurologic: Alert, oriented  Skin: No acute rashes  Access: Left upper arm AV fistula          Lab results: Basic Metabolic Panel:  Recent Labs Lab 08/04/17 0925 08/08/17 0605  NA 140 137  K 4.0 4.4  CL  --  95*  CO2  --  27  GLUCOSE 206* 292*  BUN  --  53*  CREATININE  --  7.57*  CALCIUM  --  9.5    Liver Function Tests:  Recent Labs Lab 08/08/17 0605  AST 53*  ALT 8*  ALKPHOS 73  BILITOT 0.7  PROT 7.9  ALBUMIN 4.2   No results for input(s): LIPASE, AMYLASE in the last 168 hours. No results for input(s): AMMONIA in the last 168 hours.  CBC:  Recent Labs Lab 08/04/17 0925 08/08/17 0605  WBC  --  10.1  NEUTROABS  --  8.0*  HGB 11.2* 10.1*  HCT 33.0* 30.3*  MCV  --  92.0  PLT  --  207    Cardiac Enzymes:  Recent Labs Lab 08/09/17 0354  TROPONINI 0.80*    BNP: Invalid input(s): POCBNP  CBG:  Recent Labs Lab 08/08/17 1505 08/08/17 1856  08/08/17 2139 08/09/17 0755 08/09/17 1135  GLUCAP 125* 180* 221* 202* 147*    Microbiology: Recent Results (from the past 720 hour(s))  Culture, blood (Routine x 2)     Status: None (Preliminary result)   Collection Time: 08/08/17  6:32 AM  Result Value Ref Range Status   Specimen Description BLOOD RIGHT AC  Final   Special Requests   Final    BOTTLES DRAWN AEROBIC AND ANAEROBIC  Blood Culture adequate volume   Culture NO GROWTH 1 DAY  Final   Report Status PENDING  Incomplete  MRSA PCR Screening     Status: None   Collection Time: 08/08/17  3:59 PM  Result Value Ref Range Status   MRSA by PCR NEGATIVE NEGATIVE Final    Comment:        The GeneXpert MRSA Assay (FDA approved for NASAL specimens only), is one component of a comprehensive MRSA colonization surveillance program. It is not intended to diagnose MRSA infection nor to guide or monitor treatment for MRSA infections.      Coagulation Studies:  Recent Labs  08/08/17 0605  LABPROT 15.0  INR 1.19    Urinalysis: No results for input(s): COLORURINE, LABSPEC, PHURINE, GLUCOSEU, HGBUR, BILIRUBINUR, KETONESUR, PROTEINUR, UROBILINOGEN, NITRITE, LEUKOCYTESUR in the last 72 hours.  Invalid input(s): APPERANCEUR      Imaging: Dg Chest 2 View  Result Date: 08/08/2017 CLINICAL DATA:  Shortness of breath EXAM: CHEST  2 VIEW COMPARISON:  None. FINDINGS: There is diffuse interstitial opacity with Kerley lines. Trace pleural fluid. Borderline heart size. The patient is status post CABG and dual-chamber pacer implant. Negative aortic and hilar contours. Large lung volumes with diaphragm flattening. IMPRESSION: CHF. Electronically Signed   By: Marnee Spring M.D.   On: 08/08/2017 07:08     Assessment & Plan: Pt is a 66 y.o.   female with ESRD on HD for 2-3 years, [facility] [address], MWF,Stroke 2016, residual rt sided weakness improved now, CABG- [facility], 2017 , Pacemaker , COPD, Left foot fracture, Left eye surgery Friday, was admitted on 08/08/2017 with SOB.   [facility] [address]/ [facility]/ MWF  1. ESRD 2. SOB - acute pulm Edema 3. Malignant HTN 4.  Anemia of chronic kidney disease 5.  Secondary hyperparathyroidism  Plan: - clinical condition and blood pressure has improved significantly with volume removal and administration of antihypertensives Patient still has mild residual pulmonary edema.  Offered her an  extra hemodialysis treatment today.  Patient declined - Plan for dialysis tomorrow (as outpatient as patient is scheduled to be d/c today)

## 2017-08-09 NOTE — Progress Notes (Signed)
Patient Name: Nichole Hurst Date of Encounter: 08/09/2017  Primary Cardiologist: [facility] Problem List     Active Problems:   Pulmonary edema     Subjective   SOB improved s/p HD. No further chest pain. TTE pending. Troponin peaked at 1.07, now down trending.   Inpatient Medications    Scheduled Meds: . aspirin EC  81 mg Oral QHS  . atorvastatin  80 mg Oral QPM  . brinzolamide  1 drop Left Eye TID   And  . brimonidine  1 drop Left Eye TID  . calcium acetate  1,334 mg Oral TID WC  . Difluprednate  1 drop Left Eye QID  . ferrous sulfate  325 mg Oral Q breakfast  . fluticasone  1 spray Each Nare Daily  . heparin  5,000 Units Subcutaneous Q8H  . insulin aspart  0-5 Units Subcutaneous QHS  . insulin aspart  0-9 Units Subcutaneous TID WC  . ipratropium-albuterol  3 mL Nebulization Q6H  . irbesartan  150 mg Oral QHS  . latanoprost  1 drop Both Eyes QHS  . metoprolol succinate  50 mg Oral Daily  . moxifloxacin  1 drop Left Eye QID  . ticagrelor  60 mg Oral BID  . timolol  1 drop Left Eye BID  . Vitamin D (Ergocalciferol)  50,000 Units Oral Q30 days   Continuous Infusions:  PRN Meds: acetaminophen **OR** acetaminophen, albuterol, nitroGLYCERIN, ondansetron **OR** ondansetron (ZOFRAN) IV, ondansetron, polyethylene glycol, traMADol   Vital Signs    Vitals:   08/08/17 1506 08/08/17 1956 08/09/17 0426 08/09/17 0946  BP: 140/61 (!) 153/64 129/69 (!) 158/61  Pulse: 86 89 79 83  Resp: Temp: 98.7 F (37.1 C) 98.2 F (36.8 C) 97.6 F (36.4 C) 97.7 F (36.5 C)  TempSrc: Oral Oral Oral Oral  SpO2: 96% 93% (!) 89% 100%  Weight:      Height:        Intake/Output Summary (Last 24 hours) at 08/09/17 1021 Last data filed at 08/09/17 1014  Gross per 24 hour  Intake                0 ml  Output             2100 ml  Net            -2100 ml   Filed Weights   08/08/17 0559  Weight: 141 lb 1.5 oz (64 kg)    Physical Exam    GEN: Well nourished, well  developed, in no acute distress.  HEENT: Grossly normal.  Neck: Supple, no JVD, carotid bruits, or masses. Cardiac: RRR, no murmurs, rubs, or gallops. No clubbing, cyanosis, edema.  Radials/DP/PT 2+ and equal bilaterally.  Respiratory:  Improved bilaterally. GI: Soft, nontender, nondistended, BS + x 4. MS: no deformity or atrophy. Skin: warm and dry, no rash. Neuro:  Strength and sensation are intact. Psych: AAOx3.  Normal affect.  Labs    CBC  Recent Labs  08/08/17 0605  WBC 10.1  NEUTROABS 8.0*  HGB 10.1*  HCT 30.3*  MCV 92.0  PLT 207   Basic Metabolic Panel  Recent Labs  08/08/17 0605  NA 137  K 4.4  CL 95*  CO2 27  GLUCOSE 292*  BUN 53*  CREATININE 7.57*  CALCIUM 9.5   Liver Function Tests  Recent Labs  08/08/17 0605  AST 53*  ALT 8*  ALKPHOS 73  BILITOT 0.7  PROT 7.9  ALBUMIN 4.2   No results for input(s): LIPASE, AMYLASE in the last 72 hours. Cardiac Enzymes  Recent Labs  08/08/17 1541 08/08/17 2152 08/09/17 0354  TROPONINI 1.07* 0.79* 0.80*   BNP Invalid input(s): POCBNP D-Dimer No results for input(s): DDIMER in the last 72 hours. Hemoglobin A1C No results for input(s): HGBA1C in the last 72 hours. Fasting Lipid Panel No results for input(s): CHOL, HDL, LDLCALC, TRIG, CHOLHDL, LDLDIRECT in the last 72 hours. Thyroid Function Tests No results for input(s): TSH, T4TOTAL, T3FREE, THYROIDAB in the last 72 hours.  Invalid input(s): FREET3  Telemetry    NSR, intermittent pacing - Personally Reviewed  ECG    n/a - Personally Reviewed  Radiology    Dg Chest 2 View  Result Date: 08/08/2017 IMPRESSION: CHF. Electronically Signed   By: Marnee Spring M.D.   On: 08/08/2017 07:08    Cardiac Studies   TTE pending  Patient Profile     66 y.o. female with history of CAD s/p 2-vessel CABG ([id number]) in 05/2016, tachy-brady syndrome s/p SJM PPM in 05/2016, ESRD on HD (MWF), PAD s/p angioplasty of the left PTA/left ATA, anemia  of chronic disease, DM2, HTN, and HLD who presented to [facility] with increased SOB.  Assessment & Plan    1. Elevated troponin/abnormal EKG: -Currently chest pain free -Chest pain is somewhat atypical in that it has been reproducible to palpation -Troponin peaked at 1.07, now down trending -Await TTE today, if there is a newly reduced EF or WMA she would benefit from [facility] prior to discharge -If TTE shows a preserved EF and normal WM, would pursue Lexiscan Myoview prior to discharge to evaluate for high-risk ischemia -Hold heparin gtt  2. CAD: -As above -Continue PTA medications  3. Acute diastolic CHF: -Feeling better s/p HD -She does continue to appear volume up -Consider repeat HD today vs taking off more fluid during HD on 10/3  4. ESRD on HD: -As above -Per Renal  5. Malignant hypertension: -Improved -Continue current medications  6. HLD: -Lipitor  7. Anemia of chronic disease: -Stable  Signed, Eula Listen, PA-C [facility] Pager: [phone] 08/09/2017, 10:21 AM

## 2017-08-09 NOTE — Progress Notes (Signed)
*  PRELIMINARY RESULTS* Echocardiogram 2D Echocardiogram has been performed.  Cristela Blue 08/09/2017, 10:50 AM

## 2017-08-09 NOTE — Care Management (Addendum)
Patient to discharge home today.  PT has assessed patient and No PT follow up recommended.  Patient is followed by [facility].  Bedside RN has already arranged transport home by [facility] at 2:30pm.  [facility] HD liaison notified of discharge.  No RNCM needs identified.

## 2017-08-09 NOTE — Progress Notes (Signed)
Nichole Hurst  A and O x 4. VSS. Pt tolerating diet well. No complaints of pain or nausea. IV removed intact, prescriptions given. Pt voiced understanding of discharge instructions with no further questions. Pt discharged via wheelchair with [facility] employee.     Allergies as of 08/09/2017      Reactions   Colchicine Anaphylaxis   Clopidogrel       Medication List    STOP taking these medications   lisinopril 10 MG tablet Commonly known as:  PRINIVIL,ZESTRIL     TAKE these medications   aspirin EC 81 MG tablet Take 81 mg by mouth at bedtime.   atorvastatin 80 MG tablet Commonly known as:  LIPITOR Take 80 mg by mouth every evening.   bimatoprost 0.03 % ophthalmic solution Commonly known as:  LUMIGAN Place 1 drop into both eyes 2 (two) times daily.   Brinzolamide-Brimonidine 1-0.2 % Susp Apply 1 drop to eye 3 (three) times daily. LEFT EYE   calcium acetate 667 MG capsule Commonly known as:  PHOSLO Take 1,334 mg by mouth 3 (three) times daily with meals.   diclofenac sodium 1 % Gel Commonly known as:  VOLTAREN Apply topically 4 (four) times daily.   DUREZOL 0.05 % Emul Generic drug:  Difluprednate Place 1 drop into the left eye 4 (four) times daily.   Ferrous Sulfate 140 (45 Fe) MG Tbcr Take 1 tablet by mouth daily.   FLONASE NA Place 2 puffs into the nose daily.   Ipratropium-Albuterol 20-100 MCG/ACT Aers respimat Commonly known as:  COMBIVENT Inhale 2 puffs into the lungs every 6 (six) hours.   irbesartan 150 MG tablet Commonly known as:  AVAPRO Take 1 tablet (150 mg total) by mouth at bedtime.   loperamide 2 MG capsule Commonly known as:  IMODIUM Take by mouth as needed for diarrhea or loose stools.   Metoprolol Succinate 25 MG Cs24 Take 25 mg by mouth daily.   moxifloxacin 0.5 % ophthalmic solution Commonly known as:  VIGAMOX Place 1 drop into the left eye 4 (four) times daily.   nitroGLYCERIN 0.4 MG SL tablet Commonly known as:  NITROSTAT Place 0.4  mg under the tongue every 5 (five) minutes as needed for chest pain.   ondansetron 4 MG tablet Commonly known as:  ZOFRAN Take 4 mg by mouth every 8 (eight) hours as needed for nausea or vomiting.   ticagrelor 60 MG Tabs tablet Commonly known as:  BRILINTA Take 60 mg by mouth 2 (two) times daily.   timolol 0.5 % ophthalmic solution Commonly known as:  BETIMOL Place 1 drop into the left eye 2 (two) times daily.   traMADol 50 MG tablet Commonly known as:  ULTRAM Take 1 tablet (50 mg total) by mouth 3 (three) times daily as needed for moderate pain or severe pain.   Vitamin D (Ergocalciferol) 50000 units Caps capsule Commonly known as:  DRISDOL Take 50,000 Units by mouth every 30 (thirty) days.       Vitals:   08/09/17 1343 08/09/17 1400  BP:    Pulse:    Resp:    Temp:    SpO2: 99% 98%    Suzzanne Cloud

## 2017-08-13 LAB — CULTURE, BLOOD (ROUTINE X 2)
CULTURE: NO GROWTH
SPECIAL REQUESTS: ADEQUATE

## 2017-08-17 ENCOUNTER — Ambulatory Visit: Payer: Medicare (Managed Care) | Admitting: Nurse Practitioner

## 2017-10-28 ENCOUNTER — Other Ambulatory Visit: Payer: Self-pay | Admitting: Family Medicine

## 2017-10-28 ENCOUNTER — Ambulatory Visit (INDEPENDENT_AMBULATORY_CARE_PROVIDER_SITE_OTHER): Payer: Medicare (Managed Care) | Admitting: Vascular Surgery

## 2017-10-28 ENCOUNTER — Encounter (INDEPENDENT_AMBULATORY_CARE_PROVIDER_SITE_OTHER): Payer: Self-pay | Admitting: Vascular Surgery

## 2017-10-28 ENCOUNTER — Ambulatory Visit (INDEPENDENT_AMBULATORY_CARE_PROVIDER_SITE_OTHER): Payer: Medicare (Managed Care)

## 2017-10-28 VITALS — BP 123/71 | HR 83 | Resp 16 | Wt 148.0 lb

## 2017-10-28 DIAGNOSIS — I10 Essential (primary) hypertension: Secondary | ICD-10-CM

## 2017-10-28 DIAGNOSIS — E785 Hyperlipidemia, unspecified: Secondary | ICD-10-CM

## 2017-10-28 DIAGNOSIS — R1011 Right upper quadrant pain: Secondary | ICD-10-CM

## 2017-10-28 DIAGNOSIS — E1122 Type 2 diabetes mellitus with diabetic chronic kidney disease: Secondary | ICD-10-CM | POA: Diagnosis not present

## 2017-10-28 DIAGNOSIS — N186 End stage renal disease: Secondary | ICD-10-CM

## 2017-10-28 DIAGNOSIS — I70262 Atherosclerosis of native arteries of extremities with gangrene, left leg: Secondary | ICD-10-CM

## 2017-10-28 NOTE — Patient Instructions (Signed)

## 2017-10-28 NOTE — Progress Notes (Signed)
MRN : [id number]  Nichole Hurst is a 66 y.o. (1951-11-03) female who presents with chief complaint of  Chief Complaint  Patient presents with  . Follow-up    56mh abi  .  History of Present Illness: Patient returns today in follow up of PAD.  The dry gangrenous tip on her left great toe has been decreasing in size.  It remains stable without evidence of infection.  It is currently not hurting her much.  She denies any new changes or problems.  Her ABIs today are normal at about 1.1 bilaterally with pretty good waveforms although her digital waveform on the left was reduced.  Current Outpatient Medications  Medication Sig Dispense Refill  . aspirin EC 81 MG tablet Take 81 mg by mouth at bedtime.    .Marland Kitchenatorvastatin (LIPITOR) 80 MG tablet Take 80 mg by mouth every evening.    . bimatoprost (LUMIGAN) 0.03 % ophthalmic solution Place 1 drop into both eyes 2 (two) times daily.    . Brinzolamide-Brimonidine 1-0.2 % SUSP Apply 1 drop to eye 3 (three) times daily. LEFT EYE    . calcium acetate (PHOSLO) 667 MG capsule Take 1,334 mg by mouth 3 (three) times daily with meals.    . diclofenac sodium (VOLTAREN) 1 % GEL Apply topically 4 (four) times daily.    . Difluprednate (DUREZOL) 0.05 % EMUL Place 1 drop into the left eye 4 (four) times daily.     . Ferrous Sulfate 140 (45 Fe) MG TBCR Take 1 tablet by mouth daily.    . Fluticasone Propionate (FLONASE NA) Place 2 puffs into the nose daily.    . Ipratropium-Albuterol (COMBIVENT) 20-100 MCG/ACT AERS respimat Inhale 2 puffs into the lungs every 6 (six) hours.    . irbesartan (AVAPRO) 150 MG tablet Take 1 tablet (150 mg total) by mouth at bedtime. 30 tablet 1  . loperamide (IMODIUM) 2 MG capsule Take by mouth as needed for diarrhea or loose stools.    . Metoprolol Succinate 25 MG CS24 Take 25 mg by mouth daily.    .Marland Kitchenmoxifloxacin (VIGAMOX) 0.5 % ophthalmic solution Place 1 drop into the left eye 4 (four) times daily.    . nitroGLYCERIN (NITROSTAT) 0.4  MG SL tablet Place 0.4 mg under the tongue every 5 (five) minutes as needed for chest pain.    .Marland Kitchenondansetron (ZOFRAN) 4 MG tablet Take 4 mg by mouth every 8 (eight) hours as needed for nausea or vomiting.    . ticagrelor (BRILINTA) 60 MG TABS tablet Take 60 mg by mouth 2 (two) times daily.     . timolol (BETIMOL) 0.5 % ophthalmic solution Place 1 drop into the left eye 2 (two) times daily.     . traMADol (ULTRAM) 50 MG tablet Take 1 tablet (50 mg total) by mouth 3 (three) times daily as needed for moderate pain or severe pain. 15 tablet 0  . Vitamin D, Ergocalciferol, (DRISDOL) 50000 units CAPS capsule Take 50,000 Units by mouth every 30 (thirty) days.     No current facility-administered medications for this visit.     Past Medical History:  Diagnosis Date  . Anemia of chronic disease   . CAD (coronary artery disease)    a. s/p 2-V CABG 05/2016 (LIMA-LAD, VG-OM1)  . Carotid stenosis   . Charcot's gait    FOOT  . Claustrophobia   . Colon polyp   . [place]   . Emphysema/COPD ([place]   . ESRD (  end stage renal disease) on dialysis ([place])    DIAYLISIS M/W/F  . Fractures    Lt foot  . GERD (gastroesophageal reflux disease)   . Glaucoma   . History of kidney stones   . Hyperlipidemia   . Hypertension   . Migraine   . Orthopnea   . Oxygen deficiency    AS NEEDED  . PAD (peripheral artery disease) ([place])   . Pain    JOINTS  . Presence of permanent cardiac pacemaker    St. Jude   . Retinopathy    DIABETIC  . Stroke [facility]) 09/2015   Ischemic stroke with R sided hemiparesis    Past Surgical History:  Procedure Laterality Date  . ABDOMINAL HYSTERECTOMY    . AV FISTULA PLACEMENT     LEFT ARM  . CATARACT EXTRACTION W/PHACO Right 08/25/2016   Procedure: CATARACT EXTRACTION PHACO AND INTRAOCULAR LENS PLACEMENT (IOC) AND ANTERIOR VITRECTOMY;  Surgeon: Estill Cotta, MD;  Location: [facility] ORS;  Service: Ophthalmology;  Laterality: Right;  FLUID CASSETTE  [id number] H, EXP 01/05/18  . CHOLECYSTECTOMY    . CORONARY ARTERY BYPASS GRAFT     05/13/2016  . INSERT / REPLACE / REMOVE PACEMAKER     6/17  . INSERTION OF AHMED VALVE Left 08/04/2017   Procedure: INSERTION OF AHMED VALVE;  Surgeon: Leandrew Koyanagi, MD;  Location: [facility] ORS;  Service: Ophthalmology;  Laterality: Left;  . LOWER EXTREMITY ANGIOGRAPHY Left 06/13/2017   Procedure: Lower Extremity Angiography;  Surgeon: Algernon Huxley, MD;  Location: [place] CV LAB;  Service: Cardiovascular;  Laterality: Left;  . PACEMAKER IMPLANT    . PERIPHERAL VASCULAR CATHETERIZATION Left 04/08/2016   Procedure: A/V Shuntogram/Fistulagram;  Surgeon: Algernon Huxley, MD;  Location: [place] CV LAB;  Service: Cardiovascular;  Laterality: Left;  . PERIPHERAL VASCULAR CATHETERIZATION N/A 04/08/2016   Procedure: A/V Shunt Intervention;  Surgeon: Algernon Huxley, MD;  Location: [place] CV LAB;  Service: Cardiovascular;  Laterality: N/A;  . STONES     KIDNEY  . TONSILLECTOMY    . TUBAL LIGATION            Family History  Problem Relation Age of Onset  . Diabetes Mother   No bleeding disorders, clotting disorders, or autoimmune dsieases  Social History     Social History  Substance Use Topics  . Smoking status: Former Research scientist (life sciences)  . Smokeless tobacco: Former Systems developer  . Alcohol use No  No IVDU      Allergies  Allergen Reactions  . Colchicine Anaphylaxis    REVIEW OF SYSTEMS(Negative unless checked)  Constitutional: _0 Weight loss_1 Fever_2 Chills Cardiac:_3 Chest pain_4 Chest pressure_5 Palpitations _6 Shortness of breath when laying flat _7 Shortness of breath at rest _8 Shortness of breath with exertion. Vascular: _9 Pain in legs with walking_10 Pain in legsat rest_11 Pain in legs when laying flat _12 Claudication _13 Pain in feet when walking _14 Pain in feet at rest _15 Pain in feet when laying flat _16 History of DVT _17 Phlebitis _18 Swelling in legs _19 Varicose veins  _20 Non-healing ulcers Pulmonary: _21 Uses home oxygen _22 Productive cough_23 Hemoptysis _24 Wheeze _25 COPD _26 Asthma Neurologic: _27 Dizziness _28 Blackouts _29 Seizures _30 History of stroke _31 History of TIA_32 Aphasia _33 Temporary blindness_34 Dysphagia _35 Weaknessor numbness in arms _36 Weakness or numbnessin legs Musculoskeletal: _37 Arthritis _38 Joint swelling _39 Joint pain _40 Low back pain Hematologic:_41 Easy bruising_42 Easy bleeding _43 Hypercoagulable state _44 Anemic _45 Hepatitis Gastrointestinal:_46 Blood in stool_47 Vomiting blood_48 Gastroesophageal reflux/heartburn_49 Abdominal pain Genitourinary: _50 Chronic kidney disease _51 Difficulturination _52 Frequenturination _53 Burning with urination_54 Hematuria Skin: _55 Rashes _56 Ulcers _57 Wounds Psychological: _58 History of anxiety_59 History of major depression.     Physical Examination  BP 123/71 (BP Location: Right Arm)   Pulse 83   Resp 16  Wt 67.1 kg (148 lb)   BMI 27.96 kg/m  Gen:  WD/WN, NAD Head: [place]/AT, No temporalis wasting. Ear/Nose/Throat: Hearing grossly intact, nares w/o erythema or drainage, trachea midline Eyes: Conjunctiva clear. Sclera non-icteric Neck: Supple.  No JVD.  Pulmonary:  Good air movement, no use of accessory muscles.  Cardiac: RRR, normal S1, S2 Vascular: Good thrill in left arm access Vessel Right Left  Radial Palpable Palpable                          PT 1+ Palpable 1+ Palpable  DP 1+ Palpable Palpable    Musculoskeletal: M/S 5/5 throughout.  2-3 cm circular tip of the left great toe with dry gangrene but no significant surrounding erythema or drainage.  Mild left lower extremity swelling.  Uses a wheelchair Neurologic: Sensation grossly intact in extremities.  Symmetrical.  Speech is fluent.  Psychiatric: Judgment and insight appear fair Dermatologic: Gangrenous tip of the left great toe as above    Labs Recent Results (from the past 2160 hour(s))    Glucose, capillary     Status: Abnormal   Collection Time: 08/04/17  9:04 AM  Result Value Ref Range   Glucose-Capillary 197 (H) 65 - 99 mg/dL  I-STAT 4, (NA,K, GLUC, HGB,HCT)     Status: Abnormal   Collection Time: 08/04/17  9:25 AM  Result Value Ref Range   Sodium 140 135 - 145 mmol/L   Potassium 4.0 3.5 - 5.1 mmol/L   Glucose, Bld 206 (H) 65 - 99 mg/dL   HCT 33.0 (L) 36.0 - 46.0 %   Hemoglobin 11.2 (L) 12.0 - 15.0 g/dL  Comprehensive metabolic panel     Status: Abnormal   Collection Time: 08/08/17  6:05 AM  Result Value Ref Range   Sodium 137 135 - 145 mmol/L   Potassium 4.4 3.5 - 5.1 mmol/L   Chloride 95 (L) 101 - 111 mmol/L   CO2 27 22 - 32 mmol/L   Glucose, Bld 292 (H) 65 - 99 mg/dL   BUN 53 (H) 6 - 20 mg/dL   Creatinine, Ser 7.57 (H) 0.44 - 1.00 mg/dL   Calcium 9.5 8.9 - 10.3 mg/dL   Total Protein 7.9 6.5 - 8.1 g/dL   Albumin 4.2 3.5 - 5.0 g/dL   AST 53 (H) 15 - 41 U/L   ALT 8 (L) 14 - 54 U/L   Alkaline Phosphatase 73 38 - 126 U/L   Total Bilirubin 0.7 0.3 - 1.2 mg/dL   GFR calc non Af Amer 5 (L) >60 mL/min   GFR calc Af Amer 6 (L) >60 mL/min    Comment: (NOTE) The eGFR has been calculated using the CKD EPI equation. This calculation has not been validated in all clinical situations. eGFR's persistently <60 mL/min signify possible Chronic Kidney Disease.    Anion gap 15 5 - 15  CBC with Differential     Status: Abnormal   Collection Time: 08/08/17  6:05 AM  Result Value Ref Range   WBC 10.1 3.6 - 11.0 K/uL   RBC 3.29 (L) 3.80 - 5.20 MIL/uL   Hemoglobin 10.1 (L) 12.0 - 16.0 g/dL   HCT 30.3 (L) 35.0 - 47.0 %   MCV 92.0 80.0 - 100.0 fL   MCH 30.6 26.0 - 34.0 pg   MCHC 33.3 32.0 - 36.0 g/dL   RDW 15.6 (H) 11.5 - 14.5 %   Platelets 207 150 - 440 K/uL   Neutrophils Relative %  80 %   Neutro Abs 8.0 (H) 1.4 - 6.5 K/uL   Lymphocytes Relative 12 %   Lymphs Abs 1.2 1.0 - 3.6 K/uL   Monocytes Relative 4 %   Monocytes Absolute 0.4 0.2 - 0.9 K/uL   Eosinophils  Relative 4 %   Eosinophils Absolute 0.4 0 - 0.7 K/uL   Basophils Relative 2 %   Basophils Absolute 0.1 0 - 0.1 K/uL  Protime-INR     Status: None   Collection Time: 08/08/17  6:05 AM  Result Value Ref Range   Prothrombin Time 15.0 11.4 - 15.2 seconds   INR 1.19   Troponin I     Status: Abnormal   Collection Time: 08/08/17  6:05 AM  Result Value Ref Range   Troponin I 1.02 (HH) <0.03 ng/mL    Comment: CRITICAL RESULT CALLED TO, READ BACK BY AND VERIFIED WITH KIM GAULT @ 1324 08/08/17 [facility]   Lactic acid, plasma     Status: None   Collection Time: 08/08/17  6:31 AM  Result Value Ref Range   Lactic Acid, Venous 1.3 0.5 - 1.9 mmol/L  Culture, blood (Routine x 2)     Status: None   Collection Time: 08/08/17  6:32 AM  Result Value Ref Range   Specimen Description BLOOD RIGHT AC    Special Requests      BOTTLES DRAWN AEROBIC AND ANAEROBIC Blood Culture adequate volume   Culture NO GROWTH 5 DAYS    Report Status 08/13/2017 FINAL   Glucose, capillary     Status: Abnormal   Collection Time: 08/08/17  3:05 PM  Result Value Ref Range   Glucose-Capillary 125 (H) 65 - 99 mg/dL  Troponin I (q 6hr x 3)     Status: Abnormal   Collection Time: 08/08/17  3:41 PM  Result Value Ref Range   Troponin I 1.07 (HH) <0.03 ng/mL    Comment: CRITICAL VALUE NOTED. VALUE IS CONSISTENT WITH PREVIOUSLY REPORTED/CALLED VALUE.MSS  MRSA PCR Screening     Status: None   Collection Time: 08/08/17  3:59 PM  Result Value Ref Range   MRSA by PCR NEGATIVE NEGATIVE    Comment:        The GeneXpert MRSA Assay (FDA approved for NASAL specimens only), is one component of a comprehensive MRSA colonization surveillance program. It is not intended to diagnose MRSA infection nor to guide or monitor treatment for MRSA infections.   Glucose, capillary     Status: Abnormal   Collection Time: 08/08/17  6:56 PM  Result Value Ref Range   Glucose-Capillary 180 (H) 65 - 99 mg/dL  Glucose, capillary     Status: Abnormal     Collection Time: 08/08/17  9:39 PM  Result Value Ref Range   Glucose-Capillary 221 (H) 65 - 99 mg/dL   Comment 1 Notify RN   Troponin I (q 6hr x 3)     Status: Abnormal   Collection Time: 08/08/17  9:52 PM  Result Value Ref Range   Troponin I 0.79 (HH) <0.03 ng/mL    Comment: CRITICAL VALUE NOTED. VALUE IS CONSISTENT WITH PREVIOUSLY REPORTED/CALLED VALUE [place]  Troponin I (q 6hr x 3)     Status: Abnormal   Collection Time: 08/09/17  3:54 AM  Result Value Ref Range   Troponin I 0.80 (HH) <0.03 ng/mL    Comment: CRITICAL VALUE NOTED. VALUE IS CONSISTENT WITH PREVIOUSLY REPORTED/CALLED VALUE  SDR   Glucose, capillary     Status: Abnormal   Collection Time:  08/09/17  7:55 AM  Result Value Ref Range   Glucose-Capillary 202 (H) 65 - 99 mg/dL   Comment 1 Notify RN   ECHOCARDIOGRAM COMPLETE     Status: None   Collection Time: 08/09/17 10:50 AM  Result Value Ref Range   Weight 2,257.51 oz   Height 61 in   BP 158/61 mmHg  Glucose, capillary     Status: Abnormal   Collection Time: 08/09/17 11:35 AM  Result Value Ref Range   Glucose-Capillary 147 (H) 65 - 99 mg/dL   Comment 1 Notify RN     Radiology No results found.   Assessment/Plan Hyperlipidemia lipid control important in reducing the progression of atherosclerotic disease. Continue statin therapy   Type 2 diabetes mellitus with end-stage renal disease (HCC) blood glucose control important in reducing the progression of atherosclerotic disease. Also, involved in wound healing. On appropriate medications.   BP (high blood pressure) blood pressure control important in reducing the progression of atherosclerotic disease. On appropriate oral medications.   Atherosclerosis of native arteries of the extremities with gangrene ([place]) Her ABIs today are normal at about 1.1 bilaterally with pretty good waveforms although her digital waveform on the left was reduced. Overall, her perfusion appears stable.  No intervention  currently warranted.  I would allow the toe to demarcate and I think the tip will likely auto amputate at some point in the future.  I will plan to see her back to check her perfusion in 3-4 months as she is somewhat of a high risk patient.    Leotis Pain, MD  10/28/2017 2:16 PM    This note was created with Dragon medical transcription system.  Any errors from dictation are purely unintentional

## 2017-10-28 NOTE — Assessment & Plan Note (Signed)
Her ABIs today are normal at about 1.1 bilaterally with pretty good waveforms although her digital waveform on the left was reduced. Overall, her perfusion appears stable.  No intervention currently warranted.  I would allow the toe to demarcate and I think the tip will likely auto amputate at some point in the future.  I will plan to see her back to check her perfusion in 3-4 months as she is somewhat of a high risk patient.

## 2017-11-02 ENCOUNTER — Other Ambulatory Visit: Payer: Self-pay | Admitting: Internal Medicine

## 2017-11-02 DIAGNOSIS — R1011 Right upper quadrant pain: Secondary | ICD-10-CM

## 2017-11-10 ENCOUNTER — Ambulatory Visit
Admission: RE | Admit: 2017-11-10 | Discharge: 2017-11-10 | Disposition: A | Discharge status: Home / Self Care | Payer: Medicare (Managed Care) | Source: Ambulatory Visit | Attending: Family Medicine | Admitting: Family Medicine

## 2017-11-10 DIAGNOSIS — Z9049 Acquired absence of other specified parts of digestive tract: Secondary | ICD-10-CM | POA: Insufficient documentation

## 2017-11-10 DIAGNOSIS — K7689 Other specified diseases of liver: Secondary | ICD-10-CM | POA: Diagnosis not present

## 2017-11-10 DIAGNOSIS — R1011 Right upper quadrant pain: Secondary | ICD-10-CM | POA: Diagnosis not present

## 2017-11-28 ENCOUNTER — Inpatient Hospital Stay
Admission: AD | Admit: 2017-11-28 | Discharge: 2017-11-30 | DRG: 255 | Disposition: A | Discharge status: Home / Self Care | Payer: No Typology Code available for payment source | Source: Ambulatory Visit | Attending: Internal Medicine | Admitting: Internal Medicine

## 2017-11-28 ENCOUNTER — Inpatient Hospital Stay: Payer: No Typology Code available for payment source

## 2017-11-28 ENCOUNTER — Other Ambulatory Visit: Payer: Self-pay

## 2017-11-28 DIAGNOSIS — Z9841 Cataract extraction status, right eye: Secondary | ICD-10-CM

## 2017-11-28 DIAGNOSIS — H409 Unspecified glaucoma: Secondary | ICD-10-CM | POA: Diagnosis present

## 2017-11-28 DIAGNOSIS — Z992 Dependence on renal dialysis: Secondary | ICD-10-CM | POA: Diagnosis not present

## 2017-11-28 DIAGNOSIS — F40240 Claustrophobia: Secondary | ICD-10-CM | POA: Diagnosis present

## 2017-11-28 DIAGNOSIS — J449 Chronic obstructive pulmonary disease, unspecified: Secondary | ICD-10-CM | POA: Diagnosis present

## 2017-11-28 DIAGNOSIS — Z8673 Personal history of transient ischemic attack (TIA), and cerebral infarction without residual deficits: Secondary | ICD-10-CM

## 2017-11-28 DIAGNOSIS — N186 End stage renal disease: Secondary | ICD-10-CM | POA: Diagnosis present

## 2017-11-28 DIAGNOSIS — I120 Hypertensive chronic kidney disease with stage 5 chronic kidney disease or end stage renal disease: Secondary | ICD-10-CM | POA: Diagnosis present

## 2017-11-28 DIAGNOSIS — Z888 Allergy status to other drugs, medicaments and biological substances status: Secondary | ICD-10-CM

## 2017-11-28 DIAGNOSIS — E785 Hyperlipidemia, unspecified: Secondary | ICD-10-CM | POA: Diagnosis present

## 2017-11-28 DIAGNOSIS — E1152 Type 2 diabetes mellitus with diabetic peripheral angiopathy with gangrene: Principal | ICD-10-CM | POA: Diagnosis present

## 2017-11-28 DIAGNOSIS — E1122 Type 2 diabetes mellitus with diabetic chronic kidney disease: Secondary | ICD-10-CM | POA: Diagnosis present

## 2017-11-28 DIAGNOSIS — Z86010 Personal history of colonic polyps: Secondary | ICD-10-CM | POA: Diagnosis not present

## 2017-11-28 DIAGNOSIS — Z9851 Tubal ligation status: Secondary | ICD-10-CM

## 2017-11-28 DIAGNOSIS — G8191 Hemiplegia, unspecified affecting right dominant side: Secondary | ICD-10-CM | POA: Diagnosis present

## 2017-11-28 DIAGNOSIS — Z87891 Personal history of nicotine dependence: Secondary | ICD-10-CM

## 2017-11-28 DIAGNOSIS — E11319 Type 2 diabetes mellitus with unspecified diabetic retinopathy without macular edema: Secondary | ICD-10-CM | POA: Diagnosis present

## 2017-11-28 DIAGNOSIS — Z9089 Acquired absence of other organs: Secondary | ICD-10-CM

## 2017-11-28 DIAGNOSIS — Z794 Long term (current) use of insulin: Secondary | ICD-10-CM | POA: Diagnosis not present

## 2017-11-28 DIAGNOSIS — Z87442 Personal history of urinary calculi: Secondary | ICD-10-CM | POA: Diagnosis not present

## 2017-11-28 DIAGNOSIS — Z961 Presence of intraocular lens: Secondary | ICD-10-CM | POA: Diagnosis present

## 2017-11-28 DIAGNOSIS — I96 Gangrene, not elsewhere classified: Secondary | ICD-10-CM | POA: Diagnosis present

## 2017-11-28 DIAGNOSIS — E1169 Type 2 diabetes mellitus with other specified complication: Secondary | ICD-10-CM | POA: Diagnosis present

## 2017-11-28 DIAGNOSIS — E1140 Type 2 diabetes mellitus with diabetic neuropathy, unspecified: Secondary | ICD-10-CM | POA: Diagnosis present

## 2017-11-28 DIAGNOSIS — Z951 Presence of aortocoronary bypass graft: Secondary | ICD-10-CM

## 2017-11-28 DIAGNOSIS — Z7982 Long term (current) use of aspirin: Secondary | ICD-10-CM

## 2017-11-28 DIAGNOSIS — Z90710 Acquired absence of both cervix and uterus: Secondary | ICD-10-CM

## 2017-11-28 DIAGNOSIS — Z833 Family history of diabetes mellitus: Secondary | ICD-10-CM

## 2017-11-28 DIAGNOSIS — Z8249 Family history of ischemic heart disease and other diseases of the circulatory system: Secondary | ICD-10-CM

## 2017-11-28 DIAGNOSIS — D631 Anemia in chronic kidney disease: Secondary | ICD-10-CM | POA: Diagnosis present

## 2017-11-28 DIAGNOSIS — Z950 Presence of cardiac pacemaker: Secondary | ICD-10-CM

## 2017-11-28 DIAGNOSIS — I2510 Atherosclerotic heart disease of native coronary artery without angina pectoris: Secondary | ICD-10-CM | POA: Diagnosis present

## 2017-11-28 DIAGNOSIS — N2581 Secondary hyperparathyroidism of renal origin: Secondary | ICD-10-CM | POA: Diagnosis present

## 2017-11-28 DIAGNOSIS — M86172 Other acute osteomyelitis, left ankle and foot: Secondary | ICD-10-CM | POA: Diagnosis present

## 2017-11-28 DIAGNOSIS — K219 Gastro-esophageal reflux disease without esophagitis: Secondary | ICD-10-CM | POA: Diagnosis present

## 2017-11-28 LAB — CBC
HEMATOCRIT: 34 % — AB (ref 35.0–47.0)
HEMOGLOBIN: 11 g/dL — AB (ref 12.0–16.0)
MCH: 28.6 pg (ref 26.0–34.0)
MCHC: 32.4 g/dL (ref 32.0–36.0)
MCV: 88.1 fL (ref 80.0–100.0)
Platelets: 310 10*3/uL (ref 150–440)
RBC: 3.86 MIL/uL (ref 3.80–5.20)
RDW: 21.2 % — ABNORMAL HIGH (ref 11.5–14.5)
WBC: 7.4 10*3/uL (ref 3.6–11.0)

## 2017-11-28 LAB — SURGICAL PCR SCREEN
MRSA, PCR: NEGATIVE
Staphylococcus aureus: NEGATIVE

## 2017-11-28 LAB — CREATININE, SERUM
Creatinine, Ser: 4.2 mg/dL — ABNORMAL HIGH (ref 0.44–1.00)
GFR calc Af Amer: 12 mL/min — ABNORMAL LOW
GFR calc non Af Amer: 10 mL/min — ABNORMAL LOW

## 2017-11-28 LAB — GLUCOSE, CAPILLARY
GLUCOSE-CAPILLARY: 170 mg/dL — AB (ref 65–99)
GLUCOSE-CAPILLARY: 217 mg/dL — AB (ref 65–99)

## 2017-11-28 MED ORDER — HEPARIN SODIUM (PORCINE) 5000 UNIT/ML IJ SOLN
5000.0000 [IU] | Freq: Three times a day (TID) | INTRAMUSCULAR | Status: DC
  Administered 2017-11-29 – 2017-11-30 (×2): 5000 [IU] via SUBCUTANEOUS
  Filled 2017-11-28 (×2): qty 1

## 2017-11-28 MED ORDER — VANCOMYCIN HCL 10 G IV SOLR
1500.0000 mg | Freq: Once | INTRAVENOUS | Status: AC
  Administered 2017-11-28: 1500 mg via INTRAVENOUS
  Filled 2017-11-28: qty 1500

## 2017-11-28 MED ORDER — DIFLUPREDNATE 0.05 % OP EMUL
1.0000 [drp] | Freq: Every day | OPHTHALMIC | Status: DC
  Administered 2017-11-30: 1 [drp] via OPHTHALMIC
  Filled 2017-11-28: qty 5

## 2017-11-28 MED ORDER — ATORVASTATIN CALCIUM 20 MG PO TABS
80.0000 mg | ORAL_TABLET | Freq: Every evening | ORAL | Status: DC
  Administered 2017-11-28 – 2017-11-29 (×2): 80 mg via ORAL
  Filled 2017-11-28 (×2): qty 4

## 2017-11-28 MED ORDER — ASPIRIN EC 81 MG PO TBEC
81.0000 mg | DELAYED_RELEASE_TABLET | Freq: Every day | ORAL | Status: DC
  Administered 2017-11-29: 81 mg via ORAL
  Filled 2017-11-28 (×2): qty 1

## 2017-11-28 MED ORDER — METOPROLOL SUCCINATE ER 25 MG PO TB24
25.0000 mg | ORAL_TABLET | Freq: Every day | ORAL | Status: DC
  Administered 2017-11-28 – 2017-11-29 (×2): 25 mg via ORAL
  Filled 2017-11-28 (×2): qty 1

## 2017-11-28 MED ORDER — TIMOLOL MALEATE 0.5 % OP SOLN
1.0000 [drp] | Freq: Two times a day (BID) | OPHTHALMIC | Status: DC
  Filled 2017-11-28: qty 5

## 2017-11-28 MED ORDER — IPRATROPIUM-ALBUTEROL 0.5-2.5 (3) MG/3ML IN SOLN
3.0000 mL | Freq: Four times a day (QID) | RESPIRATORY_TRACT | Status: DC
  Filled 2017-11-28 (×2): qty 3

## 2017-11-28 MED ORDER — IPRATROPIUM-ALBUTEROL 0.5-2.5 (3) MG/3ML IN SOLN: 3.0000 mL | Freq: Four times a day (QID) | RESPIRATORY_TRACT | Status: DC | PRN

## 2017-11-28 MED ORDER — CALCIUM ACETATE (PHOS BINDER) 667 MG PO CAPS
1334.0000 mg | ORAL_CAPSULE | Freq: Three times a day (TID) | ORAL | Status: DC
  Administered 2017-11-29 – 2017-11-30 (×2): 1334 mg via ORAL
  Filled 2017-11-28 (×6): qty 2

## 2017-11-28 MED ORDER — ONDANSETRON HCL 4 MG PO TABS: 4.0000 mg | ORAL_TABLET | Freq: Three times a day (TID) | ORAL | Status: DC | PRN

## 2017-11-28 MED ORDER — METOPROLOL SUCCINATE ER 25 MG PO TB24: 25.0000 mg | ORAL_TABLET | Freq: Every evening | ORAL | Status: DC

## 2017-11-28 MED ORDER — LOSARTAN POTASSIUM 50 MG PO TABS: 100.0000 mg | ORAL_TABLET | Freq: Every day | ORAL | Status: DC

## 2017-11-28 MED ORDER — MOXIFLOXACIN HCL 0.5 % OP SOLN
1.0000 [drp] | Freq: Four times a day (QID) | OPHTHALMIC | Status: DC
  Filled 2017-11-28: qty 3

## 2017-11-28 MED ORDER — VANCOMYCIN HCL IN DEXTROSE 750-5 MG/150ML-% IV SOLN: 750.0000 mg | INTRAVENOUS | Status: DC

## 2017-11-28 MED ORDER — NITROGLYCERIN 0.4 MG SL SUBL: 0.4000 mg | SUBLINGUAL_TABLET | SUBLINGUAL | Status: DC | PRN

## 2017-11-28 MED ORDER — FLUTICASONE PROPIONATE 50 MCG/ACT NA SUSP
1.0000 | Freq: Every day | NASAL | Status: DC
  Filled 2017-11-28: qty 16

## 2017-11-28 MED ORDER — IRBESARTAN 150 MG PO TABS
150.0000 mg | ORAL_TABLET | Freq: Every day | ORAL | Status: DC
  Administered 2017-11-29: 150 mg via ORAL
  Filled 2017-11-28 (×2): qty 1

## 2017-11-28 MED ORDER — PIPERACILLIN-TAZOBACTAM 3.375 G IVPB
3.3750 g | Freq: Two times a day (BID) | INTRAVENOUS | Status: DC
  Administered 2017-11-28 – 2017-11-29 (×3): 3.375 g via INTRAVENOUS
  Filled 2017-11-28 (×3): qty 50

## 2017-11-28 MED ORDER — DOCUSATE SODIUM 100 MG PO CAPS: 100.0000 mg | ORAL_CAPSULE | Freq: Two times a day (BID) | ORAL | Status: DC | PRN

## 2017-11-28 MED ORDER — TICAGRELOR 60 MG PO TABS
60.0000 mg | ORAL_TABLET | Freq: Two times a day (BID) | ORAL | Status: DC
  Administered 2017-11-29 – 2017-11-30 (×2): 60 mg via ORAL
  Filled 2017-11-28 (×5): qty 1

## 2017-11-28 MED ORDER — INSULIN ASPART 100 UNIT/ML ~~LOC~~ SOLN
0.0000 [IU] | Freq: Three times a day (TID) | SUBCUTANEOUS | Status: DC
  Administered 2017-11-29: 1 [IU] via SUBCUTANEOUS
  Administered 2017-11-29 – 2017-11-30 (×2): 2 [IU] via SUBCUTANEOUS
  Administered 2017-11-30: 1 [IU] via SUBCUTANEOUS
  Administered 2017-11-30: 2 [IU] via SUBCUTANEOUS
  Filled 2017-11-28 (×5): qty 1

## 2017-11-28 MED ORDER — LATANOPROST 0.005 % OP SOLN
1.0000 [drp] | Freq: Every day | OPHTHALMIC | Status: DC
  Filled 2017-11-28: qty 2.5

## 2017-11-28 MED ORDER — TRAMADOL HCL 50 MG PO TABS
50.0000 mg | ORAL_TABLET | Freq: Three times a day (TID) | ORAL | Status: DC | PRN
  Filled 2017-11-28: qty 1

## 2017-11-28 NOTE — H&P (Signed)
[employer] - [place] at [facility]   PATIENT NAME: Nichole Hurst    MR#:  [id number]  DATE OF BIRTH:  June 10, 1951  DATE OF ADMISSION:  11/28/2017  PRIMARY CARE PHYSICIAN: System, Pcp Not In   REQUESTING/REFERRING PHYSICIAN: Alberteen Spindle  CHIEF COMPLAINT:  No chief complaint on file.   HISTORY OF PRESENT ILLNESS: Nichole Hurst  is a 67 y.o. female with a known history of coronary artery disease status post CABG, carotid stenosis, Charcoat's disease, diabetes, end-stage renal disease on hemodialysis, hypertension, hyperlipidemia- had small gangrenous area at the tip of her left first toe since last few months which was dry gangrene and she was following with podiatry in the office. But now it started hurting more and today during the visit podiatrist noted it is wet gangrene now. As per him it needed urgent attention and amputation tomorrow so he suggested to get direct admitted to the hospital and called in. Patient denies any fever or chills. She has noted more swelling on her foot for last few days.  PAST MEDICAL HISTORY:   Past Medical History:  Diagnosis Date  . Anemia of chronic disease   . CAD (coronary artery disease)    a. s/p 2-V CABG 05/2016 (LIMA-LAD, VG-OM1)  . Carotid stenosis   . Charcot's gait    FOOT  . Claustrophobia   . Colon polyp   . Diabetes mellitus with complication (HCC)   . Emphysema/COPD (HCC)   . ESRD (end stage renal disease) on dialysis (HCC)    DIAYLISIS M/W/F  . Fractures    Lt foot  . GERD (gastroesophageal reflux disease)   . Glaucoma   . History of kidney stones   . Hyperlipidemia   . Hypertension   . Migraine   . Orthopnea   . Oxygen deficiency    AS NEEDED  . PAD (peripheral artery disease) (HCC)   . Pain    JOINTS  . Presence of permanent cardiac pacemaker    St. Jude   . Retinopathy    DIABETIC  . Stroke [facility]) 09/2015   Ischemic stroke with R sided hemiparesis    PAST SURGICAL HISTORY:  Past Surgical History:   Procedure Laterality Date  . ABDOMINAL HYSTERECTOMY    . AV FISTULA PLACEMENT     LEFT ARM  . CATARACT EXTRACTION W/PHACO Right 08/25/2016   Procedure: CATARACT EXTRACTION PHACO AND INTRAOCULAR LENS PLACEMENT (IOC) AND ANTERIOR VITRECTOMY;  Surgeon: Sallee Lange, MD;  Location: [facility] ORS;  Service: Ophthalmology;  Laterality: Right;  FLUID CASSETTE [id number] H, EXP 01/05/18  . CHOLECYSTECTOMY    . CORONARY ARTERY BYPASS GRAFT     05/13/2016  . INSERT / REPLACE / REMOVE PACEMAKER     6/17  . INSERTION OF AHMED VALVE Left 08/04/2017   Procedure: INSERTION OF AHMED VALVE;  Surgeon: Lockie Mola, MD;  Location: [facility] ORS;  Service: Ophthalmology;  Laterality: Left;  . LOWER EXTREMITY ANGIOGRAPHY Left 06/13/2017   Procedure: Lower Extremity Angiography;  Surgeon: Annice Needy, MD;  Location: [facility] INVASIVE CV LAB;  Service: Cardiovascular;  Laterality: Left;  . PACEMAKER IMPLANT    . PERIPHERAL VASCULAR CATHETERIZATION Left 04/08/2016   Procedure: A/V Shuntogram/Fistulagram;  Surgeon: Annice Needy, MD;  Location: [facility] INVASIVE CV LAB;  Service: Cardiovascular;  Laterality: Left;  . PERIPHERAL VASCULAR CATHETERIZATION N/A 04/08/2016   Procedure: A/V Shunt Intervention;  Surgeon: Annice Needy, MD;  Location: [facility] INVASIVE CV LAB;  Service: Cardiovascular;  Laterality: N/A;  . STONES  KIDNEY  . TONSILLECTOMY    . TUBAL LIGATION      SOCIAL HISTORY:  Social History   Tobacco Use  . Smoking status: Former Smoker    Last attempt to quit: 06/10/1983    Years since quitting: 34.4  . Smokeless tobacco: Former Engineer, water Use Topics  . Alcohol use: No    FAMILY HISTORY:  Family History  Problem Relation Age of Onset  . Diabetes Mother   . Hypertension Mother   . Heart attack Mother     DRUG ALLERGIES:  Allergies  Allergen Reactions  . Colchicine Anaphylaxis  . Clopidogrel     REVIEW OF SYSTEMS:   CONSTITUTIONAL: No fever, fatigue or weakness.  EYES: No blurred or double  vision.  EARS, NOSE, AND THROAT: No tinnitus or ear pain.  RESPIRATORY: No cough, shortness of breath, wheezing or hemoptysis.  CARDIOVASCULAR: No chest pain, orthopnea, edema.  GASTROINTESTINAL: No nausea, vomiting, diarrhea or abdominal pain.  GENITOURINARY: No dysuria, hematuria.  ENDOCRINE: No polyuria, nocturia,  HEMATOLOGY: No anemia, easy bruising or bleeding SKIN: No rash or lesion. MUSCULOSKELETAL: No joint pain or arthritis.   NEUROLOGIC: No tingling, numbness, weakness.  PSYCHIATRY: No anxiety or depression.   MEDICATIONS AT HOME:  Prior to Admission medications   Medication Sig Start Date End Date Taking? Authorizing Provider  amoxicillin-clavulanate (AUGMENTIN) 500-125 MG tablet Take 1 tablet by mouth 3 (three) times daily. Take 1 tablet by mouth once a day and administer additional dose by mouth during and at end of dialysis   Yes [provider]  aspirin EC 81 MG tablet Take 81 mg by mouth at bedtime.   Yes [provider]  atorvastatin (LIPITOR) 80 MG tablet Take 80 mg by mouth every evening.   Yes [provider]  calcium acetate (PHOSLO) 667 MG capsule Take 1,334 mg by mouth 3 (three) times daily with meals.   Yes [provider]  Difluprednate (DUREZOL) 0.05 % EMUL Place 1 drop into the left eye daily.    Yes [provider]  Ferrous Sulfate 140 (45 Fe) MG TBCR Take 1 tablet by mouth daily.   Yes [provider]  loperamide (IMODIUM) 2 MG capsule Take by mouth as needed for diarrhea or loose stools.   Yes [provider]  losartan (COZAAR) 100 MG tablet Take 100 mg by mouth at bedtime.   Yes [provider]  metoprolol succinate (TOPROL-XL) 25 MG 24 hr tablet Take 25 mg by mouth every evening.   Yes [provider]  ondansetron (ZOFRAN) 4 MG tablet Take 4 mg by mouth every 8 (eight) hours as needed for nausea or vomiting.   Yes [provider]  ticagrelor (BRILINTA) 60 MG TABS tablet  Take 60 mg by mouth 2 (two) times daily.    Yes [provider]  bimatoprost (LUMIGAN) 0.03 % ophthalmic solution Place 1 drop into the right eye at bedtime.     [provider]  Brinzolamide-Brimonidine 1-0.2 % SUSP Apply 1 drop to eye 3 (three) times daily. LEFT EYE    [provider]  diclofenac sodium (VOLTAREN) 1 % GEL Apply topically 4 (four) times daily.    [provider]  Fluticasone Propionate (FLONASE NA) Place 2 puffs into the nose 2 (two) times daily as needed.     [provider]  Ipratropium-Albuterol (COMBIVENT) 20-100 MCG/ACT AERS respimat Inhale 2 puffs into the lungs every 6 (six) hours.    [provider]  irbesartan (  AVAPRO) 150 MG tablet Take 1 tablet (150 mg total) by mouth at bedtime. 08/09/17   Enedina FinnerPatel, Sona, MD  Metoprolol Succinate 25 MG CS24 Take 25 mg by mouth daily.    [provider]  moxifloxacin (VIGAMOX) 0.5 % ophthalmic solution Place 1 drop into the left eye 4 (four) times daily.    [provider]  nitroGLYCERIN (NITROSTAT) 0.4 MG SL tablet Place 0.4 mg under the tongue every 5 (five) minutes as needed for chest pain.    [provider]  timolol (BETIMOL) 0.5 % ophthalmic solution Place 1 drop into the left eye 2 (two) times daily.     [provider]  traMADol (ULTRAM) 50 MG tablet Take 1 tablet (50 mg total) by mouth 3 (three) times daily as needed for moderate pain or severe pain. 08/09/17   Enedina FinnerPatel, Sona, MD  Vitamin D, Ergocalciferol, (DRISDOL) 50000 units CAPS capsule Take 50,000 Units by mouth every 30 (thirty) days.    [provider]      PHYSICAL EXAMINATION:   VITAL SIGNS: Blood pressure (!) 152/69, pulse (!) 105, temperature 98.6 F (37 C), temperature source Oral, SpO2 96 %.  GENERAL:  67 y.o.-year-old patient lying in the bed with no acute distress.  EYES: Pupils equal, round, reactive to light and accommodation. No scleral icterus. Extraocular muscles  intact.  HEENT: Head atraumatic, normocephalic. Oropharynx and nasopharynx clear.  NECK:  Supple, no jugular venous distention. No thyroid enlargement, no tenderness.  LUNGS: Normal breath sounds bilaterally, no wheezing, rales,rhonchi or crepitation. No use of accessory muscles of respiration.  CARDIOVASCULAR: S1, S2 normal. No murmurs, rubs, or gallops.  ABDOMEN: Soft, nontender, nondistended. Bowel sounds present. No organomegaly or mass.  EXTREMITIES: Left foot is swollen and on her distal half of the great toe on left there is black discoloration with some surrounding redness and some serous secretions from there.  NEUROLOGIC: Cranial nerves II through XII are intact. Muscle strength 5/5 in all extremities. Sensation intact. Gait not checked.  PSYCHIATRIC: The patient is alert and oriented x 3.  SKIN: No obvious rash, lesion, or ulcer.   LABORATORY PANEL:   CBC No results for input(s): WBC, HGB, HCT, PLT, MCV, MCH, MCHC, RDW, LYMPHSABS, MONOABS, EOSABS, BASOSABS, BANDABS in the last 168 hours.  Invalid input(s): NEUTRABS, BANDSABD ------------------------------------------------------------------------------------------------------------------  Chemistries  No results for input(s): NA, K, CL, CO2, GLUCOSE, BUN, CREATININE, CALCIUM, MG, AST, ALT, ALKPHOS, BILITOT in the last 168 hours.  Invalid input(s): GFRCGP ------------------------------------------------------------------------------------------------------------------ CrCl cannot be calculated (Patient's most recent lab result is older than the maximum 21 days allowed.). ------------------------------------------------------------------------------------------------------------------ No results for input(s): TSH, T4TOTAL, T3FREE, THYROIDAB in the last 72 hours.  Invalid input(s): FREET3   Coagulation profile No results for input(s): INR, PROTIME in the last 168  hours. ------------------------------------------------------------------------------------------------------------------- No results for input(s): DDIMER in the last 72 hours. -------------------------------------------------------------------------------------------------------------------  Cardiac Enzymes No results for input(s): CKMB, TROPONINI, MYOGLOBIN in the last 168 hours.  Invalid input(s): CK ------------------------------------------------------------------------------------------------------------------ Invalid input(s): POCBNP  ---------------------------------------------------------------------------------------------------------------  Urinalysis No results found for: COLORURINE, APPEARANCEUR, LABSPEC, PHURINE, GLUCOSEU, HGBUR, BILIRUBINUR, KETONESUR, PROTEINUR, UROBILINOGEN, NITRITE, LEUKOCYTESUR   RADIOLOGY: No results found.  EKG: Orders placed or performed during the hospital encounter of 08/08/17  . ED EKG  . ED EKG  . EKG 12-Lead  . EKG 12-Lead    IMPRESSION AND PLAN:  * Wet gangrene   Possible Osteomyelitis of the toe   IV antibiotics for now and podiatry to do amputation tomorrow.   Get x-ray  on foot to check the extent of infection.  * Diabetes   Will keep on insulin sliding scale coverage.    * End-stage renal disease on hemodialysis   Nephrology consult to continue dialysis while in the hospital.  * Hypertension   Continue home medication.  * History of coronary artery disease     Continue home medication and antiplatelet therapy.  * Hyperlipidemia   Continue statin.  * COPD   Currently no acute exacerbation, continue home inhalers.   All the records are reviewed and case discussed with ED provider. Management plans discussed with the patient, family and they are in agreement.  CODE STATUS:Full code.  Code Status History    Date Active Date Inactive Code Status Order ID Comments User Context   08/08/2017 10:36 08/09/2017 18:55  Full Code [id number]  Enedina Finner, MD Inpatient   06/13/2017 11:00 06/13/2017 16:02 Full Code [id number]  Annice Needy, MD Inpatient     Discussed with her sister in the room.  TOTAL TIME TAKING CARE OF THIS PATIENT: 50 minutes.    Altamese Dilling M.D on 11/28/2017   Between 7am to 6pm - Pager - [phone]  After 6pm go to [email or web address] - password [email or web address]  [facility]  Office  [phone]  CC: Primary care physician; System, Pcp Not In   Note: This dictation was prepared with Dragon dictation along with smaller phrase technology. Any transcriptional errors that result from this process are unintentional.

## 2017-11-28 NOTE — H&P (View-Only) (Signed)
Reason for Consult: Gangrene left great toe Referring Physician: Merlin Hurst is an 67 y.o. female.  HPI: This is a 67 year old diabetic female with neuropathy and peripheral vascular disease with a several month history of dry gangrenous changes on her left great toe.  Recently was noted to have this converted to a wet gangrene with exposed bone and osteomyelitis and joint involvement in the great toe.  Decision was made for admission to the hospitalization to proceed with amputation of the great toe.  Past Medical History:  Diagnosis Date  . Anemia of chronic disease   . CAD (coronary artery disease)    a. s/p 2-V CABG 05/2016 (LIMA-LAD, VG-OM1)  . Carotid stenosis   . Charcot's gait    FOOT  . Claustrophobia   . Colon polyp   . Diabetes mellitus with complication ([place])   . Emphysema/COPD ([place])   . ESRD (end stage renal disease) on dialysis ([place])    DIAYLISIS M/W/F  . Fractures    Lt foot  . GERD (gastroesophageal reflux disease)   . Glaucoma   . History of kidney stones   . Hyperlipidemia   . Hypertension   . Migraine   . Orthopnea   . Oxygen deficiency    AS NEEDED  . PAD (peripheral artery disease) ([place])   . Pain    JOINTS  . Presence of permanent cardiac pacemaker    St. Jude   . Retinopathy    DIABETIC  . Stroke [facility]) 09/2015   Ischemic stroke with R sided hemiparesis    Past Surgical History:  Procedure Laterality Date  . ABDOMINAL HYSTERECTOMY    . AV FISTULA PLACEMENT     LEFT ARM  . CATARACT EXTRACTION W/PHACO Right 08/25/2016   Procedure: CATARACT EXTRACTION PHACO AND INTRAOCULAR LENS PLACEMENT (IOC) AND ANTERIOR VITRECTOMY;  Surgeon: Estill Cotta, MD;  Location: [facility] ORS;  Service: Ophthalmology;  Laterality: Right;  FLUID CASSETTE [id number] H, EXP 01/05/18  . CHOLECYSTECTOMY    . CORONARY ARTERY BYPASS GRAFT     05/13/2016  . INSERT / REPLACE / REMOVE PACEMAKER     6/17  . INSERTION OF AHMED VALVE Left 08/04/2017   Procedure: INSERTION OF  AHMED VALVE;  Surgeon: Leandrew Koyanagi, MD;  Location: [facility] ORS;  Service: Ophthalmology;  Laterality: Left;  . LOWER EXTREMITY ANGIOGRAPHY Left 06/13/2017   Procedure: Lower Extremity Angiography;  Surgeon: Algernon Huxley, MD;  Location: [place] CV LAB;  Service: Cardiovascular;  Laterality: Left;  . PACEMAKER IMPLANT    . PERIPHERAL VASCULAR CATHETERIZATION Left 04/08/2016   Procedure: A/V Shuntogram/Fistulagram;  Surgeon: Algernon Huxley, MD;  Location: [place] CV LAB;  Service: Cardiovascular;  Laterality: Left;  . PERIPHERAL VASCULAR CATHETERIZATION N/A 04/08/2016   Procedure: A/V Shunt Intervention;  Surgeon: Algernon Huxley, MD;  Location: [place] CV LAB;  Service: Cardiovascular;  Laterality: N/A;  . STONES     KIDNEY  . TONSILLECTOMY    . TUBAL LIGATION      Family History  Problem Relation Age of Onset  . Diabetes Mother   . Hypertension Mother   . Heart attack Mother     Social History:  reports that she quit smoking about 34 years ago. She has quit using smokeless tobacco. She reports that she does not drink alcohol or use drugs.  Allergies:  Allergies  Allergen Reactions  . Colchicine Anaphylaxis  . Clopidogrel     Medications:  Scheduled: . aspirin EC  81 mg Oral QHS  .  atorvastatin  80 mg Oral QPM  . [START ON 11/29/2017] calcium acetate  1,334 mg Oral TID WC  . Difluprednate  1 drop Left Eye Daily  . fluticasone  1 spray Each Nare Daily  . heparin  5,000 Units Subcutaneous Q8H  . [START ON 11/29/2017] insulin aspart  0-9 Units Subcutaneous TID WC  . ipratropium-albuterol  3 mL Inhalation Q6H  . irbesartan  150 mg Oral QHS  . latanoprost  1 drop Both Eyes QHS  . metoprolol succinate  25 mg Oral Daily  . moxifloxacin  1 drop Left Eye QID  . ticagrelor  60 mg Oral BID  . timolol  1 drop Left Eye BID    Results for orders placed or performed during the hospital encounter of 11/28/17 (from the past 48 hour(s))  Glucose, capillary     Status: Abnormal    Collection Time: 11/28/17  4:52 PM  Result Value Ref Range   Glucose-Capillary 170 (H) 65 - 99 mg/dL  Surgical PCR screen     Status: None   Collection Time: 11/28/17  5:37 PM  Result Value Ref Range   MRSA, PCR NEGATIVE NEGATIVE   Staphylococcus aureus NEGATIVE NEGATIVE    Comment: (NOTE) The Xpert SA Assay (FDA approved for NASAL specimens in patients 55 years of age and older), is one component of a comprehensive surveillance program. It is not intended to diagnose infection nor to guide or monitor treatment. Performed at [facility], [address] [place], [place] [address]   CBC     Status: Abnormal   Collection Time: 11/28/17  6:17 PM  Result Value Ref Range   WBC 7.4 3.6 - 11.0 K/uL   RBC 3.86 3.80 - 5.20 MIL/uL   Hemoglobin 11.0 (L) 12.0 - 16.0 g/dL   HCT 34.0 (L) 35.0 - 47.0 %   MCV 88.1 80.0 - 100.0 fL   MCH 28.6 26.0 - 34.0 pg   MCHC 32.4 32.0 - 36.0 g/dL   RDW 21.2 (H) 11.5 - 14.5 %   Platelets 310 150 - 440 K/uL    Comment: Performed at [facility], [address] [place], [place] [address]  Creatinine, serum     Status: Abnormal   Collection Time: 11/28/17  6:17 PM  Result Value Ref Range   Creatinine, Ser 4.20 (H) 0.44 - 1.00 mg/dL   GFR calc non Af Amer 10 (L) >60 mL/min   GFR calc Af Amer 12 (L) >60 mL/min    Comment: (NOTE) The eGFR has been calculated using the CKD EPI equation. This calculation has not been validated in all clinical situations. eGFR's persistently <60 mL/min signify possible Chronic Kidney Disease. Performed at [facility], [address] [place], [place] [address]   Glucose, capillary     Status: Abnormal   Collection Time: 11/28/17  9:10 PM  Result Value Ref Range   Glucose-Capillary 217 (H) 65 - 99 mg/dL    Dg Foot 2 Views Left  Result Date: 11/28/2017 CLINICAL DATA:  LEFT foot pain.  Gangrene. EXAM: LEFT FOOT - 2 VIEW COMPARISON:  None. FINDINGS: Lucency dorsum of the first distal  phalanx, limited assessment on AP view due to patient positioning. Splayed first and second metatarsal with corticated bony fragment interposed contiguous with midfoot. A second bony fragment projects within the medial midfoot without donor site. Osteopenia. Severe vascular calcifications. Dorsal and forefoot soft tissue swelling without subcutaneous gas or radiopaque foreign bodies. IMPRESSION: Focal lucency dorsum first distal phalanx  most consistent with focal osteomyelitis. 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 Electronically Signed   By: Elon Alas M.D.   On: 11/28/2017 19:33    Review of Systems  Constitutional: Negative for chills and weight loss.  HENT: Negative.   Eyes: Negative.   Respiratory: Negative.   Cardiovascular: Negative.   Gastrointestinal: Negative for nausea and vomiting.  Genitourinary: Negative.   Musculoskeletal:       Relates some pain in her left midfoot area.  Skin:       Recent drainage on her left great toe from some chronic gangrenous changes.  Neurological:       Chronic numbness and neuropathy related to her diabetes.  Endo/Heme/Allergies: Negative.   Psychiatric/Behavioral: Negative.    Blood pressure (!) 117/59, pulse 79, temperature 98.2 F (36.8 C), temperature source Oral, resp. rate 16, height 5' 1"  (1.549 m), weight 64.9 kg (143 lb), SpO2 96 %. Physical Exam  Cardiovascular:  DP and PT pulses are trace and thready on the left foot, significantly diminished on the right foot.  Musculoskeletal:  Chronic Charcot joint changes in the left foot.  Skin:  The skin is thin dry and atrophic with absent hair growth.  Some chronic gangrenous changes at the distal aspect of the left hallux with some exposed bone and joint at the hallux IPJ.    Assessment/Plan: Assessment: 1.  Diabetes with associated neuropathy. 2.  Peripheral vascular disease. 3.  Gangrene with  osteomyelitis left great toe.  Plan: Discussed with the patient the need for amputation for the left great toe.  We discussed possible risks and complications of the amputation including inability to heal due to her diabetes, peripheral vascular disease, or infection.  No guarantees could be given as to the outcome.  Questions were invited and answered.  Patient elects to proceed with surgery for amputation of the left great toe tomorrow.  Obtain consent for amputation left great toe.  N.p.o. after midnight.  Plan for surgery early tomorrow afternoon.  Nichole Hurst 11/28/2017, 10:16 PM

## 2017-11-28 NOTE — Consult Note (Signed)
Reason for Consult: Gangrene left great toe Referring Physician: Alanny Rivers is an 67 y.o. female.  HPI: This is a 67 year old diabetic female with neuropathy and peripheral vascular disease with a several month history of dry gangrenous changes on her left great toe.  Recently was noted to have this converted to a wet gangrene with exposed bone and osteomyelitis and joint involvement in the great toe.  Decision was made for admission to the hospitalization to proceed with amputation of the great toe.  Past Medical History:  Diagnosis Date  . Anemia of chronic disease   . CAD (coronary artery disease)    a. s/p 2-V CABG 05/2016 (LIMA-LAD, VG-OM1)  . Carotid stenosis   . Charcot's gait    FOOT  . Claustrophobia   . Colon polyp   . Diabetes mellitus with complication ([place])   . Emphysema/COPD ([place])   . ESRD (end stage renal disease) on dialysis ([place])    DIAYLISIS M/W/F  . Fractures    Lt foot  . GERD (gastroesophageal reflux disease)   . Glaucoma   . History of kidney stones   . Hyperlipidemia   . Hypertension   . Migraine   . Orthopnea   . Oxygen deficiency    AS NEEDED  . PAD (peripheral artery disease) ([place])   . Pain    JOINTS  . Presence of permanent cardiac pacemaker    St. Jude   . Retinopathy    DIABETIC  . Stroke [facility]) 09/2015   Ischemic stroke with R sided hemiparesis    Past Surgical History:  Procedure Laterality Date  . ABDOMINAL HYSTERECTOMY    . AV FISTULA PLACEMENT     LEFT ARM  . CATARACT EXTRACTION W/PHACO Right 08/25/2016   Procedure: CATARACT EXTRACTION PHACO AND INTRAOCULAR LENS PLACEMENT (IOC) AND ANTERIOR VITRECTOMY;  Surgeon: Estill Cotta, MD;  Location: [facility] ORS;  Service: Ophthalmology;  Laterality: Right;  FLUID CASSETTE [id number] H, EXP 01/05/18  . CHOLECYSTECTOMY    . CORONARY ARTERY BYPASS GRAFT     05/13/2016  . INSERT / REPLACE / REMOVE PACEMAKER     6/17  . INSERTION OF AHMED VALVE Left 08/04/2017   Procedure: INSERTION OF  AHMED VALVE;  Surgeon: Leandrew Koyanagi, MD;  Location: [facility] ORS;  Service: Ophthalmology;  Laterality: Left;  . LOWER EXTREMITY ANGIOGRAPHY Left 06/13/2017   Procedure: Lower Extremity Angiography;  Surgeon: Algernon Huxley, MD;  Location: [place] CV LAB;  Service: Cardiovascular;  Laterality: Left;  . PACEMAKER IMPLANT    . PERIPHERAL VASCULAR CATHETERIZATION Left 04/08/2016   Procedure: A/V Shuntogram/Fistulagram;  Surgeon: Algernon Huxley, MD;  Location: [place] CV LAB;  Service: Cardiovascular;  Laterality: Left;  . PERIPHERAL VASCULAR CATHETERIZATION N/A 04/08/2016   Procedure: A/V Shunt Intervention;  Surgeon: Algernon Huxley, MD;  Location: [place] CV LAB;  Service: Cardiovascular;  Laterality: N/A;  . STONES     KIDNEY  . TONSILLECTOMY    . TUBAL LIGATION      Family History  Problem Relation Age of Onset  . Diabetes Mother   . Hypertension Mother   . Heart attack Mother     Social History:  reports that she quit smoking about 34 years ago. She has quit using smokeless tobacco. She reports that she does not drink alcohol or use drugs.  Allergies:  Allergies  Allergen Reactions  . Colchicine Anaphylaxis  . Clopidogrel     Medications:  Scheduled: . aspirin EC  81 mg Oral QHS  .  atorvastatin  80 mg Oral QPM  . [START ON 11/29/2017] calcium acetate  1,334 mg Oral TID WC  . Difluprednate  1 drop Left Eye Daily  . fluticasone  1 spray Each Nare Daily  . heparin  5,000 Units Subcutaneous Q8H  . [START ON 11/29/2017] insulin aspart  0-9 Units Subcutaneous TID WC  . ipratropium-albuterol  3 mL Inhalation Q6H  . irbesartan  150 mg Oral QHS  . latanoprost  1 drop Both Eyes QHS  . metoprolol succinate  25 mg Oral Daily  . moxifloxacin  1 drop Left Eye QID  . ticagrelor  60 mg Oral BID  . timolol  1 drop Left Eye BID    Results for orders placed or performed during the hospital encounter of 11/28/17 (from the past 48 hour(s))  Glucose, capillary     Status: Abnormal    Collection Time: 11/28/17  4:52 PM  Result Value Ref Range   Glucose-Capillary 170 (H) 65 - 99 mg/dL  Surgical PCR screen     Status: None   Collection Time: 11/28/17  5:37 PM  Result Value Ref Range   MRSA, PCR NEGATIVE NEGATIVE   Staphylococcus aureus NEGATIVE NEGATIVE    Comment: (NOTE) The Xpert SA Assay (FDA approved for NASAL specimens in patients 11 years of age and older), is one component of a comprehensive surveillance program. It is not intended to diagnose infection nor to guide or monitor treatment. Performed at [facility], [address] [place], [place] [address]   CBC     Status: Abnormal   Collection Time: 11/28/17  6:17 PM  Result Value Ref Range   WBC 7.4 3.6 - 11.0 K/uL   RBC 3.86 3.80 - 5.20 MIL/uL   Hemoglobin 11.0 (L) 12.0 - 16.0 g/dL   HCT 34.0 (L) 35.0 - 47.0 %   MCV 88.1 80.0 - 100.0 fL   MCH 28.6 26.0 - 34.0 pg   MCHC 32.4 32.0 - 36.0 g/dL   RDW 21.2 (H) 11.5 - 14.5 %   Platelets 310 150 - 440 K/uL    Comment: Performed at [facility], [address] [place], [place] [address]  Creatinine, serum     Status: Abnormal   Collection Time: 11/28/17  6:17 PM  Result Value Ref Range   Creatinine, Ser 4.20 (H) 0.44 - 1.00 mg/dL   GFR calc non Af Amer 10 (L) >60 mL/min   GFR calc Af Amer 12 (L) >60 mL/min    Comment: (NOTE) The eGFR has been calculated using the CKD EPI equation. This calculation has not been validated in all clinical situations. eGFR's persistently <60 mL/min signify possible Chronic Kidney Disease. Performed at [facility], [address] [place], [place] [address]   Glucose, capillary     Status: Abnormal   Collection Time: 11/28/17  9:10 PM  Result Value Ref Range   Glucose-Capillary 217 (H) 65 - 99 mg/dL    Dg Foot 2 Views Left  Result Date: 11/28/2017 CLINICAL DATA:  LEFT foot pain.  Gangrene. EXAM: LEFT FOOT - 2 VIEW COMPARISON:  None. FINDINGS: Lucency dorsum of the first distal  phalanx, limited assessment on AP view due to patient positioning. Splayed first and second metatarsal with corticated bony fragment interposed contiguous with midfoot. A second bony fragment projects within the medial midfoot without donor site. Osteopenia. Severe vascular calcifications. Dorsal and forefoot soft tissue swelling without subcutaneous gas or radiopaque foreign bodies. IMPRESSION: Focal lucency dorsum first distal phalanx  most consistent with focal osteomyelitis. 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 Electronically Signed   By: Elon Alas M.D.   On: 11/28/2017 19:33    Review of Systems  Constitutional: Negative for chills and weight loss.  HENT: Negative.   Eyes: Negative.   Respiratory: Negative.   Cardiovascular: Negative.   Gastrointestinal: Negative for nausea and vomiting.  Genitourinary: Negative.   Musculoskeletal:       Relates some pain in her left midfoot area.  Skin:       Recent drainage on her left great toe from some chronic gangrenous changes.  Neurological:       Chronic numbness and neuropathy related to her diabetes.  Endo/Heme/Allergies: Negative.   Psychiatric/Behavioral: Negative.    Blood pressure (!) 117/59, pulse 79, temperature 98.2 F (36.8 C), temperature source Oral, resp. rate 16, height 5' 1"  (1.549 m), weight 64.9 kg (143 lb), SpO2 96 %. Physical Exam  Cardiovascular:  DP and PT pulses are trace and thready on the left foot, significantly diminished on the right foot.  Musculoskeletal:  Chronic Charcot joint changes in the left foot.  Skin:  The skin is thin dry and atrophic with absent hair growth.  Some chronic gangrenous changes at the distal aspect of the left hallux with some exposed bone and joint at the hallux IPJ.    Assessment/Plan: Assessment: 1.  Diabetes with associated neuropathy. 2.  Peripheral vascular disease. 3.  Gangrene with  osteomyelitis left great toe.  Plan: Discussed with the patient the need for amputation for the left great toe.  We discussed possible risks and complications of the amputation including inability to heal due to her diabetes, peripheral vascular disease, or infection.  No guarantees could be given as to the outcome.  Questions were invited and answered.  Patient elects to proceed with surgery for amputation of the left great toe tomorrow.  Obtain consent for amputation left great toe.  N.p.o. after midnight.  Plan for surgery early tomorrow afternoon.  Durward Fortes 11/28/2017, 10:16 PM

## 2017-11-28 NOTE — Progress Notes (Signed)
Patient direct admit to 138 from Dr. Dory Larsenline's office with her sister. Patient alert and oriented. Left foot dressing changed in the presence of Dr. Elisabeth PigeonVachhani. Patient is a M-W-F dialysisi patient at [facility] on [address]. Patient did receive dialysis today.

## 2017-11-28 NOTE — Progress Notes (Signed)
Pharmacy Antibiotic Note  Nichole Hurst is a 67 y.o. female admitted on 11/28/2017 with possible osteomyelitis.  Pharmacy has been consulted for vancomycin and Zosyn dosing.  Per notes pt is a MWF HD pt and had HD today Called floor for weight  Plan: Zosyn 3.375 g IV q12h EI  Vancomycin 1500 mg IV x1 for loading dose Vancomycin 750 mg IV with each HD session - MWF Level before 3rd HD session Will need to continue to follow HD schedule and ensure doses are given     Temp (24hrs), Avg:98.6 F (37 C), Min:98.6 F (37 C), Max:98.6 F (37 C)  No results for input(s): WBC, CREATININE, LATICACIDVEN, VANCOTROUGH, VANCOPEAK, VANCORANDOM, GENTTROUGH, GENTPEAK, GENTRANDOM, TOBRATROUGH, TOBRAPEAK, TOBRARND, AMIKACINPEAK, AMIKACINTROU, AMIKACIN in the last 168 hours.  CrCl cannot be calculated (Patient's most recent lab result is older than the maximum 21 days allowed.).    Allergies  Allergen Reactions  . Colchicine Anaphylaxis  . Clopidogrel     Antimicrobials this admission: Vanc/Zosyn 1/21 >>  Dose adjustments this admission:   Microbiology results:   Thank you for allowing pharmacy to be a part of this patient's care.  Nichole Hurst, Nichole Hurst 11/28/2017 5:34 PM

## 2017-11-29 ENCOUNTER — Inpatient Hospital Stay: Payer: No Typology Code available for payment source | Admitting: Anesthesiology

## 2017-11-29 ENCOUNTER — Ambulatory Visit: Admission: RE | Admit: 2017-11-29 | Payer: Medicare (Managed Care) | Source: Ambulatory Visit | Admitting: Podiatry

## 2017-11-29 ENCOUNTER — Encounter
Admission: AD | Disposition: A | Discharge status: Home / Self Care | Payer: Self-pay | Source: Ambulatory Visit | Attending: Internal Medicine

## 2017-11-29 ENCOUNTER — Encounter: Payer: Self-pay | Admitting: *Deleted

## 2017-11-29 ENCOUNTER — Other Ambulatory Visit: Payer: Self-pay

## 2017-11-29 HISTORY — PX: AMPUTATION TOE: SHX6595

## 2017-11-29 LAB — CBC
HEMATOCRIT: 30.3 % — AB (ref 35.0–47.0)
Hemoglobin: 9.5 g/dL — ABNORMAL LOW (ref 12.0–16.0)
MCH: 27.8 pg (ref 26.0–34.0)
MCHC: 31.2 g/dL — ABNORMAL LOW (ref 32.0–36.0)
MCV: 89.1 fL (ref 80.0–100.0)
Platelets: 264 10*3/uL (ref 150–440)
RBC: 3.4 MIL/uL — AB (ref 3.80–5.20)
RDW: 21.5 % — ABNORMAL HIGH (ref 11.5–14.5)
WBC: 6.7 10*3/uL (ref 3.6–11.0)

## 2017-11-29 LAB — GLUCOSE, CAPILLARY
GLUCOSE-CAPILLARY: 103 mg/dL — AB (ref 65–99)
GLUCOSE-CAPILLARY: 146 mg/dL — AB (ref 65–99)
GLUCOSE-CAPILLARY: 224 mg/dL — AB (ref 65–99)
Glucose-Capillary: 182 mg/dL — ABNORMAL HIGH (ref 65–99)
Glucose-Capillary: 136 mg/dL — ABNORMAL HIGH (ref 65–99)

## 2017-11-29 LAB — BASIC METABOLIC PANEL
Anion gap: 10 (ref 5–15)
BUN: 22 mg/dL — ABNORMAL HIGH (ref 6–20)
CO2: 33 mmol/L — AB (ref 22–32)
Calcium: 8.7 mg/dL — ABNORMAL LOW (ref 8.9–10.3)
Chloride: 93 mmol/L — ABNORMAL LOW (ref 101–111)
Creatinine, Ser: 5.34 mg/dL — ABNORMAL HIGH (ref 0.44–1.00)
GFR calc non Af Amer: 8 mL/min — ABNORMAL LOW (ref >60)
GFR, EST AFRICAN AMERICAN: 9 mL/min — AB (ref >60)
Glucose, Bld: 228 mg/dL — ABNORMAL HIGH (ref 65–99)
POTASSIUM: 4 mmol/L (ref 3.5–5.1)
Sodium: 136 mmol/L (ref 135–145)

## 2017-11-29 LAB — HEMOGLOBIN A1C
Hgb A1c MFr Bld: 7.2 % — ABNORMAL HIGH (ref 4.8–5.6)
MEAN PLASMA GLUCOSE: 159.94 mg/dL

## 2017-11-29 SURGERY — AMPUTATION, TOE
Anesthesia: General | Laterality: Left

## 2017-11-29 MED ORDER — METOPROLOL TARTRATE 5 MG/5ML IV SOLN
INTRAVENOUS | Status: DC | PRN
Start: 2017-11-29 — End: 2017-11-29
  Administered 2017-11-29: 3 mg via INTRAVENOUS

## 2017-11-29 MED ORDER — INSULIN GLARGINE 100 UNIT/ML ~~LOC~~ SOLN
5.0000 [IU] | Freq: Every day | SUBCUTANEOUS | Status: DC
  Filled 2017-11-29 (×3): qty 0.05

## 2017-11-29 MED ORDER — NEOMYCIN-POLYMYXIN B GU 40-200000 IR SOLN
Status: AC
  Filled 2017-11-29: qty 2

## 2017-11-29 MED ORDER — MIDAZOLAM HCL 5 MG/5ML IJ SOLN
INTRAMUSCULAR | Status: DC | PRN
  Administered 2017-11-29: 2 mg via INTRAVENOUS

## 2017-11-29 MED ORDER — NEOMYCIN-POLYMYXIN B GU 40-200000 IR SOLN
Status: DC | PRN
  Administered 2017-11-29: 2 mL

## 2017-11-29 MED ORDER — PROMETHAZINE HCL 25 MG/ML IJ SOLN: 6.2500 mg | INTRAMUSCULAR | Status: DC | PRN

## 2017-11-29 MED ORDER — FENTANYL CITRATE (PF) 100 MCG/2ML IJ SOLN: 25.0000 ug | INTRAMUSCULAR | Status: DC | PRN

## 2017-11-29 MED ORDER — BUPIVACAINE HCL (PF) 0.5 % IJ SOLN
INTRAMUSCULAR | Status: AC
  Filled 2017-11-29: qty 30

## 2017-11-29 MED ORDER — PROPOFOL 500 MG/50ML IV EMUL
INTRAVENOUS | Status: DC | PRN
  Administered 2017-11-29: 25 ug/kg/min via INTRAVENOUS

## 2017-11-29 MED ORDER — PHENYLEPHRINE HCL 10 MG/ML IJ SOLN
INTRAMUSCULAR | Status: DC | PRN
  Administered 2017-11-29 (×3): 100 ug via INTRAVENOUS
  Administered 2017-11-29: 200 ug via INTRAVENOUS

## 2017-11-29 MED ORDER — MEPERIDINE HCL 50 MG/ML IJ SOLN: 6.2500 mg | INTRAMUSCULAR | Status: DC | PRN

## 2017-11-29 MED ORDER — PROPOFOL 10 MG/ML IV BOLUS
INTRAVENOUS | Status: DC | PRN
  Administered 2017-11-29: 40 mg via INTRAVENOUS

## 2017-11-29 MED ORDER — SODIUM CHLORIDE 0.9 % IV SOLN
INTRAVENOUS | Status: DC | PRN
  Administered 2017-11-29: 13:00:00 via INTRAVENOUS

## 2017-11-29 MED ORDER — OXYCODONE HCL 5 MG PO TABS: 5.0000 mg | ORAL_TABLET | Freq: Once | ORAL | Status: DC | PRN

## 2017-11-29 MED ORDER — OXYCODONE HCL 5 MG/5ML PO SOLN: 5.0000 mg | Freq: Once | ORAL | Status: DC | PRN

## 2017-11-29 MED ORDER — HYDROCODONE-ACETAMINOPHEN 5-325 MG PO TABS
1.0000 | ORAL_TABLET | ORAL | Status: DC | PRN
  Administered 2017-11-29 – 2017-11-30 (×5): 1 via ORAL
  Filled 2017-11-29 (×5): qty 1

## 2017-11-29 MED ORDER — BUPIVACAINE HCL 0.5 % IJ SOLN
INTRAMUSCULAR | Status: DC | PRN
  Administered 2017-11-29: 10 mL

## 2017-11-29 SURGICAL SUPPLY — 44 items
BANDAGE ACE 4X5 VEL STRL LF (GAUZE/BANDAGES/DRESSINGS) ×3 IMPLANT
BLADE MED AGGRESSIVE (BLADE) IMPLANT
BLADE OSC/SAGITTAL MD 5.5X18 (BLADE) ×3 IMPLANT
BLADE SURG 15 STRL LF DISP TIS (BLADE) ×2 IMPLANT
BLADE SURG 15 STRL SS (BLADE) ×4
BLADE SURG MINI STRL (BLADE) ×3 IMPLANT
BNDG ESMARK 4X12 TAN STRL LF (GAUZE/BANDAGES/DRESSINGS) IMPLANT
BNDG GAUZE 4.5X4.1 6PLY STRL (MISCELLANEOUS) ×3 IMPLANT
CANISTER SUCT 1200ML W/VALVE (MISCELLANEOUS) ×3 IMPLANT
CLOSURE WOUND 1/4X4 (GAUZE/BANDAGES/DRESSINGS) ×1
CUFF TOURN 18 STER (MISCELLANEOUS) ×3 IMPLANT
CUFF TOURN DUAL PL 12 NO SLV (MISCELLANEOUS) IMPLANT
DRAPE FLUOR MINI C-ARM 54X84 (DRAPES) IMPLANT
DURAPREP 26ML APPLICATOR (WOUND CARE) ×3 IMPLANT
ELECT REM PT RETURN 9FT ADLT (ELECTROSURGICAL) ×3
ELECTRODE REM PT RTRN 9FT ADLT (ELECTROSURGICAL) ×1 IMPLANT
GAUZE PETRO XEROFOAM 1X8 (MISCELLANEOUS) ×3 IMPLANT
GAUZE SPONGE 4X4 12PLY STRL (GAUZE/BANDAGES/DRESSINGS) ×3 IMPLANT
GAUZE STRETCH 2X75IN STRL (MISCELLANEOUS) ×3 IMPLANT
GLOVE BIO SURGEON STRL SZ7.5 (GLOVE) ×3 IMPLANT
GLOVE INDICATOR 8.0 STRL GRN (GLOVE) ×3 IMPLANT
GOWN STRL REUS W/ TWL LRG LVL3 (GOWN DISPOSABLE) ×2 IMPLANT
GOWN STRL REUS W/TWL LRG LVL3 (GOWN DISPOSABLE) ×4
HANDPIECE VERSAJET DEBRIDEMENT (MISCELLANEOUS) ×3 IMPLANT
KIT RM TURNOVER STRD PROC AR (KITS) ×3 IMPLANT
LABEL OR SOLS (LABEL) ×3 IMPLANT
NEEDLE FILTER BLUNT 18X 1/2SAF (NEEDLE) ×2
NEEDLE FILTER BLUNT 18X1 1/2 (NEEDLE) ×1 IMPLANT
NEEDLE HYPO 25X1 1.5 SAFETY (NEEDLE) ×6 IMPLANT
NS IRRIG 500ML POUR BTL (IV SOLUTION) ×3 IMPLANT
PACK EXTREMITY ARMC (MISCELLANEOUS) ×3 IMPLANT
SOL .9 NS 3000ML IRR  AL (IV SOLUTION) ×2
SOL .9 NS 3000ML IRR UROMATIC (IV SOLUTION) ×1 IMPLANT
SOL PREP PVP 2OZ (MISCELLANEOUS) ×3
SOLUTION PREP PVP 2OZ (MISCELLANEOUS) ×1 IMPLANT
STOCKINETTE STRL 6IN 960660 (GAUZE/BANDAGES/DRESSINGS) ×3 IMPLANT
STRIP CLOSURE SKIN 1/4X4 (GAUZE/BANDAGES/DRESSINGS) ×2 IMPLANT
SUT ETHILON 3-0 FS-10 30 BLK (SUTURE) ×3
SUT ETHILON 4-0 (SUTURE)
SUT ETHILON 4-0 FS2 18XMFL BLK (SUTURE)
SUTURE EHLN 3-0 FS-10 30 BLK (SUTURE) ×1 IMPLANT
SUTURE ETHLN 4-0 FS2 18XMF BLK (SUTURE) IMPLANT
SWAB DUAL CULTURE TRANS RED ST (MISCELLANEOUS) ×3 IMPLANT
SYR 10ML LL (SYRINGE) ×3 IMPLANT

## 2017-11-29 NOTE — OR Nursing (Signed)
Spoke with Dr. Juliene PinaMody.  OK to order telemetry for 24 hours.  Order placed.

## 2017-11-29 NOTE — Progress Notes (Signed)
[employer] [facility] [place] at [facility]   PATIENT NAME: Nichole EvensGloria Hurst    MR#:  [id number]  DATE OF BIRTH:  12/01/1950  SUBJECTIVE:   No acute issues overnight. Plan to go to the operating room today  REVIEW OF SYSTEMS:    Review of Systems  Constitutional: Negative for fever, chills weight loss HENT: Negative for ear pain, nosebleeds, congestion, facial swelling, rhinorrhea, neck pain, neck stiffness and ear discharge.   Respiratory: Negative for cough, shortness of breath, wheezing  Cardiovascular: Negative for chest pain, palpitations and leg swelling.  Gastrointestinal: Negative for heartburn, abdominal pain, vomiting, diarrhea or consitpation Genitourinary: Negative for dysuria, urgency, frequency, hematuria Musculoskeletal: Negative for back pain or joint pain Neurological: Negative for dizziness, seizures, syncope, focal weakness,  numbness and headaches.  Hematological: Does not bruise/bleed easily.  Psychiatric/Behavioral: Negative for hallucinations, confusion, dysphoric mood Skin: Left great toe with gangrene   Tolerating Diet: Nothing by mouth      DRUG ALLERGIES:   Allergies  Allergen Reactions  . Colchicine Anaphylaxis  . Clopidogrel     VITALS:  Blood pressure (!) 143/69, pulse 74, temperature 98.1 F (36.7 C), temperature source Oral, resp. rate 16, height 5\' 1"  (1.549 m), weight 64.9 kg (143 lb), SpO2 98 %.  PHYSICAL EXAMINATION:  Constitutional: Appears well-developed and well-nourished. No distress. HENT: Normocephalic. Marland Kitchen. Oropharynx is clear and moist.  Eyes: Conjunctivae and EOM are normal. PERRLA, no scleral icterus.  Neck: Normal ROM. Neck supple. No JVD. No tracheal deviation. CVS: RRR, S1/S2 +, no murmurs, no gallops, no carotid bruit.  Pulmonary: Effort and breath sounds normal, no stridor, rhonchi, wheezes, rales.  Abdominal: Soft. BS +,  no distension, tenderness, rebound or guarding.  Musculoskeletal: Normal range of motion.  No edema and no tenderness.  Neuro: Alert. CN 2-12 grossly intact. No focal deficits. Skin: Left foot is wrapped  Psychiatric: Normal mood and affect.      LABORATORY PANEL:   CBC Recent Labs  Lab 11/29/17 0306  WBC 6.7  HGB 9.5*  HCT 30.3*  PLT 264   ------------------------------------------------------------------------------------------------------------------  Chemistries  Recent Labs  Lab 11/29/17 0306  NA 136  K 4.0  CL 93*  CO2 33*  GLUCOSE 228*  BUN 22*  CREATININE 5.34*  CALCIUM 8.7*   ------------------------------------------------------------------------------------------------------------------  Cardiac Enzymes No results for input(s): TROPONINI in the last 168 hours. ------------------------------------------------------------------------------------------------------------------  RADIOLOGY:  Dg Foot 2 Views Left  Result Date: 11/28/2017 CLINICAL DATA:  LEFT foot pain.  Gangrene. EXAM: LEFT FOOT - 2 VIEW COMPARISON:  None. FINDINGS: Lucency dorsum of the first distal phalanx, limited assessment on AP view due to patient positioning. Splayed first and second metatarsal with corticated bony fragment interposed contiguous with midfoot. A second bony fragment projects within the medial midfoot without donor site. Osteopenia. Severe vascular calcifications. Dorsal and forefoot soft tissue swelling without subcutaneous gas or radiopaque foreign bodies. IMPRESSION: Focal lucency dorsum first distal phalanx most consistent with focal osteomyelitis. 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 Electronically Signed   By: Awilda Metroourtnay  Bloomer M.D.   On: 11/28/2017 19:33     ASSESSMENT AND PLAN:   67 year old female with end-stage renal disease on hemodialysis, CAD, diabetes and peripheral vascular disease Who is admitted from the podiatry office for amputation of the left great toe due  to wet gangrene and osteomyelitis.   1. Wet gangrene/osteomyelitis left great toe: Plan for amputation of left great toe today  Continue  Zosyn and vancomycin ID consultation requested Further management as per podiatry  2. End-stage renal disease on hematemesis: Nephrology consultation requested for dialysis Monday, Wednesday and Friday.  3. CAD: Continue metoprolol,Brillanta, aspirin and atorvastatin  4. Essential hypertension: Continue Avapro and metoprolol  5. Diabetes: Continue sliding scale Diabetes nurse consultation requested Check A1c Start low dose Lantus tonight  6. Peripheral vascular disease: Continue statin and aspirin    Management plans discussed with the patient and she is in agreement.  CODE STATUS: full  TOTAL TIME TAKING CARE OF THIS PATIENT: 30 minutes.     POSSIBLE D/C 2 days, DEPENDING ON CLINICAL CONDITION.   Seda Kronberg M.D on 11/29/2017 at 8:11 AM  Between 7am to 6pm - Pager - [phone] After 6pm go to [email or web address] - password [email or web address]  [facility] [place] [facility]  Office  [phone]  CC: Primary care physician; System, Pcp Not In  Note: This dictation was prepared with Dragon dictation along with smaller phrase technology. Any transcriptional errors that result from this process are unintentional.

## 2017-11-29 NOTE — Anesthesia Preprocedure Evaluation (Signed)
Anesthesia Evaluation  Patient identified by MRN, date of birth, ID band Patient awake    Reviewed: Allergy & Precautions, NPO status , Patient's Chart, lab work & pertinent test results  History of Anesthesia Complications Negative for: history of anesthetic complications  Airway Mallampati: III       Dental  (+) Upper Dentures, Edentulous Lower   Pulmonary neg sleep apnea, COPD, former smoker,    breath sounds clear to auscultation- rhonchi (-) wheezing      Cardiovascular hypertension, + CAD, + CABG, + Peripheral Vascular Disease, +CHF and + Orthopnea  + pacemaker  Rhythm:Regular Rate:Normal - Systolic murmurs and - Diastolic murmurs Echo 08/09/17: - Left ventricle: The cavity size was normal. Wall thickness was   increased in a pattern of moderate LVH. Systolic function was   normal. The estimated ejection fraction was in the range of 55%   to 60%. Wall motion was normal; there were no regional wall   motion abnormalities. Features are consistent with a pseudonormal   left ventricular filling pattern, with concomitant abnormal   relaxation and increased filling pressure (grade 2 diastolic   dysfunction). Doppler parameters are consistent with high   ventricular filling pressure. - Mitral valve: Calcified annulus. Mildly thickened, mildly   calcified leaflets . Mitral valve gradient was mildly elevated.   There was mild to moderate regurgitation. - Left atrium: The atrium was mildly dilated. - Right ventricle: The cavity size was mildly dilated. Systolic   function was normal. - Right atrium: The atrium was mildly dilated. - Tricuspid valve: There was severe regurgitation. - Pulmonary arteries: Systolic pressure was moderately increased,   in the range of 60 mm Hg to 65 mm Hg.   Neuro/Psych  Headaches, Anxiety CVA, No Residual Symptoms    GI/Hepatic Neg liver ROS, GERD  ,  Endo/Other  diabetes  Renal/GU ESRF and  DialysisRenal disease     Musculoskeletal   Abdominal (+) - obese,   Peds  Hematology  (+) anemia ,   Anesthesia Other Findings   Reproductive/Obstetrics                             Anesthesia Physical Anesthesia Plan  ASA: IV  Anesthesia Plan: General   Post-op Pain Management:    Induction: Intravenous  PONV Risk Score and Plan: 2 and Propofol infusion  Airway Management Planned: Natural Airway  Additional Equipment:   Intra-op Plan:   Post-operative Plan:   Informed Consent: I have reviewed the patients History and Physical, chart, labs and discussed the procedure including the risks, benefits and alternatives for the proposed anesthesia with the patient or authorized representative who has indicated his/her understanding and acceptance.   Dental advisory given  Plan Discussed with: CRNA and Anesthesiologist  Anesthesia Plan Comments:         Anesthesia Quick Evaluation

## 2017-11-29 NOTE — Anesthesia Post-op Follow-up Note (Signed)
Anesthesia QCDR form completed.        

## 2017-11-29 NOTE — Progress Notes (Signed)
[facility]  ROUNDING NOTE   Subjective:  Patient well-known to [place] from prior admission. She presents with osteomyelitis of the left great toe and will be undergoing. She did have hemodialysis performed yesterday. She states that she left 1 kg over her dry weight.    Objective:  Vital signs in last 24 hours:  Temp:  [98.1 F (36.7 C)-98.6 F (37 C)] 98.1 F (36.7 C) (01/22 0728) Pulse Rate:  [74-105] 74 (01/22 0728) Resp:  [16] 16 (01/22 0728) BP: (117-152)/(55-69) 143/69 (01/22 0728) SpO2:  [76 %-98 %] 98 % (01/22 0728) Weight:  [64.9 kg (143 lb)] 64.9 kg (143 lb) (01/21 1741)  Weight change:  Filed Weights   11/28/17 1741  Weight: 64.9 kg (143 lb)    Intake/Output: I/O last 3 completed shifts: In: 240 [P.O.:240] Out: -    Intake/Output this shift:  No intake/output data recorded.  Physical Exam: General: No acute distress  Head: Normocephalic, atraumatic. Moist oral mucosal membranes  Eyes: Anicteric  Neck: Supple, trachea midline  Lungs:  Clear to auscultation, normal effort  Heart: S1S2 no rubs  Abdomen:  Soft, nontender, bowel sounds present  Extremities: Trace peripheral edema.  Neurologic: Awake, alert, following commands  Skin: No lesions  Access:  Left upper extremity AV fistula    Basic Metabolic Panel: Recent Labs  Lab 11/28/17 1817 11/29/17 0306  NA  --  136  K  --  4.0  CL  --  93*  CO2  --  33*  GLUCOSE  --  228*  BUN  --  22*  CREATININE 4.20* 5.34*  CALCIUM  --  8.7*    Liver Function Tests: No results for input(s): AST, ALT, ALKPHOS, BILITOT, PROT, ALBUMIN in the last 168 hours. No results for input(s): LIPASE, AMYLASE in the last 168 hours. No results for input(s): AMMONIA in the last 168 hours.  CBC: Recent Labs  Lab 11/28/17 1817 11/29/17 0306  WBC 7.4 6.7  HGB 11.0* 9.5*  HCT 34.0* 30.3*  MCV 88.1 89.1  PLT 310 264    Cardiac Enzymes: No results for input(s): CKTOTAL, CKMB, CKMBINDEX, TROPONINI in  the last 168 hours.  BNP: Invalid input(s): POCBNP  CBG: Recent Labs  Lab 11/28/17 1652 11/28/17 2110 11/29/17 0746  GLUCAP 170* 217* 182*    Microbiology: Results for orders placed or performed during the hospital encounter of 11/28/17  Surgical PCR screen     Status: None   Collection Time: 11/28/17  5:37 PM  Result Value Ref Range Status   MRSA, PCR NEGATIVE NEGATIVE Final   Staphylococcus aureus NEGATIVE NEGATIVE Final    Comment: (NOTE) The Xpert SA Assay (FDA approved for NASAL specimens in patients 67 years of age and older), is one component of a comprehensive surveillance program. It is not intended to diagnose infection nor to guide or monitor treatment. Performed at [facility], [address] [place], [place] [address]     Coagulation Studies: No results for input(s): LABPROT, INR in the last 72 hours.  Urinalysis: No results for input(s): COLORURINE, LABSPEC, PHURINE, GLUCOSEU, HGBUR, BILIRUBINUR, KETONESUR, PROTEINUR, UROBILINOGEN, NITRITE, LEUKOCYTESUR in the last 72 hours.  Invalid input(s): APPERANCEUR    Imaging: Dg Foot 2 Views Left  Result Date: 11/28/2017 CLINICAL DATA:  LEFT foot pain.  Gangrene. EXAM: LEFT FOOT - 2 VIEW COMPARISON:  None. FINDINGS: Lucency dorsum of the first distal phalanx, limited assessment on AP view due to patient positioning. Splayed first and second metatarsal with corticated  bony fragment interposed contiguous with midfoot. A second bony fragment projects within the medial midfoot without donor site. Osteopenia. Severe vascular calcifications. Dorsal and forefoot soft tissue swelling without subcutaneous gas or radiopaque foreign bodies. IMPRESSION: Focal lucency dorsum first distal phalanx most consistent with focal osteomyelitis. 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 Electronically Signed   By: Awilda Metroourtnay  Bloomer M.D.   On:  11/28/2017 19:33     Medications:   . piperacillin-tazobactam (ZOSYN)  IV 3.375 g (11/29/17 1006)  . [START ON 11/30/2017] vancomycin     . aspirin EC  81 mg Oral QHS  . atorvastatin  80 mg Oral QPM  . calcium acetate  1,334 mg Oral TID WC  . Difluprednate  1 drop Left Eye Daily  . fluticasone  1 spray Each Nare Daily  . heparin  5,000 Units Subcutaneous Q8H  . insulin aspart  0-9 Units Subcutaneous TID WC  . insulin glargine  5 Units Subcutaneous Daily  . irbesartan  150 mg Oral QHS  . latanoprost  1 drop Both Eyes QHS  . metoprolol succinate  25 mg Oral Daily  . moxifloxacin  1 drop Left Eye QID  . ticagrelor  60 mg Oral BID  . timolol  1 drop Left Eye BID   docusate sodium, ipratropium-albuterol, nitroGLYCERIN, ondansetron, traMADol  Assessment/ Plan:  67 y.o. female with ESRD on HD, [facility] [address], MWF,Stroke 2016, residual rt sided weakness improved now, CABG- [facility], 2017 , Pacemaker, COPD, Left foot fracture, Left eye surgery Friday, was admitted on January 21 left great toe osteomyelitis.  [facility] [address]  1.  ESRD on HD MWF: Patient did have hemodialysis yesterday.  She states that she was 1 kg over her dry weight.  We will plan for hemodialysis again tomorrow.  No urgent indication for dialysis today.  Serum potassium acceptable at 4.0.  2.  Anemia of chronic kidney disease.  Hemoglobin currently 9.5.  Hold off on Epogen for now.  3.  Secondary hyperparathyroidism.  Check intact PTH and phosphorus with dialysis tomorrow.  Otherwise continue calcium acetate 2 tablets p.o. 3 times daily with meals.  4.  Hypertension.  Continue metoprolol and irbesartan.       LOS: 1 Leopoldo Mazzie 1/22/201911:36 AM

## 2017-11-29 NOTE — Transfer of Care (Signed)
Immediate Anesthesia Transfer of Care Note  Patient: Nichole Hurst  Procedure(s) Performed: AMPUTATION TOE-LEFT GREAT TOE (Left )  Patient Location: PACU  Anesthesia Type:General  Level of Consciousness: awake  Airway & Oxygen Therapy: Patient Spontanous Breathing and Patient connected to face mask oxygen  Post-op Assessment: Report given to RN and Post -op Vital signs reviewed and stable  Post vital signs: Reviewed  Last Vitals:  Vitals:   11/29/17 1237 11/29/17 1402  BP: (!) 154/54 (!) 154/62  Pulse: 69   Resp: 18 17  Temp: (!) 36.2 C   SpO2: 98% 100%    Last Pain:  Vitals:   11/29/17 1237  TempSrc: Tympanic         Complications: No apparent anesthesia complications

## 2017-11-29 NOTE — Interval H&P Note (Signed)
History and Physical Interval Note:  11/29/2017 12:44 PM  Nichole Hurst  has presented today for surgery, with the diagnosis of N/A  The various methods of treatment have been discussed with the patient and family. After consideration of risks, benefits and other options for treatment, the patient has consented to  Procedure(s): AMPUTATION TOE-LEFT GREAT TOE (Left) as a surgical intervention .  The patient's history has been reviewed, patient examined, no change in status, stable for surgery.  I have reviewed the patient's chart and labs.  Questions were answered to the patient's satisfaction.     Ricci Barkerodd W Kalley Nicholl

## 2017-11-29 NOTE — Progress Notes (Signed)
Took over care from [facility] RN. Patient had arrived post left great toe amputation. No complaints of pain at this time, patient alert and oriented, no drainage on surgical dressing.

## 2017-11-29 NOTE — Op Note (Signed)
Date of operation: 11/29/2017.  Surgeon: Durward Fortes D.P.M.  Preoperative diagnosis: Gangrene with osteomyelitis left great toe.  Postoperative diagnosis: Same.  Procedure: Amputation left great toe.  Anesthesia: Local MAC.  Hemostasis: None.  Estimated blood loss: 5-10 cc.  Pathology: Left great toe.  Cultures: Bone cultures distal phalanx left great toe.  Complications: None apparent.  Operative indications: This is a 67 year old female with diabetes and peripheral vascular disease with a stable dry gangrene over the last 3 or 4 months that recently became acted.  Decision was made for admission to the hospital for amputation.  Operative procedure: Patient was taken to the operating room and placed on the table in the supine position.  Following satisfactory sedation the left foot was anesthetized with 10 cc of 0.5% bupivacaine plain around the first metatarsal.  The foot was then prepped and draped in the usual sterile fashion.  Attention was then directed to the distal aspect of the left foot where an elliptical fishmouth incision was made coursing medial to lateral around the base of the great toe.  The incision was deepened sharply down to the level of the bone dissection carried back to the proximal aspect of the shaft of the proximal phalanx which was then incised using a sagittal saw and the distal aspect of the toe removed intact with care taken to leave the metatarsal phalangeal joint intact.  Good healthy tissues were noted at the amputation site.  The wound was flushed with copious amounts of sterile saline and closed using 3-0 nylon vertical mattress and simple interrupted sutures.  Xeroform 4 x 4's conformer Kerlix and an Ace wrap applied to the left lower extremity.  Patient tolerated the procedure and anesthesia well and was transported to the PACU with vital signs stable and in good condition.

## 2017-11-29 NOTE — Clinical Social Work Placement (Signed)
   CLINICAL SOCIAL WORK PLACEMENT  NOTE  Date:  11/29/2017  Patient Details  Name: Nichole Hurst MRN: [id number] Date of Birth: 10/10/1951  Clinical Social Work is seeking post-discharge placement for this patient at the Skilled  Nursing Facility level of care (*CSW will initial, date and re-position this form in  chart as items are completed):  Yes   Patient/family provided with [facility] Clinical Social Work Department's list of facilities offering this level of care within the geographic area requested by the patient (or if unable, by the patient's family).  Yes   Patient/family informed of their freedom to choose among providers that offer the needed level of care, that participate in Medicare, Medicaid or managed care program needed by the patient, have an available bed and are willing to accept the patient.  Yes   Patient/family informed of [facility]'s ownership interest in [facility] and [facility], as well as of the fact that they are under no obligation to receive care at these facilities.  PASRR submitted to EDS on       PASRR number received on       Existing PASRR number confirmed on 11/29/17     FL2 transmitted to all facilities in geographic area requested by pt/family on 11/29/17     FL2 transmitted to all facilities within larger geographic area on       Patient informed that his/her managed care company has contracts with or will negotiate with certain facilities, including the following:            Patient/family informed of bed offers received.  Patient chooses bed at       Physician recommends and patient chooses bed at      Patient to be transferred to   on  .  Patient to be transferred to facility by       Patient family notified on   of transfer.  Name of family member notified:        PHYSICIAN       Additional Comment:    _______________________________________________ Zoee Heeney, Darleen CrockerBailey M, LCSW 11/29/2017, 4:21 PM

## 2017-11-29 NOTE — Progress Notes (Signed)
Inpatient Diabetes Program Recommendations  AACE/ADA: New Consensus Statement on Inpatient Glycemic Control (2015)  Target Ranges:  Prepandial:   less than 140 mg/dL      [facility] postprandial:   less than 180 mg/dL (1-2 hours)      Critically ill patients:  140 - 180 mg/dL    Results for [name], Nichole (MRN [id number]) as of 11/29/2017 09:05  Ref. Range 11/28/2017 16:52 11/28/2017 21:10 11/29/2017 07:46  Glucose-Capillary Latest Ref Range: 65 - 99 mg/dL 469170 (H) 629217 (H) 528182 (H)    Admit with: Wet gangrene/osteomyelitis left great toe  History: DM, ESRD  Home DM Meds: None listed  Current Insulin Orders: Lantus 5 units daily      Novolog Sensitive Correction Scale/ SSI (0-9 units) TID AC      Note patient NPO this AM for amputation of toe.    Current Hemoglobin A1c results pending.  Note Lantus and Novolog to start this AM.  Agree with current orders.      --Will follow patient during hospitalization--  Ambrose FinlandJeannine Johnston Parys Elenbaas RN, MSN, CDE Diabetes Coordinator Inpatient Glycemic Control Team Team Pager: [phone] (8a-5p)

## 2017-11-29 NOTE — Progress Notes (Signed)
During OR procedure pt had intermittent runs of tachycardia with HR 120-140s that appear to be V-paced with associated hypotension. Given a dose of metoprolol in OR and HR now stable in 70-80s without pacing. Most recent pacemaker evaluation showed that she had occasional episodes of afib, none lasting longer than 20 seconds. This appears to be consistent with what we saw in the operating room. Will continue to monitor for additional events in the PACU.

## 2017-11-29 NOTE — [place] FL2 (Signed)
[place] MEDICAID FL2 LEVEL OF CARE SCREENING TOOL     IDENTIFICATION  Patient Name: Nichole Hurst Birthdate: June 18, 1951 Sex: female Admission Date (Current Location): 11/28/2017  [place] and [place] Number:  Chiropodist and Address:  [facility], [address], [place], [place] [address]      Provider Number: [id number]  Attending Physician Name and Address:  Adrian Saran, MD  Relative Name and Phone Number:       Current Level of Care: Hospital Recommended Level of Care: Skilled Nursing Facility Prior Approval Number:    Date Approved/Denied:   PASRR Number: ([id number] )  Discharge Plan: SNF    Current Diagnoses: Patient Active Problem List   Diagnosis Date Noted  . Gangrene of toe of left foot (HCC) 11/28/2017  . Gangrene (HCC) 11/28/2017  . Pulmonary edema 08/08/2017  . Atherosclerosis of native arteries of the extremities with gangrene (HCC) 06/08/2017  . CHF (congestive heart failure) (HCC) 10/30/2015  . History of stroke with residual deficit 10/30/2015  . Hyperlipidemia 10/30/2015  . Cerebrovascular accident (CVA) (HCC) 09/09/2015  . CAD in native artery 08/29/2015  . Carotid artery narrowing 11/26/2014  . Anemia of chronic disease 09/03/2014  . End stage renal failure on dialysis (HCC) 09/03/2014  . Chest pain 07/12/2014  . Breath shortness 07/12/2014  . Type 2 diabetes mellitus with end-stage renal disease (HCC) 03/17/2014  . BP (high blood pressure) 03/17/2014    Orientation RESPIRATION BLADDER Height & Weight     Self, Time, Situation, Place  Normal Continent Weight: 143 lb (64.9 kg) Height:  5\' 1"  (154.9 cm)  BEHAVIORAL SYMPTOMS/MOOD NEUROLOGICAL BOWEL NUTRITION STATUS      Continent Diet(Diet: Carb Modified. )  AMBULATORY STATUS COMMUNICATION OF NEEDS Skin   Extensive Assist Verbally Surgical wounds(Incision: Left Foot. )                       Personal Care Assistance Level of Assistance  Bathing,  Feeding, Dressing Bathing Assistance: Limited assistance Feeding assistance: Independent Dressing Assistance: Limited assistance     Functional Limitations Info  Sight, Hearing, Speech Sight Info: Adequate Hearing Info: Adequate Speech Info: Adequate    SPECIAL CARE FACTORS FREQUENCY  PT (By licensed PT), OT (By licensed OT)(Dialysis patient goes to [facility]. [place] MWF )     PT Frequency: (5) OT Frequency: (5)            Contractures      Additional Factors Info  Code Status, Allergies Code Status Info: (Full Code. ) Allergies Info: (Colchicine, Clopidogrel. )           Current Medications (11/29/2017):  This is the current hospital active medication list Current Facility-Administered Medications  Medication Dose Route Frequency Provider Last Rate Last Dose  . aspirin EC tablet 81 mg  81 mg Oral QHS Altamese Dilling, MD      . atorvastatin (LIPITOR) tablet 80 mg  80 mg Oral QPM Altamese Dilling, MD   80 mg at 11/28/17 2157  . calcium acetate (PHOSLO) capsule 1,334 mg  1,334 mg Oral TID WC Altamese Dilling, MD      . Difluprednate 0.05 % EMUL 1 drop  1 drop Left Eye Daily Altamese Dilling, MD      . docusate sodium (COLACE) capsule 100 mg  100 mg Oral BID PRN Altamese Dilling, MD      . fluticasone (FLONASE) 50 MCG/ACT nasal spray 1 spray  1 spray Each Nare Daily  Altamese DillingVachhani, Vaibhavkumar, MD      . heparin injection 5,000 Units  5,000 Units Subcutaneous Q8H Altamese DillingVachhani, Vaibhavkumar, MD   Stopped at 11/29/17 0600  . HYDROcodone-acetaminophen (NORCO/VICODIN) 5-325 MG per tablet 1 tablet  1 tablet Oral Q4H PRN Linus Galasline, Todd, DPM      . insulin aspart (novoLOG) injection 0-9 Units  0-9 Units Subcutaneous TID WC Altamese DillingVachhani, Vaibhavkumar, MD   2 Units at 11/29/17 [phone]  . insulin glargine (LANTUS) injection 5 Units  5 Units Subcutaneous Daily Mody, Sital, MD      . ipratropium-albuterol (DUONEB) 0.5-2.5 (3) MG/3ML nebulizer solution 3 mL  3  mL Inhalation Q6H PRN Mody, Sital, MD      . irbesartan (AVAPRO) tablet 150 mg  150 mg Oral QHS Altamese DillingVachhani, Vaibhavkumar, MD      . latanoprost (XALATAN) 0.005 % ophthalmic solution 1 drop  1 drop Both Eyes QHS Altamese DillingVachhani, Vaibhavkumar, MD      . metoprolol succinate (TOPROL-XL) 24 hr tablet 25 mg  25 mg Oral Daily Altamese DillingVachhani, Vaibhavkumar, MD   25 mg at 11/29/17 0959  . moxifloxacin (VIGAMOX) 0.5 % ophthalmic solution 1 drop  1 drop Left Eye QID Altamese DillingVachhani, Vaibhavkumar, MD      . nitroGLYCERIN (NITROSTAT) SL tablet 0.4 mg  0.4 mg Sublingual Q5 min PRN Altamese DillingVachhani, Vaibhavkumar, MD      . ondansetron (ZOFRAN) tablet 4 mg  4 mg Oral Q8H PRN Altamese DillingVachhani, Vaibhavkumar, MD      . piperacillin-tazobactam (ZOSYN) IVPB 3.375 g  3.375 g Intravenous Q12H Altamese DillingVachhani, Vaibhavkumar, MD 12.5 mL/hr at 11/29/17 1006 3.375 g at 11/29/17 1006  . ticagrelor (BRILINTA) tablet 60 mg  60 mg Oral BID Altamese DillingVachhani, Vaibhavkumar, MD      . timolol (TIMOPTIC) 0.5 % ophthalmic solution 1 drop  1 drop Left Eye BID Altamese DillingVachhani, Vaibhavkumar, MD      . traMADol Janean Sark(ULTRAM) tablet 50 mg  50 mg Oral TID PRN Altamese DillingVachhani, Vaibhavkumar, MD         Discharge Medications: Please see discharge summary for a list of discharge medications.  Relevant Imaging Results:  Relevant Lab Results:   Additional Information (SSN: [id number])  Gordie Belvin, Darleen CrockerBailey M, LCSW

## 2017-11-29 NOTE — Clinical Social Work Note (Addendum)
Clinical Social Work Assessment  Patient Details  Name: Nichole Hurst MRN: [id number] Date of Birth: October 01, 1951  Date of referral:  11/29/17               Reason for consult:  Facility Placement, Discharge Planning                Permission sought to share information with:  Chartered certified accountant granted to share information::  Yes, Verbal Permission Granted  Name::      [place]::   [place]  Relationship::     Contact Information:     Housing/Transportation Living arrangements for the past 2 months:  [place] of Information:  Patient, Siblings Patient Interpreter Needed:  None Criminal Activity/Legal Involvement Pertinent to Current Situation/Hospitalization:  No - Comment as needed Significant Relationships:  Adult Children, Other Family Members Lives with:  Self Do you feel safe going back to the place where you live?  Yes Need for family participation in patient care:  Yes (Comment)  Care giving concerns: Patient lives in [place] alone and is in the [facility] program.    Social Worker assessment / plan: Holiday representative (CSW) reviewed patient's chart and noted that she is a [facility] patient and having surgery today. Social work Theatre manager met with patient, daughter-in-law Nichole Hurst [phone]), and sister Nichole Hurst [phone]) after surgery today. Patient was laying down in bed alert and oriented x4. Social work Theatre manager introduced self and explained the role of the [place]. Patient shared that she lives in [place] alone and son Nichole Hurst [phone]) is her primary contact. Patient receives dialysis treatment at [facility] on Monday, Wednesday, and Friday. Social work Theatre manager explained that PT is pending but may recommend SNF and [facility]. Patient verbalized her understanding but prefers to go home. Patient's daughter-in-law and sister shared that pending PT  recommendations they will make a decision but prefers [facility]. Nichole with [facility] is aware of above. FL2 completed and faxed out. CSW and social work Theatre manager will continue to follow up and assist.  Employment status:  Retired Forensic scientist:  Managed Care PT Recommendations:  Not assessed at this time Information / Referral to community resources:  [facility], [place]  Patient/Family's Response to care: Patient wants to go home but family is agreeable to SNF placement pending PT.  Patient/Family's Understanding of and Emotional Response to Diagnosis, Current Treatment, and Prognosis: Patient and her family members were pleasant and thanked social work Theatre manager for her assistance.  Emotional Assessment Appearance:  Appears stated age Attitude/Demeanor/Rapport:    Affect (typically observed):  Calm, Pleasant, Accepting Orientation:  Oriented to Self, Oriented to Place, Oriented to  Time, Oriented to Situation Alcohol / Substance use:  Not Applicable Psych involvement (Current and /or in the community):  No (Comment)  Discharge Needs  Concerns to be addressed:  Care Coordination, Discharge Planning Concerns Readmission within the last 30 days:  No Current discharge risk:  Dependent with Mobility Barriers to Discharge:  Continued Medical Work up   Smith Mince, Student-Social Work 11/29/2017, 3:51 PM

## 2017-11-29 NOTE — Consult Note (Signed)
[facility] Infectious Disease     Reason for Consult: Gangrene, L great toe.    Referring Physician: Bettey Costa Date of Admission:  11/28/2017   Principal Problem:   Gangrene of toe of left foot ([place]) Active Problems:   Gangrene (HCC)   HPI: Nichole Hurst is a 67 y.o. female with hx coronary artery disease status post CABG, carotid stenosis, Charcoat's disease, diabetes, end-stage renal disease on hemodialysis, hypertension, hyperlipidemia now with progressive gangrene of L first toe with exposed bone. . She has been following with podiatry and with vascular for this. Most recent ABI 1.1. Plan was to allow toe to demarcate and then amputate.   Today she underwent amputation of L great toe.    Past Medical History:  Diagnosis Date  . Anemia of chronic disease   . CAD (coronary artery disease)    a. s/p 2-V CABG 05/2016 (LIMA-LAD, VG-OM1)  . Carotid stenosis   . Charcot's gait    FOOT  . Claustrophobia   . Colon polyp   . Diabetes mellitus with complication ([place])   . Emphysema/COPD ([place])   . ESRD (end stage renal disease) on dialysis ([place])    DIAYLISIS M/W/F  . Fractures    Lt foot  . GERD (gastroesophageal reflux disease)   . Glaucoma   . History of kidney stones   . Hyperlipidemia   . Hypertension   . Migraine   . Orthopnea   . Oxygen deficiency    AS NEEDED  . PAD (peripheral artery disease) ([place])   . Pain    JOINTS  . Presence of permanent cardiac pacemaker    St. Jude   . Retinopathy    DIABETIC  . Stroke [facility]) 09/2015   Ischemic stroke with R sided hemiparesis   Past Surgical History:  Procedure Laterality Date  . ABDOMINAL HYSTERECTOMY    . AV FISTULA PLACEMENT     LEFT ARM  . CATARACT EXTRACTION W/PHACO Right 08/25/2016   Procedure: CATARACT EXTRACTION PHACO AND INTRAOCULAR LENS PLACEMENT (IOC) AND ANTERIOR VITRECTOMY;  Surgeon: Estill Cotta, MD;  Location: [facility] ORS;  Service: Ophthalmology;  Laterality: Right;  FLUID CASSETTE [id number] H, EXP  01/05/18  . CHOLECYSTECTOMY    . CORONARY ARTERY BYPASS GRAFT     05/13/2016  . INSERT / REPLACE / REMOVE PACEMAKER     6/17  . INSERTION OF AHMED VALVE Left 08/04/2017   Procedure: INSERTION OF AHMED VALVE;  Surgeon: Leandrew Koyanagi, MD;  Location: [facility] ORS;  Service: Ophthalmology;  Laterality: Left;  . LOWER EXTREMITY ANGIOGRAPHY Left 06/13/2017   Procedure: Lower Extremity Angiography;  Surgeon: Algernon Huxley, MD;  Location: [place] CV LAB;  Service: Cardiovascular;  Laterality: Left;  . PACEMAKER IMPLANT    . PERIPHERAL VASCULAR CATHETERIZATION Left 04/08/2016   Procedure: A/V Shuntogram/Fistulagram;  Surgeon: Algernon Huxley, MD;  Location: [place] CV LAB;  Service: Cardiovascular;  Laterality: Left;  . PERIPHERAL VASCULAR CATHETERIZATION N/A 04/08/2016   Procedure: A/V Shunt Intervention;  Surgeon: Algernon Huxley, MD;  Location: [place] CV LAB;  Service: Cardiovascular;  Laterality: N/A;  . STONES     KIDNEY  . TONSILLECTOMY    . TUBAL LIGATION     Social History   Tobacco Use  . Smoking status: Former Smoker    Last attempt to quit: 06/10/1983    Years since quitting: 34.4  . Smokeless tobacco: Former Network engineer Use Topics  . Alcohol use: No  . Drug use: No  Family History  Problem Relation Age of Onset  . Diabetes Mother   . Hypertension Mother   . Heart attack Mother     Allergies:  Allergies  Allergen Reactions  . Colchicine Anaphylaxis  . Clopidogrel     Current antibiotics: Antibiotics Given (last 72 hours)    Date/Time Action Medication Dose Rate   11/28/17 2102 New Bag/Given   [MAR Hold] piperacillin-tazobactam (ZOSYN) IVPB 3.375 g (MAR Hold since 11/29/17 1235) 3.375 g 12.5 mL/hr   11/28/17 2102 New Bag/Given   vancomycin (VANCOCIN) 1,500 mg in sodium chloride 0.9 % 500 mL IVPB 1,500 mg 250 mL/hr   11/29/17 1006 New Bag/Given   [MAR Hold] piperacillin-tazobactam (ZOSYN) IVPB 3.375 g (MAR Hold since 11/29/17 1235) 3.375 g 12.5 mL/hr       MEDICATIONS: . [MAR Hold] aspirin EC  81 mg Oral QHS  . [MAR Hold] atorvastatin  80 mg Oral QPM  . [MAR Hold] calcium acetate  1,334 mg Oral TID WC  . [MAR Hold] Difluprednate  1 drop Left Eye Daily  . [MAR Hold] fluticasone  1 spray Each Nare Daily  . [MAR Hold] heparin  5,000 Units Subcutaneous Q8H  . [MAR Hold] insulin aspart  0-9 Units Subcutaneous TID WC  . [MAR Hold] insulin glargine  5 Units Subcutaneous Daily  . [MAR Hold] irbesartan  150 mg Oral QHS  . [MAR Hold] latanoprost  1 drop Both Eyes QHS  . [MAR Hold] metoprolol succinate  25 mg Oral Daily  . [MAR Hold] moxifloxacin  1 drop Left Eye QID  . [MAR Hold] ticagrelor  60 mg Oral BID  . [MAR Hold] timolol  1 drop Left Eye BID    Review of Systems - 11 systems reviewed and negative per HPI   OBJECTIVE: Temp:  [97.2 F (36.2 C)-98.6 F (37 C)] 97.5 F (36.4 C) (01/22 1402) Pulse Rate:  [66-105] 66 (01/22 1430) Resp:  [16-22] 20 (01/22 1430) BP: (104-154)/(46-69) 104/46 (01/22 1430) SpO2:  [76 %-100 %] 91 % (01/22 1430) Weight:  [64.9 kg (143 lb)] 64.9 kg (143 lb) (01/22 1237) Physical Exam  Constitutional:  oriented to person, place, and time. appears well-developed and well-nourished. No distress.  HENT: [place]/AT, PERRLA, no scleral icterus Mouth/Throat: Oropharynx is clear and moist. No oropharyngeal exudate.  Cardiovascular: Normal rate, regular rhythm and normal heart sounds. Pulmonary/Chest: Effort normal and breath sounds normal. No respiratory distress.  has no wheezes.  Neck = supple, no nuchal rigidity Abdominal: Soft. Bowel sounds are normal.  exhibits no distension. There is no tenderness.  Lymphadenopathy: no cervical adenopathy. No axillary adenopathy Neurological: alert and oriented to person, place, and time.  Skin: L foot wrapped post op Psychiatric: a normal mood and affect.  behavior is normal.    LABS: Results for orders placed or performed during the hospital encounter of 11/28/17 (from the  past 48 hour(s))  Glucose, capillary     Status: Abnormal   Collection Time: 11/28/17  4:52 PM  Result Value Ref Range   Glucose-Capillary 170 (H) 65 - 99 mg/dL  Surgical PCR screen     Status: None   Collection Time: 11/28/17  5:37 PM  Result Value Ref Range   MRSA, PCR NEGATIVE NEGATIVE   Staphylococcus aureus NEGATIVE NEGATIVE    Comment: (NOTE) The Xpert SA Assay (FDA approved for NASAL specimens in patients 31 years of age and older), is one component of a comprehensive surveillance program. It is not intended to diagnose infection nor to guide  or monitor treatment. Performed at [facility], [address] [place], [place] [address]   CBC     Status: Abnormal   Collection Time: 11/28/17  6:17 PM  Result Value Ref Range   WBC 7.4 3.6 - 11.0 K/uL   RBC 3.86 3.80 - 5.20 MIL/uL   Hemoglobin 11.0 (L) 12.0 - 16.0 g/dL   HCT 34.0 (L) 35.0 - 47.0 %   MCV 88.1 80.0 - 100.0 fL   MCH 28.6 26.0 - 34.0 pg   MCHC 32.4 32.0 - 36.0 g/dL   RDW 21.2 (H) 11.5 - 14.5 %   Platelets 310 150 - 440 K/uL    Comment: Performed at [facility], [address] [place], [place] [address]  Creatinine, serum     Status: Abnormal   Collection Time: 11/28/17  6:17 PM  Result Value Ref Range   Creatinine, Ser 4.20 (H) 0.44 - 1.00 mg/dL   GFR calc non Af Amer 10 (L) >60 mL/min   GFR calc Af Amer 12 (L) >60 mL/min    Comment: (NOTE) The eGFR has been calculated using the CKD EPI equation. This calculation has not been validated in all clinical situations. eGFR's persistently <60 mL/min signify possible Chronic Kidney Disease. Performed at [facility], [address] [place], [place] [address]   Glucose, capillary     Status: Abnormal   Collection Time: 11/28/17  9:10 PM  Result Value Ref Range   Glucose-Capillary 217 (H) 65 - 99 mg/dL  Basic metabolic panel     Status: Abnormal   Collection Time: 11/29/17  3:06 AM  Result Value Ref Range   Sodium 136 135 - 145  mmol/L   Potassium 4.0 3.5 - 5.1 mmol/L   Chloride 93 (L) 101 - 111 mmol/L   CO2 33 (H) 22 - 32 mmol/L   Glucose, Bld 228 (H) 65 - 99 mg/dL   BUN 22 (H) 6 - 20 mg/dL   Creatinine, Ser 5.34 (H) 0.44 - 1.00 mg/dL   Calcium 8.7 (L) 8.9 - 10.3 mg/dL   GFR calc non Af Amer 8 (L) >60 mL/min   GFR calc Af Amer 9 (L) >60 mL/min    Comment: (NOTE) The eGFR has been calculated using the CKD EPI equation. This calculation has not been validated in all clinical situations. eGFR's persistently <60 mL/min signify possible Chronic Kidney Disease.    Anion gap 10 5 - 15    Comment: Performed at [facility], [address] [place], [place] [address]  CBC     Status: Abnormal   Collection Time: 11/29/17  3:06 AM  Result Value Ref Range   WBC 6.7 3.6 - 11.0 K/uL   RBC 3.40 (L) 3.80 - 5.20 MIL/uL   Hemoglobin 9.5 (L) 12.0 - 16.0 g/dL   HCT 30.3 (L) 35.0 - 47.0 %   MCV 89.1 80.0 - 100.0 fL   MCH 27.8 26.0 - 34.0 pg   MCHC 31.2 (L) 32.0 - 36.0 g/dL   RDW 21.5 (H) 11.5 - 14.5 %   Platelets 264 150 - 440 K/uL    Comment: Performed at [facility], [address] [place], [place] [address]  Glucose, capillary     Status: Abnormal   Collection Time: 11/29/17  7:46 AM  Result Value Ref Range   Glucose-Capillary 182 (H) 65 - 99 mg/dL  Hemoglobin A1c     Status: Abnormal   Collection Time: 11/29/17  8:18 AM  Result Value  Ref Range   Hgb A1c MFr Bld 7.2 (H) 4.8 - 5.6 %    Comment: (NOTE) Pre diabetes:          5.7%-6.4% Diabetes:              >6.4% Glycemic control for   <7.0% adults with diabetes    Mean Plasma Glucose 159.94 mg/dL    Comment: Performed at [facility], [address] [place], [place] [address]  Glucose, capillary     Status: Abnormal   Collection Time: 11/29/17 11:47 AM  Result Value Ref Range   Glucose-Capillary 103 (H) 65 - 99 mg/dL  Glucose, capillary     Status: Abnormal   Collection Time: 11/29/17  2:03 PM  Result Value Ref Range    Glucose-Capillary 136 (H) 65 - 99 mg/dL   No components found for: ESR, C REACTIVE PROTEIN MICRO: Recent Results (from the past 720 hour(s))  Surgical PCR screen     Status: None   Collection Time: 11/28/17  5:37 PM  Result Value Ref Range Status   MRSA, PCR NEGATIVE NEGATIVE Final   Staphylococcus aureus NEGATIVE NEGATIVE Final    Comment: (NOTE) The Xpert SA Assay (FDA approved for NASAL specimens in patients 69 years of age and older), is one component of a comprehensive surveillance program. It is not intended to diagnose infection nor to guide or monitor treatment. Performed at [facility], [address] [place], [place] [address]     IMAGING: Dg Foot 2 Views Left  Result Date: 11/28/2017 CLINICAL DATA:  LEFT foot pain.  Gangrene. EXAM: LEFT FOOT - 2 VIEW COMPARISON:  None. FINDINGS: Lucency dorsum of the first distal phalanx, limited assessment on AP view due to patient positioning. Splayed first and second metatarsal with corticated bony fragment interposed contiguous with midfoot. A second bony fragment projects within the medial midfoot without donor site. Osteopenia. Severe vascular calcifications. Dorsal and forefoot soft tissue swelling without subcutaneous gas or radiopaque foreign bodies. IMPRESSION: Focal lucency dorsum first distal phalanx most consistent with focal osteomyelitis. 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 Electronically Signed   By: Elon Alas M.D.   On: 11/28/2017 19:33   US Abdomen Limited Ruq  Result Date: 11/10/2017 CLINICAL DATA:  Right upper quadrant pain . EXAM: ULTRASOUND ABDOMEN LIMITED RIGHT UPPER QUADRANT COMPARISON:  None. FINDINGS: Gallbladder: Cholecystectomy. Common bile duct: Diameter: 4.8 mm Liver: No focal lesion identified. Within normal limits in parenchymal echogenicity. Vascular calcification. Portal vein is patent on color Doppler  imaging with normal direction of blood flow towards the liver. IMPRESSION: 1. Cholecystectomy.  No biliary distention.  No acute abnormality. 2.  Vascular calcifications again noted in the liver . Electronically Signed   By: Marcello Moores  Register   On: 11/10/2017 11:39    Assessment:   Nichole Hurst is a 67 y.o. female admitted with gangrene of L great toe. She has a hx of PAD and has had revascularization with nml ABIs most recently in December. She has no abx allergies and was on augmentin for the last 5 days prior to admission.  Cultures are pending.  MRSA PCR negative.  Recommendations DC vanco since MRSA PCR negative.  Cont zosyn  Can likely dc on oral augmentin for a 14 day course and follow up on cultures Thank you very much for allowing me to participate in the care of this patient. Please call with questions.   Cheral Marker.  Ola Spurr, MD

## 2017-11-30 ENCOUNTER — Encounter: Payer: Self-pay | Admitting: Podiatry

## 2017-11-30 LAB — BASIC METABOLIC PANEL
Anion gap: 13 (ref 5–15)
BUN: 35 mg/dL — AB (ref 6–20)
CHLORIDE: 92 mmol/L — AB (ref 101–111)
CO2: 31 mmol/L (ref 22–32)
CREATININE: 6.84 mg/dL — AB (ref 0.44–1.00)
Calcium: 9 mg/dL (ref 8.9–10.3)
GFR calc Af Amer: 7 mL/min — ABNORMAL LOW (ref >60)
GFR calc non Af Amer: 6 mL/min — ABNORMAL LOW (ref >60)
GLUCOSE: 172 mg/dL — AB (ref 65–99)
POTASSIUM: 4.2 mmol/L (ref 3.5–5.1)
SODIUM: 136 mmol/L (ref 135–145)

## 2017-11-30 LAB — CBC
HCT: 30.8 % — ABNORMAL LOW (ref 35.0–47.0)
HEMOGLOBIN: 9.8 g/dL — AB (ref 12.0–16.0)
MCH: 28.1 pg (ref 26.0–34.0)
MCHC: 31.8 g/dL — AB (ref 32.0–36.0)
MCV: 88.5 fL (ref 80.0–100.0)
Platelets: 278 10*3/uL (ref 150–440)
RBC: 3.48 MIL/uL — AB (ref 3.80–5.20)
RDW: 21.9 % — ABNORMAL HIGH (ref 11.5–14.5)
WBC: 5.6 10*3/uL (ref 3.6–11.0)

## 2017-11-30 LAB — GLUCOSE, CAPILLARY
GLUCOSE-CAPILLARY: 155 mg/dL — AB (ref 65–99)
Glucose-Capillary: 180 mg/dL — ABNORMAL HIGH (ref 65–99)
Glucose-Capillary: 142 mg/dL — ABNORMAL HIGH (ref 65–99)

## 2017-11-30 MED ORDER — MORPHINE SULFATE (PF) 2 MG/ML IV SOLN
2.0000 mg | INTRAVENOUS | Status: DC | PRN
  Administered 2017-11-30: 2 mg via INTRAVENOUS
  Filled 2017-11-30: qty 1

## 2017-11-30 MED ORDER — SODIUM CHLORIDE 0.9 % IV SOLN: 100.0000 mL | INTRAVENOUS | Status: DC | PRN

## 2017-11-30 MED ORDER — LIDOCAINE HCL (PF) 1 % IJ SOLN
5.0000 mL | INTRAMUSCULAR | Status: DC | PRN
  Filled 2017-11-30: qty 5

## 2017-11-30 MED ORDER — LIDOCAINE-PRILOCAINE 2.5-2.5 % EX CREA
1.0000 "application " | TOPICAL_CREAM | CUTANEOUS | Status: DC | PRN
  Filled 2017-11-30: qty 5

## 2017-11-30 MED ORDER — HYDROCODONE-ACETAMINOPHEN 5-325 MG PO TABS: 1.0000 | ORAL_TABLET | ORAL | 0 refills | Status: DC | PRN

## 2017-11-30 MED ORDER — AMOXICILLIN-POT CLAVULANATE 500-125 MG PO TABS
500.0000 mg | ORAL_TABLET | ORAL | Status: DC
  Administered 2017-11-30: 500 mg via ORAL
  Filled 2017-11-30: qty 1

## 2017-11-30 MED ORDER — ALTEPLASE 2 MG IJ SOLR: 2.0000 mg | Freq: Once | INTRAMUSCULAR | Status: DC | PRN

## 2017-11-30 MED ORDER — HEPARIN SODIUM (PORCINE) 1000 UNIT/ML DIALYSIS: 1000.0000 [IU] | INTRAMUSCULAR | Status: DC | PRN

## 2017-11-30 MED ORDER — PENTAFLUOROPROP-TETRAFLUOROETH EX AERO
1.0000 "application " | INHALATION_SPRAY | CUTANEOUS | Status: DC | PRN
  Filled 2017-11-30: qty 30

## 2017-11-30 MED ORDER — SODIUM CHLORIDE 0.9 % IV SOLN
100.0000 mL | INTRAVENOUS | Status: DC | PRN
Start: 2017-11-30 — End: 2017-11-30

## 2017-11-30 NOTE — Progress Notes (Signed)
Pt arrived back to unit from dialysis.

## 2017-11-30 NOTE — Progress Notes (Signed)
[facility]  ROUNDING NOTE   Subjective:  Patient seen and evaluated at bedside. She is due for hemodialysis today. Orders have been prepared.    Objective:  Vital signs in last 24 hours:  Temp:  [96.8 F (36 C)-98.7 F (37.1 C)] 97.8 F (36.6 C) (01/23 0813) Pulse Rate:  [64-90] 90 (01/23 0813) Resp:  [14-22] 16 (01/23 0813) BP: (93-157)/(41-72) 157/69 (01/23 0813) SpO2:  [91 %-100 %] 96 % (01/23 0813) Weight:  [64.9 kg (143 lb)-70.3 kg (154 lb 14.4 oz)] 70.3 kg (154 lb 14.4 oz) (01/23 0526)  Weight change: 0 kg (0 lb) Filed Weights   11/28/17 1741 11/29/17 1237 11/30/17 0526  Weight: 64.9 kg (143 lb) 64.9 kg (143 lb) 70.3 kg (154 lb 14.4 oz)    Intake/Output: I/O last 3 completed shifts: In: 540 [P.O.:240; I.V.:200; IV Piggyback:100] Out: 0    Intake/Output this shift:  Total I/O In: 120 [P.O.:120] Out: -   Physical Exam: General: No acute distress  Head: Normocephalic, atraumatic. Moist oral mucosal membranes  Eyes: Anicteric  Neck: Supple, trachea midline  Lungs:  Clear to auscultation, normal effort  Heart: S1S2 no rubs  Abdomen:  Soft, nontender, bowel sounds present  Extremities: Trace peripheral edema.  Neurologic: Awake, alert, following commands  Skin: No lesions  Access: Left upper extremity AV fistula    Basic Metabolic Panel: Recent Labs  Lab 11/28/17 1817 11/29/17 0306 11/30/17 0352  NA  --  136 136  K  --  4.0 4.2  CL  --  93* 92*  CO2  --  33* 31  GLUCOSE  --  228* 172*  BUN  --  22* 35*  CREATININE 4.20* 5.34* 6.84*  CALCIUM  --  8.7* 9.0    Liver Function Tests: No results for input(s): AST, ALT, ALKPHOS, BILITOT, PROT, ALBUMIN in the last 168 hours. No results for input(s): LIPASE, AMYLASE in the last 168 hours. No results for input(s): AMMONIA in the last 168 hours.  CBC: Recent Labs  Lab 11/28/17 1817 11/29/17 0306 11/30/17 0352  WBC 7.4 6.7 5.6  HGB 11.0* 9.5* 9.8*  HCT 34.0* 30.3* 30.8*  MCV 88.1  89.1 88.5  PLT 310 264 278    Cardiac Enzymes: No results for input(s): CKTOTAL, CKMB, CKMBINDEX, TROPONINI in the last 168 hours.  BNP: Invalid input(s): POCBNP  CBG: Recent Labs  Lab 11/29/17 1147 11/29/17 1403 11/29/17 1648 11/29/17 2106 11/30/17 0814  GLUCAP 103* 136* 146* 224* 180*    Microbiology: Results for orders placed or performed during the hospital encounter of 11/28/17  Surgical PCR screen     Status: None   Collection Time: 11/28/17  5:37 PM  Result Value Ref Range Status   MRSA, PCR NEGATIVE NEGATIVE Final   Staphylococcus aureus NEGATIVE NEGATIVE Final    Comment: (NOTE) The Xpert SA Assay (FDA approved for NASAL specimens in patients 24 years of age and older), is one component of a comprehensive surveillance program. It is not intended to diagnose infection nor to guide or monitor treatment. Performed at [facility] [address] [place], [place] [address]   Aerobic/Anaerobic Culture (surgical/deep wound)     Status: None (Preliminary result)   Collection Time: 11/29/17  1:33 PM  Result Value Ref Range Status   Specimen Description   Final    BONE LEFT TOE GREAT TOE Performed at [facility], [address] [place], [place] [address]    Special Requests   Final  NONE Performed at [facility], [address] [place], [place] [address]    Gram Stain   Final    FEW WBC PRESENT, PREDOMINANTLY PMN RARE GRAM POSITIVE COCCI    Culture PENDING  Incomplete   Report Status PENDING  Incomplete    Coagulation Studies: No results for input(s): LABPROT, INR in the last 72 hours.  Urinalysis: No results for input(s): COLORURINE, LABSPEC, PHURINE, GLUCOSEU, HGBUR, BILIRUBINUR, KETONESUR, PROTEINUR, UROBILINOGEN, NITRITE, LEUKOCYTESUR in the last 72 hours.  Invalid input(s): APPERANCEUR    Imaging: Dg Foot 2 Views Left  Result Date: 11/28/2017 CLINICAL DATA:  LEFT foot pain.  Gangrene. EXAM: LEFT FOOT - 2 VIEW  COMPARISON:  None. FINDINGS: Lucency dorsum of the first distal phalanx, limited assessment on AP view due to patient positioning. Splayed first and second metatarsal with corticated bony fragment interposed contiguous with midfoot. A second bony fragment projects within the medial midfoot without donor site. Osteopenia. Severe vascular calcifications. Dorsal and forefoot soft tissue swelling without subcutaneous gas or radiopaque foreign bodies. IMPRESSION: Focal lucency dorsum first distal phalanx most consistent with focal osteomyelitis. 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 Electronically Signed   By: Awilda Metroourtnay  Bloomer M.D.   On: 11/28/2017 19:33     Medications:    . amoxicillin-clavulanate  500 mg of amoxicillin Oral Q24H  . aspirin EC  81 mg Oral QHS  . atorvastatin  80 mg Oral QPM  . calcium acetate  1,334 mg Oral TID WC  . Difluprednate  1 drop Left Eye Daily  . fluticasone  1 spray Each Nare Daily  . heparin  5,000 Units Subcutaneous Q8H  . insulin aspart  0-9 Units Subcutaneous TID WC  . insulin glargine  5 Units Subcutaneous Daily  . irbesartan  150 mg Oral QHS  . latanoprost  1 drop Both Eyes QHS  . metoprolol succinate  25 mg Oral Daily  . moxifloxacin  1 drop Left Eye QID  . ticagrelor  60 mg Oral BID  . timolol  1 drop Left Eye BID   docusate sodium, HYDROcodone-acetaminophen, ipratropium-albuterol, morphine injection, nitroGLYCERIN, ondansetron, traMADol  Assessment/ Plan:  67 y.o. female with ESRD on HD, [facility] [address], MWF,Stroke 2016, residual rt sided weakness improved now, CABG- [facility], 2017 , Pacemaker, COPD, Left foot fracture, Left eye surgery Friday, was admitted on January 21 left great toe osteomyelitis.  [facility] [address]  1.  ESRD on HD MWF: Patient seen at bedside, due for dialysis today, UF target 2.5kg.   2.  Anemia of chronic kidney disease.  Continue mircera  as an outpt.   3.  Secondary hyperparathyroidism.  Check phos/ipth with HD today.   4.  Hypertension.  Blood pressure currently 157/69.  Continue metoprolol and irbesartan.       LOS: 2 Nichole Hurst 1/23/201912:07 PM

## 2017-11-30 NOTE — Progress Notes (Signed)
Discharge note;  Discharge instructions and prescription given to pt. IV removed. No questions from pt at this time. Will assist pt to get dressed and will be ready for discharge home.

## 2017-11-30 NOTE — Progress Notes (Signed)
Pre HD Tx  

## 2017-11-30 NOTE — Progress Notes (Signed)
HD Tx Start  

## 2017-11-30 NOTE — Progress Notes (Signed)
Pre HD Assessment  

## 2017-11-30 NOTE — Discharge Summary (Signed)
[facility] [place] [facility]   PATIENT NAME: Nichole EvensGloria Takach    MR#:  [id number]  DATE OF BIRTH:  10/03/1951  DATE OF ADMISSION:  11/28/2017 ADMITTING PHYSICIAN: Bertrum SolMontell D Salary, MD  DATE OF DISCHARGE: 11/30/2017   PRIMARY CARE PHYSICIAN: System, Pcp Not In    ADMISSION DIAGNOSIS:  lt great toe gangrene  DISCHARGE DIAGNOSIS:  Principal Problem:   Gangrene of toe of left foot (HCC) Active Problems:   Gangrene (HCC)   SECONDARY DIAGNOSIS:   Past Medical History:  Diagnosis Date  . Anemia of chronic disease   . CAD (coronary artery disease)    a. s/p 2-V CABG 05/2016 (LIMA-LAD, VG-OM1)  . Carotid stenosis   . Charcot's gait    FOOT  . Claustrophobia   . Colon polyp   . Diabetes mellitus with complication (HCC)   . Emphysema/COPD (HCC)   . ESRD (end stage renal disease) on dialysis (HCC)    DIAYLISIS M/W/F  . Fractures    Lt foot  . GERD (gastroesophageal reflux disease)   . Glaucoma   . History of kidney stones   . Hyperlipidemia   . Hypertension   . Migraine   . Orthopnea   . Oxygen deficiency    AS NEEDED  . PAD (peripheral artery disease) (HCC)   . Pain    JOINTS  . Presence of permanent cardiac pacemaker    St. Jude   . Retinopathy    DIABETIC  . Stroke [facility](HCC) 09/2015   Ischemic stroke with R sided hemiparesis    HOSPITAL COURSE:   67 year old female with end-stage renal disease on hemodialysis, CAD, diabetes and peripheral vascular disease Who is admitted from the podiatry office for amputation of the left great toe due to wet gangrene and osteomyelitis.   1. Wet gangrene/osteomyelitis left great toe: s/p amputation of left great toe  Given Zosyn and vancomycin ID consultation appreciated Further management as per podiatry  plan for augmentin oral on d/c and follow in podiatry clinic.  2. End-stage renal disease on hematemesis: Nephrology consultation requested for dialysis Monday, Wednesday and Friday.  3.  CAD: Continue metoprolol,Brillanta, aspirin and atorvastatin  4. Essential hypertension: Continue Avapro and metoprolol  5. Diabetes: Continue sliding scale Diabetes nurse consultation requested Check A1c- 7.2  Diet restrictions advised.  6. Peripheral vascular disease: Continue statin and aspirin    DISCHARGE CONDITIONS:   Stable.  CONSULTS OBTAINED:  Treatment Team:  Linus Galasline, Todd, DPM Mady HaagensenLateef, Munsoor, MD Mick SellFitzgerald, David P, MD  DRUG ALLERGIES:   Allergies  Allergen Reactions  . Colchicine Anaphylaxis  . Clopidogrel     DISCHARGE MEDICATIONS:   Allergies as of 11/30/2017      Reactions   Colchicine Anaphylaxis   Clopidogrel       Medication List    TAKE these medications   amoxicillin-clavulanate 500-125 MG tablet Commonly known as:  AUGMENTIN Take 1 tablet by mouth 3 (three) times daily. Take 1 tablet by mouth once a day and administer additional dose by mouth during and at end of dialysis   aspirin EC 81 MG tablet Take 81 mg by mouth at bedtime.   atorvastatin 80 MG tablet Commonly known as:  LIPITOR Take 80 mg by mouth every evening.   bimatoprost 0.03 % ophthalmic solution Commonly known as:  LUMIGAN Place 1 drop into the right eye at bedtime.   Brinzolamide-Brimonidine 1-0.2 % Susp Apply 1 drop to eye 3 (three) times daily. LEFT EYE   calcium  acetate 667 MG capsule Commonly known as:  PHOSLO Take 1,334 mg by mouth 3 (three) times daily with meals.   diclofenac sodium 1 % Gel Commonly known as:  VOLTAREN Apply topically 4 (four) times daily.   DUREZOL 0.05 % Emul Generic drug:  Difluprednate Place 1 drop into the left eye daily.   Ferrous Sulfate 140 (45 Fe) MG Tbcr Take 1 tablet by mouth daily.   FLONASE NA Place 2 puffs into the nose 2 (two) times daily as needed.   HYDROcodone-acetaminophen 5-325 MG tablet Commonly known as:  NORCO/VICODIN Take 1 tablet by mouth every 4 (four) hours as needed for moderate pain.    Ipratropium-Albuterol 20-100 MCG/ACT Aers respimat Commonly known as:  COMBIVENT Inhale 2 puffs into the lungs every 6 (six) hours.   irbesartan 150 MG tablet Commonly known as:  AVAPRO Take 1 tablet (150 mg total) by mouth at bedtime.   loperamide 2 MG capsule Commonly known as:  IMODIUM Take by mouth as needed for diarrhea or loose stools.   losartan 100 MG tablet Commonly known as:  COZAAR Take 100 mg by mouth at bedtime.   metoprolol succinate 25 MG 24 hr tablet Commonly known as:  TOPROL-XL Take 25 mg by mouth every evening. What changed:  Another medication with the same name was removed. Continue taking this medication, and follow the directions you see here.   moxifloxacin 0.5 % ophthalmic solution Commonly known as:  VIGAMOX Place 1 drop into the left eye 4 (four) times daily.   nitroGLYCERIN 0.4 MG SL tablet Commonly known as:  NITROSTAT Place 0.4 mg under the tongue every 5 (five) minutes as needed for chest pain.   ondansetron 4 MG tablet Commonly known as:  ZOFRAN Take 4 mg by mouth every 8 (eight) hours as needed for nausea or vomiting.   ticagrelor 60 MG Tabs tablet Commonly known as:  BRILINTA Take 60 mg by mouth 2 (two) times daily.   timolol 0.5 % ophthalmic solution Commonly known as:  BETIMOL Place 1 drop into the left eye 2 (two) times daily.   traMADol 50 MG tablet Commonly known as:  ULTRAM Take 1 tablet (50 mg total) by mouth 3 (three) times daily as needed for moderate pain or severe pain.   Vitamin D (Ergocalciferol) 50000 units Caps capsule Commonly known as:  DRISDOL Take 50,000 Units by mouth every 30 (thirty) days.        DISCHARGE INSTRUCTIONS:    Follow with podiatry clinic in 1 week.  If you experience worsening of your admission symptoms, develop shortness of breath, life threatening emergency, suicidal or homicidal thoughts you must seek medical attention immediately by calling 911 or calling your MD immediately  if  symptoms less severe.  You Must read complete instructions/literature along with all the possible adverse reactions/side effects for all the Medicines you take and that have been prescribed to you. Take any new Medicines after you have completely understood and accept all the possible adverse reactions/side effects.   Please note  You were cared for by a hospitalist during your hospital stay. If you have any questions about your discharge medications or the care you received while you were in the hospital after you are discharged, you can call the unit and asked to speak with the hospitalist on call if the hospitalist that took care of you is not available. Once you are discharged, your primary care physician will handle any further medical issues. Please note that NO REFILLS for  any discharge medications will be authorized once you are discharged, as it is imperative that you return to your primary care physician (or establish a relationship with a primary care physician if you do not have one) for your aftercare needs so that they can reassess your need for medications and monitor your lab values.    Today   CHIEF COMPLAINT:  No chief complaint on file.   HISTORY OF PRESENT ILLNESS:  Nichole Hurst  is a 67 y.o. female with a known history of coronary artery disease status post CABG, carotid stenosis, Charcoat's disease, diabetes, end-stage renal disease on hemodialysis, hypertension, hyperlipidemia- had small gangrenous area at the tip of her left first toe since last few months which was dry gangrene and she was following with podiatry in the office. But now it started hurting more and today during the visit podiatrist noted it is wet gangrene now. As per him it needed urgent attention and amputation tomorrow so he suggested to get direct admitted to the hospital and called in. Patient denies any fever or chills. She has noted more swelling on her foot for last few days.   VITAL SIGNS:  Blood  pressure (!) 157/69, pulse 90, temperature 97.8 F (36.6 C), temperature source Oral, resp. rate 16, height 5\' 1"  (1.549 m), weight 70.3 kg (154 lb 14.4 oz), SpO2 96 %.  I/O:    Intake/Output Summary (Last 24 hours) at 11/30/2017 0837 Last data filed at 11/30/2017 0219 Gross per 24 hour  Intake 540 ml  Output 0 ml  Net 540 ml    PHYSICAL EXAMINATION:  GENERAL:  67 y.o.-year-old patient lying in the bed with no acute distress.  EYES: Pupils equal, round, reactive to light and accommodation. No scleral icterus. Extraocular muscles intact.  HEENT: Head atraumatic, normocephalic. Oropharynx and nasopharynx clear.  NECK:  Supple, no jugular venous distention. No thyroid enlargement, no tenderness.  LUNGS: Normal breath sounds bilaterally, no wheezing, rales,rhonchi or crepitation. No use of accessory muscles of respiration.  CARDIOVASCULAR: S1, S2 normal. No murmurs, rubs, or gallops.  ABDOMEN: Soft, non-tender, non-distended. Bowel sounds present. No organomegaly or mass.  EXTREMITIES: No pedal edema, cyanosis, or clubbing. Left foot I dressing, s/p 1st toe amputation. NEUROLOGIC: Cranial nerves II through XII are intact. Muscle strength 5/5 in all extremities. Sensation intact. Gait not checked.  PSYCHIATRIC: The patient is alert and oriented x 3.  SKIN: No obvious rash, lesion, or ulcer.   DATA REVIEW:   CBC Recent Labs  Lab 11/30/17 0352  WBC 5.6  HGB 9.8*  HCT 30.8*  PLT 278    Chemistries  Recent Labs  Lab 11/30/17 0352  NA 136  K 4.2  CL 92*  CO2 31  GLUCOSE 172*  BUN 35*  CREATININE 6.84*  CALCIUM 9.0    Cardiac Enzymes No results for input(s): TROPONINI in the last 168 hours.  Microbiology Results  Results for orders placed or performed during the hospital encounter of 11/28/17  Surgical PCR screen     Status: None   Collection Time: 11/28/17  5:37 PM  Result Value Ref Range Status   MRSA, PCR NEGATIVE NEGATIVE Final   Staphylococcus aureus NEGATIVE  NEGATIVE Final    Comment: (NOTE) The Xpert SA Assay (FDA approved for NASAL specimens in patients 57 years of age and older), is one component of a comprehensive surveillance program. It is not intended to diagnose infection nor to guide or monitor treatment. Performed at [facility], [address]  [place], [place] [address]   Aerobic/Anaerobic Culture (surgical/deep wound)     Status: None (Preliminary result)   Collection Time: 11/29/17  1:33 PM  Result Value Ref Range Status   Specimen Description   Final    BONE LEFT TOE GREAT TOE Performed at [facility], [address] [place], [place] [address]    Special Requests   Final    NONE Performed at [facility], [address] [place], [place] [address]    Gram Stain   Final    FEW WBC PRESENT, PREDOMINANTLY PMN RARE GRAM POSITIVE COCCI    Culture PENDING  Incomplete   Report Status PENDING  Incomplete    RADIOLOGY:  Dg Foot 2 Views Left  Result Date: 11/28/2017 CLINICAL DATA:  LEFT foot pain.  Gangrene. EXAM: LEFT FOOT - 2 VIEW COMPARISON:  None. FINDINGS: Lucency dorsum of the first distal phalanx, limited assessment on AP view due to patient positioning. Splayed first and second metatarsal with corticated bony fragment interposed contiguous with midfoot. A second bony fragment projects within the medial midfoot without donor site. Osteopenia. Severe vascular calcifications. Dorsal and forefoot soft tissue swelling without subcutaneous gas or radiopaque foreign bodies. IMPRESSION: Focal lucency dorsum first distal phalanx most consistent with focal osteomyelitis. 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 Electronically Signed   By: Awilda Metro M.D.   On: 11/28/2017 19:33    EKG:   Orders placed or performed during the hospital encounter of 08/08/17  . ED EKG  . ED EKG  . EKG 12-Lead  . EKG 12-Lead       Management plans discussed with the patient, family and they are in agreement.  CODE STATUS:     Code Status Orders  (From admission, onward)        Start     Ordered   11/28/17 1727  Full code  Continuous     11/28/17 1726    Code Status History    Date Active Date Inactive Code Status Order ID Comments User Context   08/08/2017 10:36 08/09/2017 18:55 Full Code [id number]  Enedina Finner, MD Inpatient   06/13/2017 11:00 06/13/2017 16:02 Full Code [id number]  Annice Needy, MD Inpatient      TOTAL TIME TAKING CARE OF THIS PATIENT: 40 minutes.    Altamese Dilling M.D on 11/30/2017 at 8:37 AM  Between 7am to 6pm - Pager - [phone]  After 6pm go to [email or web address] - password [email or web address]  [facility]  Office  [phone]  CC: Primary care physician; System, Pcp Not In   Note: This dictation was prepared with Dragon dictation along with smaller phrase technology. Any transcriptional errors that result from this process are unintentional.

## 2017-11-30 NOTE — Progress Notes (Signed)
PHARMACY NOTE:  ANTIMICROBIAL RENAL DOSAGE ADJUSTMENT  Current antimicrobial regimen includes a mismatch between antimicrobial dosage and estimated renal function.  As per policy approved by the Pharmacy & Therapeutics and Medical Executive Committees, the antimicrobial dosage will be adjusted accordingly.  Current antimicrobial dosage:  Amoxicillin-Clavulanate 875-125mg  PO Q12H.   Renal Function:  Estimated Creatinine Clearance: 7.3 mL/min (A) (by C-G formula based on SCr of 6.84 mg/dL (H)). []      On intermittent HD, scheduled: []      On CRRT    Antimicrobial dosage has been changed to:  Amoxicillin-Clavulanate 500-125mg  PO Q24H.   Thank you for allowing pharmacy to be a part of this patient's care.  Stormy CardKatsoudas,Nillie Bartolotta K, [facility]PH 11/30/2017 8:19 AM

## 2017-11-30 NOTE — Progress Notes (Signed)
1 Day Post-Op   Subjective/Chief Complaint: Patient seen.  Some pain in the left foot overnight.   Objective: Vital signs in last 24 hours: Temp:  [96.8 F (36 C)-98.7 F (37.1 C)] 97.4 F (36.3 C) (01/23 0507) Pulse Rate:  [64-70] 64 (01/23 0507) Resp:  [14-22] 19 (01/23 0507) BP: (93-154)/(41-72) 135/64 (01/23 0507) SpO2:  [91 %-100 %] 95 % (01/23 0507) Weight:  [64.9 kg (143 lb)-70.3 kg (154 lb 14.4 oz)] 70.3 kg (154 lb 14.4 oz) (01/23 0526) Last BM Date: 11/29/17  Intake/Output from previous day: 01/22 0701 - 01/23 0700 In: 540 [P.O.:240; I.V.:200; IV Piggyback:100] Out: 0  Intake/Output this shift: No intake/output data recorded.  The bandage on the left foot is dry and intact.  No evidence of any strikethrough on the bandaging.  Lab Results:  Recent Labs    11/29/17 0306 11/30/17 0352  WBC 6.7 5.6  HGB 9.5* 9.8*  HCT 30.3* 30.8*  PLT 264 278   BMET Recent Labs    11/29/17 0306 11/30/17 0352  NA 136 136  K 4.0 4.2  CL 93* 92*  CO2 33* 31  GLUCOSE 228* 172*  BUN 22* 35*  CREATININE 5.34* 6.84*  CALCIUM 8.7* 9.0   PT/INR No results for input(s): LABPROT, INR in the last 72 hours. ABG No results for input(s): PHART, HCO3 in the last 72 hours.  Invalid input(s): PCO2, PO2  Studies/Results: Dg Foot 2 Views Left  Result Date: 11/28/2017 CLINICAL DATA:  LEFT foot pain.  Gangrene. EXAM: LEFT FOOT - 2 VIEW COMPARISON:  None. FINDINGS: Lucency dorsum of the first distal phalanx, limited assessment on AP view due to patient positioning. Splayed first and second metatarsal with corticated bony fragment interposed contiguous with midfoot. A second bony fragment projects within the medial midfoot without donor site. Osteopenia. Severe vascular calcifications. Dorsal and forefoot soft tissue swelling without subcutaneous gas or radiopaque foreign bodies. IMPRESSION: Focal lucency dorsum first distal phalanx most consistent with focal osteomyelitis. 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 Electronically Signed   By: Awilda Metroourtnay  Bloomer M.D.   On: 11/28/2017 19:33    Anti-infectives: Anti-infectives (From admission, onward)   Start     Dose/Rate Route Frequency Ordered Stop   11/30/17 1200  vancomycin (VANCOCIN) IVPB 750 mg/150 ml premix  Status:  Discontinued     750 mg 150 mL/hr over 60 Minutes Intravenous Every M-W-F (Hemodialysis) 11/28/17 1919 11/29/17 1526   11/28/17 1845  vancomycin (VANCOCIN) 1,500 mg in sodium chloride 0.9 % 500 mL IVPB     1,500 mg 250 mL/hr over 120 Minutes Intravenous  Once 11/28/17 1837 11/28/17 2303   11/28/17 1830  piperacillin-tazobactam (ZOSYN) IVPB 3.375 g     3.375 g 12.5 mL/hr over 240 Minutes Intravenous Every 12 hours 11/28/17 1739        Assessment/Plan: s/p Procedure(s): AMPUTATION TOE-LEFT GREAT TOE (Left) Assessment: Stable status post amputation left great toe.   Plan: Ace wrap was rewrapped a little looser on the left foot.  Patient should be stable for discharge today and will resume her amoxicillin.  Recommend Norco for pain management.  Plan for follow-up in 1 week outpatient  LOS: 2 days    Ricci Barkerodd W Daysia Vandenboom 11/30/2017

## 2017-11-30 NOTE — Anesthesia Postprocedure Evaluation (Signed)
Anesthesia Post Note  Patient: Nichole Hurst  Procedure(s) Performed: AMPUTATION TOE-LEFT GREAT TOE (Left )  Patient location during evaluation: PACU Anesthesia Type: General Level of consciousness: awake and alert and oriented Pain management: pain level controlled Vital Signs Assessment: post-procedure vital signs reviewed and stable Respiratory status: spontaneous breathing, nonlabored ventilation and respiratory function stable Cardiovascular status: blood pressure returned to baseline and stable Postop Assessment: no signs of nausea or vomiting Anesthetic complications: no     Last Vitals:  Vitals:   11/29/17 2344 11/30/17 0507  BP: 114/72 135/64  Pulse: 65 64  Resp: 18 19  Temp: 36.5 C (!) 36.3 C  SpO2: 97% 95%    Last Pain:  Vitals:   11/30/17 0637  TempSrc:   PainSc: 2                  Taniya Dasher

## 2017-11-30 NOTE — Progress Notes (Signed)
Patient will D/C home today after dialysis per MD order. Per patient her sister will transport her home today. Sister is at bedside. [name] with [facility] is aware of above. Please reconsult if future social work needs arise. CSW signing off.   Baker Hughes IncorporatedBailey Harrington Jobe, LCSW [phone]

## 2017-11-30 NOTE — Progress Notes (Signed)
HD Tx end  

## 2017-11-30 NOTE — Progress Notes (Signed)
Report given to dialysis. Will be coming to get pt soon for HD.

## 2017-11-30 NOTE — Progress Notes (Signed)
Post HD Tx  

## 2017-11-30 NOTE — Progress Notes (Signed)
[facility] Date of Admission:  11/28/2017     ID: Sheppard EvensGloria Danielsen is a 67 y.o. female with  gangrene Principal Problem:   Gangrene of toe of left foot (HCC) Active Problems:   Gangrene (HCC)   Subjective: NO fevers, foot stable.    ROS  Eleven systems are reviewed and negative except per hpi  Medications:  Antibiotics Given (last 72 hours)    Date/Time Action Medication Dose Rate   11/28/17 2102 New Bag/Given   piperacillin-tazobactam (ZOSYN) IVPB 3.375 g 3.375 g 12.5 mL/hr   11/28/17 2102 New Bag/Given   vancomycin (VANCOCIN) 1,500 mg in sodium chloride 0.9 % 500 mL IVPB 1,500 mg 250 mL/hr   11/29/17 1006 New Bag/Given   piperacillin-tazobactam (ZOSYN) IVPB 3.375 g 3.375 g 12.5 mL/hr   11/29/17 2219 New Bag/Given   piperacillin-tazobactam (ZOSYN) IVPB 3.375 g 3.375 g 12.5 mL/hr     . amoxicillin-clavulanate  500 mg of amoxicillin Oral Q24H  . aspirin EC  81 mg Oral QHS  . atorvastatin  80 mg Oral QPM  . calcium acetate  1,334 mg Oral TID WC  . Difluprednate  1 drop Left Eye Daily  . fluticasone  1 spray Each Nare Daily  . heparin  5,000 Units Subcutaneous Q8H  . insulin aspart  0-9 Units Subcutaneous TID WC  . insulin glargine  5 Units Subcutaneous Daily  . irbesartan  150 mg Oral QHS  . latanoprost  1 drop Both Eyes QHS  . metoprolol succinate  25 mg Oral Daily  . moxifloxacin  1 drop Left Eye QID  . ticagrelor  60 mg Oral BID  . timolol  1 drop Left Eye BID    Objective: Vital signs in last 24 hours: Temp:  [97.4 F (36.3 C)-98.7 F (37.1 C)] 98.1 F (36.7 C) (01/23 1300) Pulse Rate:  [64-90] 90 (01/23 0813) Resp:  [16-19] 16 (01/23 0813) BP: (93-157)/(56-72) 157/69 (01/23 0813) SpO2:  [95 %-99 %] 99 % (01/23 1300) Weight:  [70.3 kg (154 lb 14.4 oz)-70.3 kg (154 lb 15.7 oz)] 70.3 kg (154 lb 15.7 oz) (01/23 1300)   Lab Results Recent Labs    11/29/17 0306 11/30/17 0352  WBC 6.7 5.6  HGB 9.5* 9.8*  HCT 30.3* 30.8*   NA 136 136  K 4.0 4.2  CL 93* 92*  CO2 33* 31  BUN 22* 35*  CREATININE 5.34* 6.84*    Microbiology: Constitutional:  oriented to person, place, and time. appears well-developed and well-nourished. No distress.  HENT: [place]/AT, PERRLA, no scleral icterus Mouth/Throat: Oropharynx is clear and moist. No oropharyngeal exudate.  Cardiovascular: Normal rate, regular rhythm and normal heart sounds. Pulmonary/Chest: Effort normal and breath sounds normal. No respiratory distress.  has no wheezes.  Neck = supple, no nuchal rigidity Abdominal: Soft. Bowel sounds are normal.  exhibits no distension. There is no tenderness.  Lymphadenopathy: no cervical adenopathy. No axillary adenopathy Neurological: alert and oriented to person, place, and time.  Skin: L foot wrapped post op Psychiatric: a normal mood and affect.  behavior is normal.    Studies/Results: Dg Foot 2 Views Left  Result Date: 11/28/2017 CLINICAL DATA:  LEFT foot pain.  Gangrene. EXAM: LEFT FOOT - 2 VIEW COMPARISON:  None. FINDINGS: Lucency dorsum of the first distal phalanx, limited assessment on AP view due to patient positioning. Splayed first and second metatarsal with corticated bony fragment interposed contiguous with midfoot. A second bony fragment projects within the medial midfoot without donor site.  Osteopenia. Severe vascular calcifications. Dorsal and forefoot soft tissue swelling without subcutaneous gas or radiopaque foreign bodies. IMPRESSION: Focal lucency dorsum first distal phalanx most consistent with focal osteomyelitis. 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 Electronically Signed   By: Awilda Metro M.D.   On: 11/28/2017 19:33    Assessment/Plan: Angy Swearengin is a 67 y.o. female admitted with gangrene of L great toe. She has a hx of PAD and has had revascularization with nml ABIs most recently in December. She has no abx  allergies and was on augmentin for the last 5 days prior to admission.  Cultures are pending.  MRSA PCR negative.  Recommendations  Cont zosyn  Can likely dc on oral augmentin for a 14 day course and follow up on cultures if she is ready for DC in next 1-2 days prior to culture returning.      Thank you very much for the consult. Will follow with you.  Mick Sell   11/30/2017, 3:51 PM

## 2017-12-01 LAB — SURGICAL PATHOLOGY

## 2017-12-04 LAB — AEROBIC/ANAEROBIC CULTURE W GRAM STAIN (SURGICAL/DEEP WOUND)

## 2017-12-04 LAB — AEROBIC/ANAEROBIC CULTURE (SURGICAL/DEEP WOUND)

## 2018-01-31 ENCOUNTER — Ambulatory Visit (INDEPENDENT_AMBULATORY_CARE_PROVIDER_SITE_OTHER): Payer: Medicare (Managed Care)

## 2018-01-31 ENCOUNTER — Encounter (INDEPENDENT_AMBULATORY_CARE_PROVIDER_SITE_OTHER): Payer: Self-pay | Admitting: Vascular Surgery

## 2018-01-31 ENCOUNTER — Ambulatory Visit (INDEPENDENT_AMBULATORY_CARE_PROVIDER_SITE_OTHER): Payer: Medicare (Managed Care) | Admitting: Vascular Surgery

## 2018-01-31 VITALS — BP 150/78 | HR 77 | Resp 16 | Ht 61.0 in | Wt 147.0 lb

## 2018-01-31 DIAGNOSIS — E1122 Type 2 diabetes mellitus with diabetic chronic kidney disease: Secondary | ICD-10-CM | POA: Diagnosis not present

## 2018-01-31 DIAGNOSIS — E785 Hyperlipidemia, unspecified: Secondary | ICD-10-CM

## 2018-01-31 DIAGNOSIS — I739 Peripheral vascular disease, unspecified: Secondary | ICD-10-CM

## 2018-01-31 DIAGNOSIS — I70262 Atherosclerosis of native arteries of extremities with gangrene, left leg: Secondary | ICD-10-CM

## 2018-01-31 DIAGNOSIS — Z992 Dependence on renal dialysis: Secondary | ICD-10-CM

## 2018-01-31 DIAGNOSIS — N186 End stage renal disease: Secondary | ICD-10-CM

## 2018-01-31 DIAGNOSIS — I10 Essential (primary) hypertension: Secondary | ICD-10-CM | POA: Diagnosis not present

## 2018-01-31 NOTE — Assessment & Plan Note (Signed)
Her ABIs today are noncompressible but her waveforms are fairly good bilaterally. Perfusion is currently intact.  Wound is healing well.  Multiple atherosclerotic risk factors going forward, and we will continue to monitor her blood flow.  I will see her back in 6 months.  Continue aspirin and statin agent.

## 2018-01-31 NOTE — Patient Instructions (Signed)

## 2018-01-31 NOTE — Progress Notes (Signed)
MRN : [id number]  Nichole Hurst is a 67 y.o. (14-Nov-1950) female who presents with chief complaint of  Chief Complaint  Patient presents with  . Follow-up    3-66monthabi  .  History of Present Illness: Patient returns today in follow up of PAD.  She had to have her left great toe removed for gangrenous changes, but that is healing well.  No new issues or problems.  Not a lot of pain.  Her ABIs today are noncompressible but her waveforms are fairly good bilaterally.         Current Outpatient Medications  Medication Sig Dispense Refill  . aspirin EC 81 MG tablet Take 81 mg by mouth at bedtime.    .Marland Kitchenatorvastatin (LIPITOR) 80 MG tablet Take 80 mg by mouth every evening.    . bimatoprost (LUMIGAN) 0.03 % ophthalmic solution Place 1 drop into both eyes 2 (two) times daily.    . Brinzolamide-Brimonidine 1-0.2 % SUSP Apply 1 drop to eye 3 (three) times daily. LEFT EYE    . calcium acetate (PHOSLO) 667 MG capsule Take 1,334 mg by mouth 3 (three) times daily with meals.    . diclofenac sodium (VOLTAREN) 1 % GEL Apply topically 4 (four) times daily.    . Difluprednate (DUREZOL) 0.05 % EMUL Place 1 drop into the left eye 4 (four) times daily.     . Ferrous Sulfate 140 (45 Fe) MG TBCR Take 1 tablet by mouth daily.    . Fluticasone Propionate (FLONASE NA) Place 2 puffs into the nose daily.    . Ipratropium-Albuterol (COMBIVENT) 20-100 MCG/ACT AERS respimat Inhale 2 puffs into the lungs every 6 (six) hours.    . irbesartan (AVAPRO) 150 MG tablet Take 1 tablet (150 mg total) by mouth at bedtime. 30 tablet 1  . loperamide (IMODIUM) 2 MG capsule Take by mouth as needed for diarrhea or loose stools.    . Metoprolol Succinate 25 MG CS24 Take 25 mg by mouth daily.    .Marland Kitchenmoxifloxacin (VIGAMOX) 0.5 % ophthalmic solution Place 1 drop into the left eye 4 (four) times daily.    . nitroGLYCERIN (NITROSTAT) 0.4 MG SL tablet Place 0.4 mg under the tongue every 5 (five) minutes as  needed for chest pain.    .Marland Kitchenondansetron (ZOFRAN) 4 MG tablet Take 4 mg by mouth every 8 (eight) hours as needed for nausea or vomiting.    . ticagrelor (BRILINTA) 60 MG TABS tablet Take 60 mg by mouth 2 (two) times daily.     . timolol (BETIMOL) 0.5 % ophthalmic solution Place 1 drop into the left eye 2 (two) times daily.     . traMADol (ULTRAM) 50 MG tablet Take 1 tablet (50 mg total) by mouth 3 (three) times daily as needed for moderate pain or severe pain. 15 tablet 0  . Vitamin D, Ergocalciferol, (DRISDOL) 50000 units CAPS capsule Take 50,000 Units by mouth every 30 (thirty) days.     No current facility-administered medications for this visit.         Past Medical History:  Diagnosis Date  . Anemia of chronic disease   . CAD (coronary artery disease)    a. s/p 2-V CABG 05/2016 (LIMA-LAD, VG-OM1)  . Carotid stenosis   . Charcot's gait    FOOT  . Claustrophobia   . Colon polyp   . Diabetes mellitus with complication ([place]   . [place]physe[place]/COPD ([facility]   . ESRD (end stage renal disease) on dialysis ([place]  DIAYLISIS M/W/F  . Fractures    Lt foot  . GERD (gastroesophageal reflux disease)   . Glaucoma   . History of kidney stones   . Hyperlipidemia   . Hypertension   . Migraine   . Orthopnea   . Oxygen deficiency    AS NEEDED  . PAD (peripheral artery disease) ([place])   . Pain    JOINTS  . Presence of permanent cardiac pacemaker    St. Jude   . Retinopathy    DIABETIC  . Stroke [facility]) 09/2015   Ischemic stroke with R sided hemiparesis         Past Surgical History:  Procedure Laterality Date  . ABDOMINAL HYSTERECTOMY    . AV FISTULA PLACEMENT     LEFT ARM  . CATARACT EXTRACTION W/PHACO Right 08/25/2016   Procedure: CATARACT EXTRACTION PHACO AND INTRAOCULAR LENS PLACEMENT (IOC) AND ANTERIOR VITRECTOMY;  Surgeon: Estill Cotta, MD;  Location: [facility] ORS;  Service: Ophthalmology;  Laterality: Right;  FLUID CASSETTE  [id number] H, EXP 01/05/18  . CHOLECYSTECTOMY    . CORONARY ARTERY BYPASS GRAFT     05/13/2016  . INSERT / REPLACE / REMOVE PACEMAKER     6/17  . INSERTION OF AHMED VALVE Left 08/04/2017   Procedure: INSERTION OF AHMED VALVE;  Surgeon: Leandrew Koyanagi, MD;  Location: [facility] ORS;  Service: Ophthalmology;  Laterality: Left;  . LOWER EXTREMITY ANGIOGRAPHY Left 06/13/2017   Procedure: Lower Extremity Angiography;  Surgeon: Algernon Huxley, MD;  Location: [place] [facility];  Service: Cardiovascular;  Laterality: Left;  . PACEMAKER IMPLANT    . PERIPHERAL VASCULAR CATHETERIZATION Left 04/08/2016   Procedure: A/V Shuntogram/Fistulagram;  Surgeon: Algernon Huxley, MD;  Location: [place] [facility];  Service: Cardiovascular;  Laterality: Left;  . PERIPHERAL VASCULAR CATHETERIZATION N/A 04/08/2016   Procedure: A/V Shunt Intervention;  Surgeon: Algernon Huxley, MD;  Location: [place] [facility];  Service: Cardiovascular;  Laterality: N/A;  . STONES     KIDNEY  . TONSILLECTOMY    . TUBAL LIGATION            Family History  Problem Relation Age of Onset  . Diabetes Mother   No bleeding disorders, clotting disorders, or autoimmune dsieases  Social History     Social History  Substance Use Topics  . Smoking status: Former Research scientist (life sciences)  . Smokeless tobacco: Former Systems developer  . Alcohol use No  No IVDU      Allergies  Allergen Reactions  . Colchicine Anaphylaxis    REVIEW OF SYSTEMS(Negative unless checked)  Constitutional: [] Weight loss[] Fever[] Chills Cardiac:[] Chest pain[] Chest pressure[] Palpitations [] Shortness of breath when laying flat [] Shortness of breath at rest [x] Shortness of breath with exertion. Vascular: [] Pain in legs with walking[] Pain in legsat rest[] Pain in legs when laying flat [] Claudication [] Pain in feet when walking [] Pain in feet at rest [] Pain in feet when laying flat [] History of DVT [] Phlebitis [x] Swelling in  legs [] Varicose veins [x] Non-healing ulcers Pulmonary: [] Uses home oxygen [] Productive cough[] Hemoptysis [] Wheeze [] COPD [] Asthma Neurologic: [] Dizziness [] Blackouts [] Seizures [] History of stroke [] History of TIA[] Aphasia [] Temporary blindness[] Dysphagia [] Weaknessor numbness in arms [x] Weakness or numbnessin legs Musculoskeletal: [x] Arthritis [] Joint swelling [] Joint pain [] Low back pain Hematologic:[] Easy bruising[] Easy bleeding [] Hypercoagulable state [] Anemic [] Hepatitis Gastrointestinal:[] Blood in stool[] Vomiting blood[] Gastroesophageal reflux/heartburn[] Abdominal pain Genitourinary: [x] Chronic kidney disease [] Difficulturination [] Frequenturination [] Burning with urination[] Hematuria Skin: [] Rashes [x] Ulcers [x] Wounds Psychological: [] History of anxiety[] History of major depression.      Physical Examination  BP (!) 150/78 (BP Location: Right Arm)   Pulse 77   Resp 16   Ht  5' 1"  (1.549 m)   Wt 147 lb (66.7 kg)   BMI 27.78 kg/m  Gen:  WD/WN, NAD. Appears older than stated age. Head: [place]/AT, No temporalis wasting. Ear/Nose/Throat: Hearing grossly intact, nares w/o erythema or drainage, trachea midline Eyes: Conjunctiva clear. Sclera non-icteric Neck: Supple.  No JVD.  Pulmonary:  Good air movement, no use of accessory muscles.  Cardiac: RRR Vascular:  Vessel Right Left  Radial Palpable Palpable                          PT 1+ Palpable Palpable  DP 1+ Palpable 1+ Palpable    Musculoskeletal: M/S 5/5 throughout.  Left great toe amputation site healing well.  Trace LLE edema. Neurologic: Sensation grossly intact in extremities.  Symmetrical.  Speech is fluent.  Psychiatric: Judgment intact, Mood & affect appropriate for pt's clinical situation. Dermatologic: No rashes or ulcers noted.  No cellulitis or open wounds.       Labs Recent Results (from the past 2160 hour(s))  Glucose,  capillary     Status: Abnormal   Collection Time: 11/28/17  4:52 PM  Result Value Ref Range   Glucose-Capillary 170 (H) 65 - 99 mg/dL  Surgical PCR screen     Status: None   Collection Time: 11/28/17  5:37 PM  Result Value Ref Range   MRSA, PCR NEGATIVE NEGATIVE   Staphylococcus aureus NEGATIVE NEGATIVE    Comment: (NOTE) The Xpert SA Assay (FDA approved for NASAL specimens in patients 21 years of age and older), is one component of a comprehensive surveillance program. It is not intended to diagnose infection nor to guide or monitor treatment. Performed at [facility], [address] [place], [place] [address]   CBC     Status: Abnormal   Collection Time: 11/28/17  6:17 PM  Result Value Ref Range   WBC 7.4 3.6 - 11.0 K/uL   RBC 3.86 3.80 - 5.20 MIL/uL   Hemoglobin 11.0 (L) 12.0 - 16.0 g/dL   HCT 34.0 (L) 35.0 - 47.0 %   MCV 88.1 80.0 - 100.0 fL   MCH 28.6 26.0 - 34.0 pg   MCHC 32.4 32.0 - 36.0 g/dL   RDW 21.2 (H) 11.5 - 14.5 %   Platelets 310 150 - 440 K/uL    Comment: Performed at [facility], [address] [place], [place] [address]  Creatinine, serum     Status: Abnormal   Collection Time: 11/28/17  6:17 PM  Result Value Ref Range   Creatinine, Ser 4.20 (H) 0.44 - 1.00 mg/dL   GFR calc non Af Amer 10 (L) >60 mL/min   GFR calc Af Amer 12 (L) >60 mL/min    Comment: (NOTE) The eGFR has been calculated using the CKD EPI equation. This calculation has not been validated in all clinical situations. eGFR's persistently <60 mL/min signify possible Chronic Kidney Disease. Performed at [facility], [address] [place], [place] [address]   Glucose, capillary     Status: Abnormal   Collection Time: 11/28/17  9:10 PM  Result Value Ref Range   Glucose-Capillary 217 (H) 65 - 99 mg/dL  Basic metabolic panel     Status: Abnormal   Collection Time: 11/29/17  3:06 AM  Result Value Ref Range   Sodium 136 135 - 145 mmol/L   Potassium 4.0 3.5  - 5.1 mmol/L   Chloride 93 (L) 101 - 111 mmol/L   CO2 33 (H) 22 -  32 mmol/L   Glucose, Bld 228 (H) 65 - 99 mg/dL   BUN 22 (H) 6 - 20 mg/dL   Creatinine, Ser 5.34 (H) 0.44 - 1.00 mg/dL   Calcium 8.7 (L) 8.9 - 10.3 mg/dL   GFR calc non Af Amer 8 (L) >60 mL/min   GFR calc Af Amer 9 (L) >60 mL/min    Comment: (NOTE) The eGFR has been calculated using the CKD EPI equation. This calculation has not been validated in all clinical situations. eGFR's persistently <60 mL/min signify possible Chronic Kidney Disease.    Anion gap 10 5 - 15    Comment: Performed at [facility], [address] [place], [place] [address]  CBC     Status: Abnormal   Collection Time: 11/29/17  3:06 AM  Result Value Ref Range   WBC 6.7 3.6 - 11.0 K/uL   RBC 3.40 (L) 3.80 - 5.20 MIL/uL   Hemoglobin 9.5 (L) 12.0 - 16.0 g/dL   HCT 30.3 (L) 35.0 - 47.0 %   MCV 89.1 80.0 - 100.0 fL   MCH 27.8 26.0 - 34.0 pg   MCHC 31.2 (L) 32.0 - 36.0 g/dL   RDW 21.5 (H) 11.5 - 14.5 %   Platelets 264 150 - 440 K/uL    Comment: Performed at [facility], [address] [place], [place] [address]  Glucose, capillary     Status: Abnormal   Collection Time: 11/29/17  7:46 AM  Result Value Ref Range   Glucose-Capillary 182 (H) 65 - 99 mg/dL  Hemoglobin A1c     Status: Abnormal   Collection Time: 11/29/17  8:18 AM  Result Value Ref Range   Hgb A1c MFr Bld 7.2 (H) 4.8 - 5.6 %    Comment: (NOTE) Pre diabetes:          5.7%-6.4% Diabetes:              >6.4% Glycemic control for   <7.0% adults with diabetes    Mean Plasma Glucose 159.94 mg/dL    Comment: Performed at [facility], [place] [address] [place], [place] [address]  Glucose, capillary     Status: Abnormal   Collection Time: 11/29/17 11:47 AM  Result Value Ref Range   Glucose-Capillary 103 (H) 65 - 99 mg/dL  Aerobic/Anaerobic Culture (surgical/deep wound)     Status: None   Collection Time: 11/29/17  1:33 PM  Result Value Ref Range   Specimen  Description      BONE LEFT TOE GREAT TOE Performed at [facility], [place] [address] [place], [place] [address]    Special Requests      NONE Performed at [facility], [place], [place] [address]    Gram Stain      FEW WBC PRESENT, PREDOMINANTLY PMN RARE GRAM POSITIVE COCCI    Culture      FEW KLEBSIELLA PNEUMONIAE FEW STAPHYLOCOCCUS AUREUS NO ANAEROBES ISOLATED Performed at [facility], [place] [address] [place], [place] [address]    Report Status 12/04/2017 FINAL    Organism ID, Bacteria KLEBSIELLA PNEUMONIAE    Organism ID, Bacteria STAPHYLOCOCCUS AUREUS       Susceptibility   Klebsiella pneumoniae - MIC*    AMPICILLIN RESISTANT Resistant     CEFAZOLIN <=4 SENSITIVE Sensitive     CEFEPIME <=1 SENSITIVE Sensitive     CEFTAZIDIME <=1 SENSITIVE Sensitive     CEFTRIAXONE <=1 SENSITIVE Sensitive     CIPROFLOXACIN <=0.25  SENSITIVE Sensitive     GENTAMICIN <=1 SENSITIVE Sensitive     IMIPENEM <=0.25 SENSITIVE Sensitive     TRIMETH/SULFA <=20 SENSITIVE Sensitive     AMPICILLIN/SULBACTAM 4 SENSITIVE Sensitive     PIP/TAZO <=4 SENSITIVE Sensitive     Extended ESBL NEGATIVE Sensitive     * FEW KLEBSIELLA PNEUMONIAE   Staphylococcus aureus - MIC*    CIPROFLOXACIN <=0.5 SENSITIVE Sensitive     ERYTHROMYCIN <=0.25 SENSITIVE Sensitive     GENTAMICIN <=0.5 SENSITIVE Sensitive     OXACILLIN 0.5 SENSITIVE Sensitive     TETRACYCLINE <=1 SENSITIVE Sensitive     VANCOMYCIN 1 SENSITIVE Sensitive     TRIMETH/SULFA <=10 SENSITIVE Sensitive     CLINDAMYCIN <=0.25 SENSITIVE Sensitive     RIFAMPIN <=0.5 SENSITIVE Sensitive     Inducible Clindamycin NEGATIVE Sensitive     * FEW STAPHYLOCOCCUS AUREUS  Surgical pathology     Status: None   Collection Time: 11/29/17  1:35 PM  Result Value Ref Range   SURGICAL PATHOLOGY      Surgical Pathology CASE: [phone] PATIENT: Sherre Scarlet Surgical Pathology Report     SPECIMEN SUBMITTED: A. Great toe,  left  CLINICAL HISTORY: None provided  PRE-OPERATIVE DIAGNOSIS: Gangrene left great toe  POST-OPERATIVE DIAGNOSIS: Same as pre-op     DIAGNOSIS: A.  LEFT GREAT TOE; AMPUTATION: - ULCERATION AND NECROSIS. - OSTEOMYELITIS. - THE BONE AND SOFT TISSUE MARGINS ARE VIABLE.   GROSS DESCRIPTION:  A. Labeled: left great toe  Size: 4.4 x 2.4 by 2.2 cm  Description of lesion(s): entire distal aspect ulcerated  Proximal margin: ulcerations / discoloration 0.5 cm from skin margin, margin inked blue  Bone: bone at margin is a smooth cut surface  Other findings: received in formalin  Block summary: 1-perpendicular skin margin 2-en face bone margin following decalcification 3-representative ulcer and underlying bone following decalcification   Final Diagnosis performed by Delorse Lek, MD.  Electronically signed 12/01/2017 4:49:4 0PM    The electronic signature indicates that the named Attending Pathologist has evaluated the specimen  Technical component performed at [facility], [address] [place], [place] [address] Lab: [phone] Dir: Rush Farmer, MD, MMM  Professional component performed at [facility], [facility], [place], [place], [place] [address] Lab: [phone] Dir: Dellia Nims. Rubinas, MD    Glucose, capillary     Status: Abnormal   Collection Time: 11/29/17  2:03 PM  Result Value Ref Range   Glucose-Capillary 136 (H) 65 - 99 mg/dL  Glucose, capillary     Status: Abnormal   Collection Time: 11/29/17  4:48 PM  Result Value Ref Range   Glucose-Capillary 146 (H) 65 - 99 mg/dL  Glucose, capillary     Status: Abnormal   Collection Time: 11/29/17  9:06 PM  Result Value Ref Range   Glucose-Capillary 224 (H) 65 - 99 mg/dL  CBC     Status: Abnormal   Collection Time: 11/30/17  3:52 AM  Result Value Ref Range   WBC 5.6 3.6 - 11.0 K/uL   RBC 3.48 (L) 3.80 - 5.20 MIL/uL   Hemoglobin 9.8 (L) 12.0 - 16.0 g/dL   HCT 30.8 (L) 35.0 - 47.0 %    MCV 88.5 80.0 - 100.0 fL   MCH 28.1 26.0 - 34.0 pg   MCHC 31.8 (L) 32.0 - 36.0 g/dL   RDW 21.9 (H) 11.5 - 14.5 %   Platelets 278 150 - 440 K/uL    Comment: Performed at [facility], [address] [place], [place] [address]  Basic metabolic panel     Status: Abnormal   Collection Time: 11/30/17  3:52 AM  Result Value Ref Range   Sodium 136 135 - 145 mmol/L   Potassium 4.2 3.5 - 5.1 mmol/L   Chloride 92 (L) 101 - 111 mmol/L   CO2 31 22 - 32 mmol/L   Glucose, Bld 172 (H) 65 - 99 mg/dL   BUN 35 (H) 6 - 20 mg/dL   Creatinine, Ser 6.84 (H) 0.44 - 1.00 mg/dL   Calcium 9.0 8.9 - 10.3 mg/dL   GFR calc non Af Amer 6 (L) >60 mL/min   GFR calc Af Amer 7 (L) >60 mL/min    Comment: (NOTE) The eGFR has been calculated using the CKD EPI equation. This calculation has not been validated in all clinical situations. eGFR's persistently <60 mL/min signify possible Chronic Kidney Disease.    Anion gap 13 5 - 15    Comment: Performed at [facility], [address] [place], [place] [address]  Glucose, capillary     Status: Abnormal   Collection Time: 11/30/17  8:14 AM  Result Value Ref Range   Glucose-Capillary 180 (H) 65 - 99 mg/dL  Glucose, capillary     Status: Abnormal   Collection Time: 11/30/17 12:10 PM  Result Value Ref Range   Glucose-Capillary 155 (H) 65 - 99 mg/dL  Glucose, capillary     Status: Abnormal   Collection Time: 11/30/17  5:35 PM  Result Value Ref Range   Glucose-Capillary 142 (H) 65 - 99 mg/dL    Radiology No results found.   Assessment/Plan Hyperlipidemia lipid control important in reducing the progression of atherosclerotic disease. Continue statin therapy   Type 2 diabetes mellitus with end-stage renal disease (HCC) blood glucose control important in reducing the progression of atherosclerotic disease. Also, involved in wound healing. On appropriate medications.   BP (high blood pressure) blood pressure control important in  reducing the progression of atherosclerotic disease. On appropriate oral medications.   PAD (peripheral artery disease) (HCC) Her ABIs today are noncompressible but her waveforms are fairly good bilaterally. Perfusion is currently intact.  Wound is healing well.  Multiple atherosclerotic risk factors going forward, and we will continue to monitor her blood flow.  I will see her back in 6 months.  Continue aspirin and statin agent.    Leotis Pain, MD  01/31/2018 2:40 PM    This note was created with Dragon medical transcription system.  Any errors from dictation are purely unintentional

## 2018-02-11 ENCOUNTER — Emergency Department: Payer: No Typology Code available for payment source

## 2018-02-11 ENCOUNTER — Other Ambulatory Visit: Payer: Self-pay

## 2018-02-11 ENCOUNTER — Encounter: Payer: Self-pay | Admitting: Emergency Medicine

## 2018-02-11 ENCOUNTER — Inpatient Hospital Stay
Admission: EM | Admit: 2018-02-11 | Discharge: 2018-02-17 | DRG: 280 | Disposition: A | Discharge status: Home / Self Care | Payer: No Typology Code available for payment source | Attending: Internal Medicine | Admitting: Internal Medicine

## 2018-02-11 DIAGNOSIS — J449 Chronic obstructive pulmonary disease, unspecified: Secondary | ICD-10-CM | POA: Diagnosis not present

## 2018-02-11 DIAGNOSIS — R079 Chest pain, unspecified: Secondary | ICD-10-CM

## 2018-02-11 DIAGNOSIS — I272 Pulmonary hypertension, unspecified: Secondary | ICD-10-CM | POA: Diagnosis present

## 2018-02-11 DIAGNOSIS — Z89412 Acquired absence of left great toe: Secondary | ICD-10-CM

## 2018-02-11 DIAGNOSIS — I5043 Acute on chronic combined systolic (congestive) and diastolic (congestive) heart failure: Secondary | ICD-10-CM | POA: Diagnosis present

## 2018-02-11 DIAGNOSIS — R0602 Shortness of breath: Secondary | ICD-10-CM

## 2018-02-11 DIAGNOSIS — E1122 Type 2 diabetes mellitus with diabetic chronic kidney disease: Secondary | ICD-10-CM | POA: Diagnosis present

## 2018-02-11 DIAGNOSIS — I214 Non-ST elevation (NSTEMI) myocardial infarction: Secondary | ICD-10-CM | POA: Diagnosis present

## 2018-02-11 DIAGNOSIS — Z8673 Personal history of transient ischemic attack (TIA), and cerebral infarction without residual deficits: Secondary | ICD-10-CM

## 2018-02-11 DIAGNOSIS — E785 Hyperlipidemia, unspecified: Secondary | ICD-10-CM | POA: Diagnosis present

## 2018-02-11 DIAGNOSIS — E11319 Type 2 diabetes mellitus with unspecified diabetic retinopathy without macular edema: Secondary | ICD-10-CM | POA: Diagnosis present

## 2018-02-11 DIAGNOSIS — Z9981 Dependence on supplemental oxygen: Secondary | ICD-10-CM

## 2018-02-11 DIAGNOSIS — Z950 Presence of cardiac pacemaker: Secondary | ICD-10-CM

## 2018-02-11 DIAGNOSIS — I5084 End stage heart failure: Secondary | ICD-10-CM | POA: Diagnosis present

## 2018-02-11 DIAGNOSIS — I2510 Atherosclerotic heart disease of native coronary artery without angina pectoris: Secondary | ICD-10-CM | POA: Diagnosis present

## 2018-02-11 DIAGNOSIS — N186 End stage renal disease: Secondary | ICD-10-CM | POA: Diagnosis present

## 2018-02-11 DIAGNOSIS — H409 Unspecified glaucoma: Secondary | ICD-10-CM | POA: Diagnosis present

## 2018-02-11 DIAGNOSIS — K219 Gastro-esophageal reflux disease without esophagitis: Secondary | ICD-10-CM | POA: Diagnosis present

## 2018-02-11 DIAGNOSIS — I132 Hypertensive heart and chronic kidney disease with heart failure and with stage 5 chronic kidney disease, or end stage renal disease: Secondary | ICD-10-CM | POA: Diagnosis present

## 2018-02-11 DIAGNOSIS — Z888 Allergy status to other drugs, medicaments and biological substances status: Secondary | ICD-10-CM

## 2018-02-11 DIAGNOSIS — Z9841 Cataract extraction status, right eye: Secondary | ICD-10-CM

## 2018-02-11 DIAGNOSIS — Z87891 Personal history of nicotine dependence: Secondary | ICD-10-CM | POA: Diagnosis not present

## 2018-02-11 DIAGNOSIS — E1165 Type 2 diabetes mellitus with hyperglycemia: Secondary | ICD-10-CM | POA: Diagnosis present

## 2018-02-11 DIAGNOSIS — Z992 Dependence on renal dialysis: Secondary | ICD-10-CM

## 2018-02-11 DIAGNOSIS — E875 Hyperkalemia: Secondary | ICD-10-CM | POA: Diagnosis present

## 2018-02-11 DIAGNOSIS — I5021 Acute systolic (congestive) heart failure: Secondary | ICD-10-CM

## 2018-02-11 DIAGNOSIS — R0789 Other chest pain: Secondary | ICD-10-CM | POA: Diagnosis present

## 2018-02-11 DIAGNOSIS — F40240 Claustrophobia: Secondary | ICD-10-CM | POA: Diagnosis present

## 2018-02-11 DIAGNOSIS — G8191 Hemiplegia, unspecified affecting right dominant side: Secondary | ICD-10-CM | POA: Diagnosis present

## 2018-02-11 DIAGNOSIS — Z87442 Personal history of urinary calculi: Secondary | ICD-10-CM

## 2018-02-11 DIAGNOSIS — Z9851 Tubal ligation status: Secondary | ICD-10-CM

## 2018-02-11 DIAGNOSIS — Z961 Presence of intraocular lens: Secondary | ICD-10-CM | POA: Diagnosis present

## 2018-02-11 DIAGNOSIS — J9601 Acute respiratory failure with hypoxia: Secondary | ICD-10-CM | POA: Diagnosis present

## 2018-02-11 DIAGNOSIS — Z9089 Acquired absence of other organs: Secondary | ICD-10-CM

## 2018-02-11 DIAGNOSIS — Z794 Long term (current) use of insulin: Secondary | ICD-10-CM

## 2018-02-11 DIAGNOSIS — I447 Left bundle-branch block, unspecified: Secondary | ICD-10-CM | POA: Diagnosis present

## 2018-02-11 DIAGNOSIS — I700 Atherosclerosis of aorta: Secondary | ICD-10-CM | POA: Diagnosis present

## 2018-02-11 DIAGNOSIS — Z8249 Family history of ischemic heart disease and other diseases of the circulatory system: Secondary | ICD-10-CM

## 2018-02-11 DIAGNOSIS — I10 Essential (primary) hypertension: Secondary | ICD-10-CM | POA: Diagnosis not present

## 2018-02-11 DIAGNOSIS — Z79899 Other long term (current) drug therapy: Secondary | ICD-10-CM

## 2018-02-11 DIAGNOSIS — Z951 Presence of aortocoronary bypass graft: Secondary | ICD-10-CM

## 2018-02-11 DIAGNOSIS — I340 Nonrheumatic mitral (valve) insufficiency: Secondary | ICD-10-CM | POA: Diagnosis not present

## 2018-02-11 DIAGNOSIS — J441 Chronic obstructive pulmonary disease with (acute) exacerbation: Secondary | ICD-10-CM | POA: Diagnosis present

## 2018-02-11 DIAGNOSIS — N2581 Secondary hyperparathyroidism of renal origin: Secondary | ICD-10-CM | POA: Diagnosis present

## 2018-02-11 DIAGNOSIS — Z9049 Acquired absence of other specified parts of digestive tract: Secondary | ICD-10-CM

## 2018-02-11 DIAGNOSIS — E1151 Type 2 diabetes mellitus with diabetic peripheral angiopathy without gangrene: Secondary | ICD-10-CM | POA: Diagnosis present

## 2018-02-11 DIAGNOSIS — D631 Anemia in chronic kidney disease: Secondary | ICD-10-CM | POA: Diagnosis present

## 2018-02-11 DIAGNOSIS — J811 Chronic pulmonary edema: Secondary | ICD-10-CM | POA: Diagnosis not present

## 2018-02-11 DIAGNOSIS — I953 Hypotension of hemodialysis: Secondary | ICD-10-CM | POA: Diagnosis present

## 2018-02-11 DIAGNOSIS — I2584 Coronary atherosclerosis due to calcified coronary lesion: Secondary | ICD-10-CM | POA: Diagnosis present

## 2018-02-11 DIAGNOSIS — Z833 Family history of diabetes mellitus: Secondary | ICD-10-CM

## 2018-02-11 DIAGNOSIS — Z90710 Acquired absence of both cervix and uterus: Secondary | ICD-10-CM

## 2018-02-11 DIAGNOSIS — Z86010 Personal history of colonic polyps: Secondary | ICD-10-CM

## 2018-02-11 DIAGNOSIS — I361 Nonrheumatic tricuspid (valve) insufficiency: Secondary | ICD-10-CM | POA: Diagnosis not present

## 2018-02-11 HISTORY — DX: Pulmonary hypertension, unspecified: I27.20

## 2018-02-11 LAB — BASIC METABOLIC PANEL
ANION GAP: 12 (ref 5–15)
BUN: 33 mg/dL — ABNORMAL HIGH (ref 6–20)
CALCIUM: 9.7 mg/dL (ref 8.9–10.3)
CO2: 32 mmol/L (ref 22–32)
Chloride: 92 mmol/L — ABNORMAL LOW (ref 101–111)
Creatinine, Ser: 6.58 mg/dL — ABNORMAL HIGH (ref 0.44–1.00)
GFR calc Af Amer: 7 mL/min — ABNORMAL LOW (ref >60)
GFR, EST NON AFRICAN AMERICAN: 6 mL/min — AB (ref >60)
Glucose, Bld: 245 mg/dL — ABNORMAL HIGH (ref 65–99)
POTASSIUM: 4.6 mmol/L (ref 3.5–5.1)
SODIUM: 136 mmol/L (ref 135–145)

## 2018-02-11 LAB — CBC
HEMATOCRIT: 40.2 % (ref 35.0–47.0)
HEMATOCRIT: 39.1 % (ref 35.0–47.0)
HEMOGLOBIN: 12.8 g/dL (ref 12.0–16.0)
Hemoglobin: 12.7 g/dL (ref 12.0–16.0)
MCH: 27.9 pg (ref 26.0–34.0)
MCH: 28.6 pg (ref 26.0–34.0)
MCHC: 31.7 g/dL — ABNORMAL LOW (ref 32.0–36.0)
MCHC: 32.4 g/dL (ref 32.0–36.0)
MCV: 88.2 fL (ref 80.0–100.0)
MCV: 88.3 fL (ref 80.0–100.0)
PLATELETS: 179 10*3/uL (ref 150–440)
Platelets: 198 10*3/uL (ref 150–440)
RBC: 4.56 MIL/uL (ref 3.80–5.20)
RBC: 4.43 MIL/uL (ref 3.80–5.20)
RDW: 19.8 % — AB (ref 11.5–14.5)
RDW: 20 % — AB (ref 11.5–14.5)
WBC: 6.2 10*3/uL (ref 3.6–11.0)
WBC: 5.8 10*3/uL (ref 3.6–11.0)

## 2018-02-11 LAB — GLUCOSE, CAPILLARY: GLUCOSE-CAPILLARY: 303 mg/dL — AB (ref 65–99)

## 2018-02-11 LAB — CREATININE, SERUM
Creatinine, Ser: 6.7 mg/dL — ABNORMAL HIGH (ref 0.44–1.00)
GFR calc Af Amer: 7 mL/min — ABNORMAL LOW (ref >60)
GFR, EST NON AFRICAN AMERICAN: 6 mL/min — AB (ref >60)

## 2018-02-11 LAB — TROPONIN I
TROPONIN I: 0.08 ng/mL — AB (ref <0.03)
Troponin I: 0.04 ng/mL (ref <0.03)
Troponin I: 0.1 ng/mL (ref <0.03)

## 2018-02-11 MED ORDER — CALCIUM ACETATE (PHOS BINDER) 667 MG PO CAPS
1334.0000 mg | ORAL_CAPSULE | Freq: Three times a day (TID) | ORAL | Status: DC
  Administered 2018-02-12 – 2018-02-17 (×13): 1334 mg via ORAL
  Filled 2018-02-11 (×12): qty 2

## 2018-02-11 MED ORDER — DIFLUPREDNATE 0.05 % OP EMUL
1.0000 [drp] | Freq: Every day | OPHTHALMIC | Status: DC
  Filled 2018-02-11 (×2): qty 5

## 2018-02-11 MED ORDER — METOPROLOL SUCCINATE ER 25 MG PO TB24: 25.0000 mg | ORAL_TABLET | Freq: Every evening | ORAL | Status: DC

## 2018-02-11 MED ORDER — LOSARTAN POTASSIUM 50 MG PO TABS
100.0000 mg | ORAL_TABLET | Freq: Every day | ORAL | Status: DC
  Administered 2018-02-12 – 2018-02-13 (×2): 100 mg via ORAL
  Filled 2018-02-11 (×6): qty 2

## 2018-02-11 MED ORDER — ASPIRIN 81 MG PO CHEW
324.0000 mg | CHEWABLE_TABLET | Freq: Once | ORAL | Status: AC
  Administered 2018-02-11: 324 mg via ORAL
  Filled 2018-02-11: qty 4

## 2018-02-11 MED ORDER — ONDANSETRON HCL 4 MG PO TABS: 4.0000 mg | ORAL_TABLET | Freq: Four times a day (QID) | ORAL | Status: DC | PRN

## 2018-02-11 MED ORDER — ALBUTEROL SULFATE (2.5 MG/3ML) 0.083% IN NEBU
5.0000 mg | INHALATION_SOLUTION | Freq: Once | RESPIRATORY_TRACT | Status: AC
Start: 2018-02-11 — End: 2018-02-11
  Administered 2018-02-11: 5 mg via RESPIRATORY_TRACT
  Filled 2018-02-11: qty 6

## 2018-02-11 MED ORDER — ASPIRIN EC 81 MG PO TBEC
81.0000 mg | DELAYED_RELEASE_TABLET | Freq: Every day | ORAL | Status: DC
  Administered 2018-02-12 – 2018-02-16 (×5): 81 mg via ORAL
  Filled 2018-02-11 (×5): qty 1

## 2018-02-11 MED ORDER — NITROGLYCERIN 0.4 MG SL SUBL: 0.4000 mg | SUBLINGUAL_TABLET | SUBLINGUAL | Status: DC | PRN

## 2018-02-11 MED ORDER — PANTOPRAZOLE SODIUM 40 MG PO TBEC
40.0000 mg | DELAYED_RELEASE_TABLET | Freq: Every day | ORAL | Status: DC
  Administered 2018-02-12 – 2018-02-17 (×6): 40 mg via ORAL
  Filled 2018-02-11 (×6): qty 1

## 2018-02-11 MED ORDER — TIMOLOL MALEATE 0.5 % OP SOLN
1.0000 [drp] | Freq: Two times a day (BID) | OPHTHALMIC | Status: DC
  Filled 2018-02-11 (×2): qty 5

## 2018-02-11 MED ORDER — HEPARIN SODIUM (PORCINE) 5000 UNIT/ML IJ SOLN
5000.0000 [IU] | Freq: Three times a day (TID) | INTRAMUSCULAR | Status: DC
  Filled 2018-02-11 (×2): qty 1

## 2018-02-11 MED ORDER — METHYLPREDNISOLONE SODIUM SUCC 125 MG IJ SOLR
125.0000 mg | Freq: Once | INTRAMUSCULAR | Status: AC
  Administered 2018-02-11: 125 mg via INTRAVENOUS
  Filled 2018-02-11: qty 2

## 2018-02-11 MED ORDER — METHYLPREDNISOLONE SODIUM SUCC 125 MG IJ SOLR
60.0000 mg | Freq: Four times a day (QID) | INTRAMUSCULAR | Status: DC
  Administered 2018-02-11 – 2018-02-15 (×12): 60 mg via INTRAVENOUS
  Filled 2018-02-11 (×12): qty 2

## 2018-02-11 MED ORDER — OXYCODONE HCL 5 MG PO TABS
5.0000 mg | ORAL_TABLET | ORAL | Status: DC | PRN
  Administered 2018-02-11 – 2018-02-16 (×7): 5 mg via ORAL
  Filled 2018-02-11 (×8): qty 1

## 2018-02-11 MED ORDER — ONDANSETRON HCL 4 MG/2ML IJ SOLN
4.0000 mg | Freq: Four times a day (QID) | INTRAMUSCULAR | Status: DC | PRN
  Administered 2018-02-12: 4 mg via INTRAVENOUS
  Filled 2018-02-11: qty 2

## 2018-02-11 MED ORDER — INSULIN ASPART 100 UNIT/ML ~~LOC~~ SOLN
0.0000 [IU] | Freq: Every day | SUBCUTANEOUS | Status: DC
  Administered 2018-02-11: 4 [IU] via SUBCUTANEOUS
  Filled 2018-02-11: qty 1

## 2018-02-11 MED ORDER — ONDANSETRON HCL 4 MG PO TABS
4.0000 mg | ORAL_TABLET | Freq: Three times a day (TID) | ORAL | Status: DC | PRN
Start: 2018-02-11 — End: 2018-02-17

## 2018-02-11 MED ORDER — TICAGRELOR 60 MG PO TABS
60.0000 mg | ORAL_TABLET | Freq: Two times a day (BID) | ORAL | Status: DC
  Administered 2018-02-12 – 2018-02-17 (×12): 60 mg via ORAL
  Filled 2018-02-11 (×13): qty 1

## 2018-02-11 MED ORDER — AZITHROMYCIN 250 MG PO TABS
500.0000 mg | ORAL_TABLET | Freq: Every day | ORAL | Status: AC
  Administered 2018-02-12 – 2018-02-13 (×3): 500 mg via ORAL
  Filled 2018-02-11 (×3): qty 2

## 2018-02-11 MED ORDER — ALBUTEROL SULFATE (2.5 MG/3ML) 0.083% IN NEBU
2.5000 mg | INHALATION_SOLUTION | RESPIRATORY_TRACT | Status: DC | PRN
  Administered 2018-02-13 – 2018-02-16 (×2): 2.5 mg via RESPIRATORY_TRACT
  Filled 2018-02-11 (×2): qty 3

## 2018-02-11 MED ORDER — DIPHENHYDRAMINE HCL 25 MG PO CAPS
25.0000 mg | ORAL_CAPSULE | ORAL | Status: DC | PRN
  Filled 2018-02-11: qty 1

## 2018-02-11 MED ORDER — DOCUSATE SODIUM 100 MG PO CAPS
100.0000 mg | ORAL_CAPSULE | Freq: Two times a day (BID) | ORAL | Status: DC
  Administered 2018-02-12 – 2018-02-16 (×9): 100 mg via ORAL
  Filled 2018-02-11 (×11): qty 1

## 2018-02-11 MED ORDER — OXYCODONE-ACETAMINOPHEN 5-325 MG PO TABS
1.0000 | ORAL_TABLET | Freq: Once | ORAL | Status: AC
  Administered 2018-02-11: 1 via ORAL
  Filled 2018-02-11: qty 1

## 2018-02-11 MED ORDER — FERROUS SULFATE 325 (65 FE) MG PO TABS
325.0000 mg | ORAL_TABLET | Freq: Every day | ORAL | Status: DC
  Administered 2018-02-12 – 2018-02-17 (×5): 325 mg via ORAL
  Filled 2018-02-11 (×6): qty 1

## 2018-02-11 MED ORDER — SODIUM CHLORIDE 0.9 % IV BOLUS
500.0000 mL | Freq: Once | INTRAVENOUS | Status: AC
  Administered 2018-02-11: 500 mL via INTRAVENOUS

## 2018-02-11 MED ORDER — LEVALBUTEROL HCL 1.25 MG/0.5ML IN NEBU: 1.2500 mg | INHALATION_SOLUTION | Freq: Four times a day (QID) | RESPIRATORY_TRACT | Status: DC

## 2018-02-11 MED ORDER — ATORVASTATIN CALCIUM 20 MG PO TABS
80.0000 mg | ORAL_TABLET | Freq: Every evening | ORAL | Status: DC
  Administered 2018-02-11 – 2018-02-16 (×6): 80 mg via ORAL
  Filled 2018-02-11 (×7): qty 4

## 2018-02-11 MED ORDER — INSULIN ASPART 100 UNIT/ML ~~LOC~~ SOLN
0.0000 [IU] | Freq: Three times a day (TID) | SUBCUTANEOUS | Status: DC
  Administered 2018-02-12: 5 [IU] via SUBCUTANEOUS
  Administered 2018-02-12 (×2): 9 [IU] via SUBCUTANEOUS
  Filled 2018-02-11: qty 1

## 2018-02-11 MED ORDER — TRAMADOL HCL 50 MG PO TABS: 50.0000 mg | ORAL_TABLET | Freq: Three times a day (TID) | ORAL | Status: DC | PRN

## 2018-02-11 MED ORDER — IPRATROPIUM-ALBUTEROL 0.5-2.5 (3) MG/3ML IN SOLN: 3.0000 mL | Freq: Four times a day (QID) | RESPIRATORY_TRACT | Status: DC

## 2018-02-11 MED ORDER — IRBESARTAN 150 MG PO TABS: 150.0000 mg | ORAL_TABLET | Freq: Every day | ORAL | Status: DC

## 2018-02-11 MED ORDER — FLUTICASONE PROPIONATE 50 MCG/ACT NA SUSP
2.0000 | Freq: Every day | NASAL | Status: DC | PRN
  Filled 2018-02-11: qty 16

## 2018-02-11 MED ORDER — ENOXAPARIN SODIUM 30 MG/0.3ML ~~LOC~~ SOLN: 30.0000 mg | SUBCUTANEOUS | Status: DC

## 2018-02-11 MED ORDER — LATANOPROST 0.005 % OP SOLN
1.0000 [drp] | Freq: Every day | OPHTHALMIC | Status: DC
  Filled 2018-02-11: qty 2.5

## 2018-02-11 MED ORDER — IPRATROPIUM-ALBUTEROL 20-100 MCG/ACT IN AERS: 2.0000 | INHALATION_SPRAY | Freq: Four times a day (QID) | RESPIRATORY_TRACT | Status: DC

## 2018-02-11 MED ORDER — IOHEXOL 350 MG/ML SOLN
75.0000 mL | Freq: Once | INTRAVENOUS | Status: AC | PRN
  Administered 2018-02-11: 75 mL via INTRAVENOUS

## 2018-02-11 MED ORDER — VITAMIN D (ERGOCALCIFEROL) 1.25 MG (50000 UNIT) PO CAPS: 50000.0000 [IU] | ORAL_CAPSULE | ORAL | Status: DC

## 2018-02-11 MED ORDER — MOXIFLOXACIN HCL 0.5 % OP SOLN
1.0000 [drp] | Freq: Four times a day (QID) | OPHTHALMIC | Status: DC
  Filled 2018-02-11 (×2): qty 3

## 2018-02-11 MED ORDER — FENTANYL CITRATE (PF) 100 MCG/2ML IJ SOLN
50.0000 ug | Freq: Once | INTRAMUSCULAR | Status: AC
  Administered 2018-02-11: 50 ug via INTRAVENOUS
  Filled 2018-02-11: qty 2

## 2018-02-11 MED ORDER — IPRATROPIUM-ALBUTEROL 0.5-2.5 (3) MG/3ML IN SOLN
3.0000 mL | Freq: Once | RESPIRATORY_TRACT | Status: AC
  Administered 2018-02-11: 3 mL via RESPIRATORY_TRACT
  Filled 2018-02-11: qty 3

## 2018-02-11 NOTE — ED Provider Notes (Addendum)
[facility] Emergency Department Provider Note  ____________________________________________   I have reviewed the triage vital signs and the nursing notes. Where available I have reviewed prior notes and, if possible and indicated, outside hospital notes.    HISTORY  Chief Complaint Chest Pain    HPI Nichole Hurst is a 67 y.o. female with a history of anemia CAD, carotid stenosis, COPD on home oxygen, dialysis, who is up-to-date on dialysis, last dialysis was Friday, presents today with shortness of breath and cough and wheeze.  Also pain in her chest when she coughs.  Been on for 2 days.  No fever no chills.  Does feel persistently short of breath however.  Is using home interventions without wheeze.  The pain is in the chest wall, nonradiating hurts when she coughs or changes position.  She does have her left foot in a boot from a history of toe amputation but there is no calf pain or swelling.  No history of DVT or PE      Past Medical History:  Diagnosis Date  . Anemia of chronic disease   . CAD (coronary artery disease)    a. s/p 2-V CABG 05/2016 (LIMA-LAD, VG-OM1)  . Carotid stenosis   . Charcot's gait    FOOT  . Claustrophobia   . Colon polyp   . Diabetes mellitus with complication (HCC)   . Emphysema/COPD (HCC)   . ESRD (end stage renal disease) on dialysis (HCC)    DIAYLISIS M/W/F  . Fractures    Lt foot  . GERD (gastroesophageal reflux disease)   . Glaucoma   . History of kidney stones   . Hyperlipidemia   . Hypertension   . Migraine   . Orthopnea   . Oxygen deficiency    AS NEEDED  . PAD (peripheral artery disease) (HCC)   . Pain    JOINTS  . Presence of permanent cardiac pacemaker    [employer]. Jude   . Retinopathy    DIABETIC  . Stroke [facility]) 09/2015   Ischemic stroke with R sided hemiparesis    Patient Active Problem List   Diagnosis Date Noted  . PAD (peripheral artery disease) (HCC) 01/31/2018  . Gangrene of toe of left foot  (HCC) 11/28/2017  . Gangrene (HCC) 11/28/2017  . Pulmonary edema 08/08/2017  . Atherosclerosis of native arteries of the extremities with gangrene (HCC) 06/08/2017  . CHF (congestive heart failure) (HCC) 10/30/2015  . History of stroke with residual deficit 10/30/2015  . Hyperlipidemia 10/30/2015  . Cerebrovascular accident (CVA) (HCC) 09/09/2015  . CAD in native artery 08/29/2015  . Carotid artery narrowing 11/26/2014  . Anemia of chronic disease 09/03/2014  . End stage renal failure on dialysis (HCC) 09/03/2014  . Chest pain 07/12/2014  . Breath shortness 07/12/2014  . Type 2 diabetes mellitus with end-stage renal disease (HCC) 03/17/2014  . BP (high blood pressure) 03/17/2014    Past Surgical History:  Procedure Laterality Date  . ABDOMINAL HYSTERECTOMY    . AMPUTATION TOE Left 11/29/2017   Procedure: AMPUTATION TOE-LEFT GREAT TOE;  Surgeon: Linus Galas, DPM;  Location: [facility] ORS;  Service: Podiatry;  Laterality: Left;  . AV FISTULA PLACEMENT     LEFT ARM  . CATARACT EXTRACTION W/PHACO Right 08/25/2016   Procedure: CATARACT EXTRACTION PHACO AND INTRAOCULAR LENS PLACEMENT (IOC) AND ANTERIOR VITRECTOMY;  Surgeon: Sallee Lange, MD;  Location: [facility] ORS;  Service: Ophthalmology;  Laterality: Right;  FLUID CASSETTE [id number] H, EXP 01/05/18  . CHOLECYSTECTOMY    .  CORONARY ARTERY BYPASS GRAFT     05/13/2016  . INSERT / REPLACE / REMOVE PACEMAKER     6/17  . INSERTION OF AHMED VALVE Left 08/04/2017   Procedure: INSERTION OF AHMED VALVE;  Surgeon: Lockie Mola, MD;  Location: [facility] ORS;  Service: Ophthalmology;  Laterality: Left;  . LOWER EXTREMITY ANGIOGRAPHY Left 06/13/2017   Procedure: Lower Extremity Angiography;  Surgeon: Annice Needy, MD;  Location: [facility] INVASIVE CV LAB;  Service: Cardiovascular;  Laterality: Left;  . PACEMAKER IMPLANT    . PERIPHERAL VASCULAR CATHETERIZATION Left 04/08/2016   Procedure: A/V Shuntogram/Fistulagram;  Surgeon: Annice Needy, MD;  Location: [facility]  INVASIVE CV LAB;  Service: Cardiovascular;  Laterality: Left;  . PERIPHERAL VASCULAR CATHETERIZATION N/A 04/08/2016   Procedure: A/V Shunt Intervention;  Surgeon: Annice Needy, MD;  Location: [facility] INVASIVE CV LAB;  Service: Cardiovascular;  Laterality: N/A;  . STONES     KIDNEY  . TONSILLECTOMY    . TUBAL LIGATION      Prior to Admission medications   Medication Sig Start Date End Date Taking? Authorizing Provider  aspirin EC 81 MG tablet Take 81 mg by mouth at bedtime.   Yes [provider]  atorvastatin (LIPITOR) 80 MG tablet Take 80 mg by mouth every evening.   Yes [provider]  bimatoprost (LUMIGAN) 0.03 % ophthalmic solution Place 1 drop into the right eye at bedtime.    Yes [provider]  Brinzolamide-Brimonidine 1-0.2 % SUSP Apply 1 drop to eye 3 (three) times daily. LEFT EYE   Yes [provider]  calcium acetate (PHOSLO) 667 MG capsule Take 1,334 mg by mouth 3 (three) times daily with meals.   Yes [provider]  Difluprednate (DUREZOL) 0.05 % EMUL Place 1 drop into the left eye daily.    Yes [provider]  Ferrous Sulfate 140 (45 Fe) MG TBCR Take 1 tablet by mouth daily.   Yes [provider]  irbesartan (AVAPRO) 150 MG tablet Take 1 tablet (150 mg total) by mouth at bedtime. 08/09/17  Yes Enedina Finner, MD  losartan (COZAAR) 100 MG tablet Take 100 mg by mouth at bedtime.   Yes [provider]  metoprolol succinate (TOPROL-XL) 25 MG 24 hr tablet Take 25 mg by mouth every evening.   Yes [provider]  moxifloxacin (VIGAMOX) 0.5 % ophthalmic solution Place 1 drop into the left eye 4 (four) times daily.   Yes [provider]  ticagrelor (BRILINTA) 60 MG TABS tablet Take 60 mg by mouth 2 (two) times daily.    Yes [provider]  timolol (BETIMOL) 0.5 % ophthalmic solution Place 1 drop into the left eye 2 (two) times daily.    Yes [provider]  Vitamin D, Ergocalciferol,  (DRISDOL) 50000 units CAPS capsule Take 50,000 Units by mouth every 30 (thirty) days.   Yes [provider]  amoxicillin-clavulanate (AUGMENTIN) 500-125 MG tablet Take 1 tablet by mouth 3 (three) times daily. Take 1 tablet by mouth once a day and administer additional dose by mouth during and at end of dialysis    [provider]  diclofenac sodium (VOLTAREN) 1 % GEL Apply topically 4 (four) times daily.    [provider]  diphenhydrAMINE (BENADRYL) 25 mg capsule Take 25 mg by mouth every 4 (four) hours as needed.    [provider]  Fluticasone Propionate (FLONASE NA) Place 2 puffs into the nose 2 (two) times daily as needed.  [provider]  HYDROcodone-acetaminophen (NORCO/VICODIN) 5-325 MG tablet Take 1 tablet by mouth every 4 (four) hours as needed for moderate pain. Patient not taking: Reported on 01/31/2018 11/30/17   Altamese Dilling, MD  Ipratropium-Albuterol (COMBIVENT) 20-100 MCG/ACT AERS respimat Inhale 2 puffs into the lungs every 6 (six) hours.    [provider]  loperamide (IMODIUM) 2 MG capsule Take by mouth as needed for diarrhea or loose stools.    [provider]  nitroGLYCERIN (NITROSTAT) 0.4 MG SL tablet Place 0.4 mg under the tongue every 5 (five) minutes as needed for chest pain.    [provider]  ondansetron (ZOFRAN) 4 MG tablet Take 4 mg by mouth every 8 (eight) hours as needed for nausea or vomiting.    [provider]  traMADol (ULTRAM) 50 MG tablet Take 1 tablet (50 mg total) by mouth 3 (three) times daily as needed for moderate pain or severe pain. Patient not taking: Reported on 01/31/2018 08/09/17   Enedina Finner, MD    Allergies Colchicine and Clopidogrel  Family History  Problem Relation Age of Onset  . Diabetes Mother   . Hypertension Mother   . Heart attack Mother     Social History Social History   Tobacco Use  . Smoking status: Former Smoker    Last attempt to  quit: 06/10/1983    Years since quitting: 34.6  . Smokeless tobacco: Former Engineer, water Use Topics  . Alcohol use: No  . Drug use: No    Review of Systems Constitutional: No fever/chills Eyes: No visual changes. ENT: No sore throat. No stiff neck no neck pain Cardiovascular: + chest pain. Respiratory: + shortness of breath. Gastrointestinal:   no vomiting.  No diarrhea.  No constipation. Genitourinary: Negative for dysuria. Musculoskeletal: Negative lower extremity swelling Skin: Negative for rash. Neurological: Negative for severe headaches, focal weakness or numbness.   ____________________________________________   PHYSICAL EXAM:  VITAL SIGNS: ED Triage Vitals  Enc Vitals Group     BP 02/11/18 1654 (!) 176/70     Pulse Rate 02/11/18 1654 (!) 115     Resp 02/11/18 1654 (!) 24     Temp 02/11/18 1654 98.4 F (36.9 C)     Temp Source 02/11/18 1654 Oral     SpO2 02/11/18 1654 100 %     Weight 02/11/18 1651 143 lb (64.9 kg)     Height 02/11/18 1651 5\' 1"  (1.549 m)     Head Circumference --      Peak Flow --      Pain Score 02/11/18 1650 5     Pain Loc --      Pain Edu? --      Excl. in GC? --     Constitutional: Alert and oriented. Well appearing and in no acute distress. Eyes: Conjunctivae are normal Head: Atraumatic HEENT: No congestion/rhinnorhea. Mucous membranes are moist.  Oropharynx non-erythematous Neck:   Nontender with no meningismus, no masses, no stridor Cardiovascular: Normal rate, regular rhythm. Grossly normal heart sounds.  Good peripheral circulation. Respiratory: Some increased respiratory effort, very diminished in the bases, wheezes auscultated diffusely, positive chest wall pain to the anterior chest no lesions no flail chest no crepitus tender to palpation however reproduces her pain Abdominal: Soft and nontender. No distention. No guarding no rebound Back:  There is no focal tenderness or step off.  there is no midline tenderness there are  no lesions noted. there is no CVA tenderness Musculoskeletal: No lower extremity tenderness,  no upper extremity tenderness. No joint effusions, no DVT signs strong distal pulses no edema Neurologic:  Normal speech and language. No gross focal neurologic deficits are appreciated.  Skin:  Skin is warm, dry and intact. No rash noted. Psychiatric: Mood and affect are normal. Speech and behavior are normal.  ____________________________________________   LABS (all labs ordered are listed, but only abnormal results are displayed)  Labs Reviewed  BASIC METABOLIC PANEL - Abnormal; Notable for the following components:      Result Value   Chloride 92 (*)    Glucose, Bld 245 (*)    BUN 33 (*)    Creatinine, Ser 6.58 (*)    GFR calc non Af Amer 6 (*)    GFR calc Af Amer 7 (*)    All other components within normal limits  CBC - Abnormal; Notable for the following components:   MCHC 31.7 (*)    RDW 19.8 (*)    All other components within normal limits  TROPONIN I - Abnormal; Notable for the following components:   Troponin I 0.04 (*)    All other components within normal limits  TROPONIN I    Pertinent labs  results that were available during my care of the patient were reviewed by me and considered in my medical decision making (see chart for details). ____________________________________________  EKG  I personally interpreted any EKGs ordered by me or triage Sinus rhythm left bundle branch block diffuse ST changes likely consistent with bundle branch block ____________________________________________  RADIOLOGY  Pertinent labs & imaging results that were available during my care of the patient were reviewed by me and considered in my medical decision making (see chart for details). If possible, patient and/or family made aware of any abnormal findings.  Ct Angio Chest Pe W And/or Wo Contrast  Result Date: 02/11/2018 CLINICAL DATA:  67 year old female presents with left-sided chest  pain radiating to the back x2 days worse with deep inspiration. Reports cough. EXAM: CT ANGIOGRAPHY CHEST WITH CONTRAST TECHNIQUE: Multidetector CT imaging of the chest was performed using the standard protocol during bolus administration of intravenous contrast. Multiplanar CT image reconstructions and MIPs were obtained to evaluate the vascular anatomy. CONTRAST:  75mL OMNIPAQUE IOHEXOL 350 MG/ML SOLN COMPARISON:  02/11/2018 CXR FINDINGS: Cardiovascular: Heart is top normal with aortic atherosclerosis. No aneurysm. Right-sided pacemaker apparatus with right atrial and right ventricular leads are noted. The patient is status post CABG. Moderate atherosclerosis of the great vessels with normal branch pattern off the thoracic aorta. No aortic aneurysm. No apparent dissection though study is tailored for the assessment of the pulmonary arterial system. No acute pulmonary embolus to the segmental branches. Dilatation of the main pulmonary artery to 3.5 cm consistent with changes of chronic pulmonary hypertension. Mediastinum/Nodes: Mild left lower paratracheal lymphadenopathy up to 19 mm short axis with smaller right lower paratracheal lymph nodes measuring up to 8 mm short axis. Patent trachea and mainstem bronchi. Esophagus is unremarkable. No thyromegaly or mass. Lungs/Pleura: No acute pulmonary consolidation, effusion or pneumothorax. No dominant mass. Upper Abdomen: No acute abnormality Musculoskeletal: Schmorl's nodes involving several upper thoracic vertebrae. No acute nor suspicious osseous abnormality. Review of the MIP images confirms the above findings. IMPRESSION: 1. Moderate thoracic aortic and branch vessel atherosclerosis. 2. Status post CABG with native coronary arteriosclerosis. 3. No active pulmonary disease.  No acute pulmonary embolus. 4. Mild mediastinal adenopathy, nonspecific possibly reactive in etiology. Aortic Atherosclerosis (ICD10-I70.0). Electronically Signed   By: Rene Kocheravid  Kwon M.D.  On:  02/11/2018 19:06   Dg Chest Port 1 View  Result Date: 02/11/2018 CLINICAL DATA:  Left-sided chest pain.  Shortness of breath. EXAM: PORTABLE CHEST 1 VIEW COMPARISON:  August 08, 2017 FINDINGS: Stable pacemaker. The heart size borderline. The hila and mediastinum are normal. No pulmonary nodules or masses. No focal infiltrates. IMPRESSION: No active disease. Electronically Signed   By: Gerome Sam III M.D   On: 02/11/2018 17:34   ____________________________________________    PROCEDURES  Procedure(s) performed: None  Procedures  Critical Care performed: CRITICAL CARE Performed by: Jeanmarie Plant   Total critical care time: 36 minutes  Critical care time was exclusive of separately billable procedures and treating other patients.  Critical care was necessary to treat or prevent imminent or life-threatening deterioration.  Critical care was time spent personally by me on the following activities: development of treatment plan with patient and/or surrogate as well as nursing, discussions with consultants, evaluation of patient's response to treatment, examination of patient, obtaining history from patient or surrogate, ordering and performing treatments and interventions, ordering and review of laboratory studies, ordering and review of radiographic studies, pulse oximetry and re-evaluation of patient's condition.   ____________________________________________   INITIAL IMPRESSION / ASSESSMENT AND PLAN / ED COURSE  Pertinent labs & imaging results that were available during my care of the patient were reviewed by me and considered in my medical decision making (see chart for details).  Patient with COPD symptoms and reducible chest wall pain.  Given the fact that her leg is in a boot, I did take the boot off and I do not see any evidence of a DVT but I did a CT scan because of persistent tachycardia and shortness of breath.  CT scan is unremarkable fortunately for PE.  She does  have a history of ACS she has more long troponin, I have given her nebulizers and steroids I do believe this is most likely COPD with acute flare with no evidence of infectious overtones.  After nebulizers patient's heart rate is somewhat up but her lungs, which initially improved, are again tight, I think she will need admission for COPD, I do not think antibiotics are necessarily indicated.  She is going to receive another nebulizer treatment here I will give her some IV fluid and we will reassess.  ----------------------------------------- 8:33 PM on 02/11/2018 -----------------------------------------  D/w dr. Excell Seltzer, of cardiology given ecg changes and trop. He agrees with asa and further observation hospitalist agrees with plan    ____________________________________________   FINAL CLINICAL IMPRESSION(S) / ED DIAGNOSES  Final diagnoses:  Chronic obstructive pulmonary disease, unspecified COPD type (HCC)  Atypical chest pain      This chart was dictated using voice recognition software.  Despite best efforts to proofread,  errors can occur which can change meaning.      Jeanmarie Plant, MD 02/11/18 Babette Relic    Jeanmarie Plant, MD 02/11/18 2034

## 2018-02-11 NOTE — ED Triage Notes (Signed)
Pt to triage in mild distress, reports left sided chest pain radiating to back x 2 days, reports is sharp, worse w/ deep inspiration, reports cough and hx of COPD.  Pt reports hx of triple CABG in 2017.  Pt reports sob, denies n/v.

## 2018-02-11 NOTE — ED Notes (Signed)
Patient transported to CT 

## 2018-02-11 NOTE — H&P (Signed)
[facility] [place] at [facility]   PATIENT NAME: Nichole Hurst    MR#:  [id number]  DATE OF BIRTH:  02-25-1951  DATE OF ADMISSION:  02/11/2018  PRIMARY CARE PHYSICIAN: [facility]   REQUESTING/REFERRING PHYSICIAN: Jeanmarie Plant, MD  CHIEF COMPLAINT:   CHEST PAIN ,SOB HISTORY OF PRESENT ILLNESS:  Nichole Hurst  is a 67 y.o. female with a known history of coronary artery disease status post CABG in July 2017, end-stage renal disease, last hemodialysis on Friday, history of COPD quit smoking, hyperlipidemia, non-insulin requiring diabetes mellitus and other medical problems is presenting to the ED with a chief complaint of chest pain and shortness of breath.  Initial EKG at 1651 minutes has revealed nonspecific interventricular block and a repeat EKG at 1800 has revealed a new left bundle branch block.  Initial troponin  0.04, repeat troponin 0 0.08 and patient is reporting reproducible left-sided anterior chest wall pain radiating to the shoulder blades and back.  Patient was given Solu-Medrol and bronchodilator treatments and aspirin.  Discussed with on-call cardiology Dr. Excell Seltzer who has recommended daily monitoring and no other interventions at this time.  PAST MEDICAL HISTORY:   Past Medical History:  Diagnosis Date  . Anemia of chronic disease   . CAD (coronary artery disease)    a. s/p 2-V CABG 05/2016 (LIMA-LAD, VG-OM1)  . Carotid stenosis   . Charcot's gait    FOOT  . Claustrophobia   . Colon polyp   . Diabetes mellitus with complication (HCC)   . Emphysema/COPD (HCC)   . ESRD (end stage renal disease) on dialysis (HCC)    DIAYLISIS M/W/F  . Fractures    Lt foot  . GERD (gastroesophageal reflux disease)   . Glaucoma   . History of kidney stones   . Hyperlipidemia   . Hypertension   . Migraine   . Orthopnea   . Oxygen deficiency    AS NEEDED  . PAD (peripheral artery disease) (HCC)   . Pain    JOINTS  . Presence of  permanent cardiac pacemaker    [employer]   . Retinopathy    DIABETIC  . Stroke [facility]) 09/2015   Ischemic stroke with R sided hemiparesis    PAST SURGICAL HISTOIRY:   Past Surgical History:  Procedure Laterality Date  . ABDOMINAL HYSTERECTOMY    . AMPUTATION TOE Left 11/29/2017   Procedure: AMPUTATION TOE-LEFT GREAT TOE;  Surgeon: Linus Galas, DPM;  Location: [facility] ORS;  Service: Podiatry;  Laterality: Left;  . AV FISTULA PLACEMENT     LEFT ARM  . CATARACT EXTRACTION W/PHACO Right 08/25/2016   Procedure: CATARACT EXTRACTION PHACO AND INTRAOCULAR LENS PLACEMENT (IOC) AND ANTERIOR VITRECTOMY;  Surgeon: Sallee Lange, MD;  Location: [facility] ORS;  Service: Ophthalmology;  Laterality: Right;  FLUID CASSETTE [id number] H, EXP 01/05/18  . CHOLECYSTECTOMY    . CORONARY ARTERY BYPASS GRAFT     05/13/2016  . INSERT / REPLACE / REMOVE PACEMAKER     6/17  . INSERTION OF AHMED VALVE Left 08/04/2017   Procedure: INSERTION OF AHMED VALVE;  Surgeon: Lockie Mola, MD;  Location: [facility] ORS;  Service: Ophthalmology;  Laterality: Left;  . LOWER EXTREMITY ANGIOGRAPHY Left 06/13/2017   Procedure: Lower Extremity Angiography;  Surgeon: Annice Needy, MD;  Location: [facility] INVASIVE CV LAB;  Service: Cardiovascular;  Laterality: Left;  . PACEMAKER IMPLANT    . PERIPHERAL VASCULAR CATHETERIZATION Left 04/08/2016   Procedure: A/V Shuntogram/Fistulagram;  Surgeon: Marlow Baars  Wyn Quaker, MD;  Location: [facility] INVASIVE CV LAB;  Service: Cardiovascular;  Laterality: Left;  . PERIPHERAL VASCULAR CATHETERIZATION N/A 04/08/2016   Procedure: A/V Shunt Intervention;  Surgeon: Annice Needy, MD;  Location: [facility] INVASIVE CV LAB;  Service: Cardiovascular;  Laterality: N/A;  . STONES     KIDNEY  . TONSILLECTOMY    . TUBAL LIGATION      SOCIAL HISTORY:   Social History   Tobacco Use  . Smoking status: Former Smoker    Last attempt to quit: 06/10/1983    Years since quitting: 34.6  . Smokeless tobacco: Former Engineer, water Use Topics  .  Alcohol use: No    FAMILY HISTORY:   Family History  Problem Relation Age of Onset  . Diabetes Mother   . Hypertension Mother   . Heart attack Mother     DRUG ALLERGIES:   Allergies  Allergen Reactions  . Colchicine Anaphylaxis  . Clopidogrel     REVIEW OF SYSTEMS:  CONSTITUTIONAL: No fever, fatigue or weakness.  EYES: No blurred or double vision.  EARS, NOSE, AND THROAT: No tinnitus or ear pain.  RESPIRATORY: No cough, shortness of breath, wheezing or hemoptysis.  CARDIOVASCULAR: reports chest pain, sob, no orthopnea, edema.  GASTROINTESTINAL: No nausea, vomiting, diarrhea or abdominal pain.  GENITOURINARY: No dysuria, hematuria.  ENDOCRINE: No polyuria, nocturia,  HEMATOLOGY: No anemia, easy bruising or bleeding SKIN: No rash or lesion. MUSCULOSKELETAL: No joint pain or arthritis.   NEUROLOGIC: No tingling, numbness, weakness.  PSYCHIATRY: No anxiety or depression.   MEDICATIONS AT HOME:   Prior to Admission medications   Medication Sig Start Date End Date Taking? Authorizing Provider  aspirin EC 81 MG tablet Take 81 mg by mouth at bedtime.   Yes [provider]  atorvastatin (LIPITOR) 80 MG tablet Take 80 mg by mouth every evening.   Yes [provider]  bimatoprost (LUMIGAN) 0.03 % ophthalmic solution Place 1 drop into the right eye at bedtime.    Yes [provider]  Brinzolamide-Brimonidine 1-0.2 % SUSP Apply 1 drop to eye 3 (three) times daily. LEFT EYE   Yes [provider]  calcium acetate (PHOSLO) 667 MG capsule Take 1,334 mg by mouth 3 (three) times daily with meals.   Yes [provider]  Difluprednate (DUREZOL) 0.05 % EMUL Place 1 drop into the left eye daily.    Yes [provider]  Ferrous Sulfate 140 (45 Fe) MG TBCR Take 1 tablet by mouth daily.   Yes [provider]  irbesartan (AVAPRO) 150 MG tablet Take 1 tablet (150 mg total) by mouth at bedtime. 08/09/17  Yes Enedina Finner, MD  losartan  (COZAAR) 100 MG tablet Take 100 mg by mouth at bedtime.   Yes [provider]  metoprolol succinate (TOPROL-XL) 25 MG 24 hr tablet Take 25 mg by mouth every evening.   Yes [provider]  moxifloxacin (VIGAMOX) 0.5 % ophthalmic solution Place 1 drop into the left eye 4 (four) times daily.   Yes [provider]  ticagrelor (BRILINTA) 60 MG TABS tablet Take 60 mg by mouth 2 (two) times daily.    Yes [provider]  timolol (BETIMOL) 0.5 % ophthalmic solution Place 1 drop into the left eye 2 (two) times daily.    Yes [provider]  Vitamin D, Ergocalciferol, (DRISDOL) 50000 units CAPS capsule Take 50,000 Units by mouth every 30 (thirty) days.   Yes [provider]  amoxicillin-clavulanate (AUGMENTIN) 500-125 MG tablet  Take 1 tablet by mouth 3 (three) times daily. Take 1 tablet by mouth once a day and administer additional dose by mouth during and at end of dialysis    [provider]  diclofenac sodium (VOLTAREN) 1 % GEL Apply topically 4 (four) times daily.    [provider]  diphenhydrAMINE (BENADRYL) 25 mg capsule Take 25 mg by mouth every 4 (four) hours as needed.    [provider]  Fluticasone Propionate (FLONASE NA) Place 2 puffs into the nose 2 (two) times daily as needed.     [provider]  HYDROcodone-acetaminophen (NORCO/VICODIN) 5-325 MG tablet Take 1 tablet by mouth every 4 (four) hours as needed for moderate pain. Patient not taking: Reported on 01/31/2018 11/30/17   Altamese Dilling, MD  Ipratropium-Albuterol (COMBIVENT) 20-100 MCG/ACT AERS respimat Inhale 2 puffs into the lungs every 6 (six) hours.    [provider]  loperamide (IMODIUM) 2 MG capsule Take by mouth as needed for diarrhea or loose stools.    [provider]  nitroGLYCERIN (NITROSTAT) 0.4 MG SL tablet Place 0.4 mg under the tongue every 5 (five) minutes as needed for chest pain.    [provider]   ondansetron (ZOFRAN) 4 MG tablet Take 4 mg by mouth every 8 (eight) hours as needed for nausea or vomiting.    [provider]  traMADol (ULTRAM) 50 MG tablet Take 1 tablet (50 mg total) by mouth 3 (three) times daily as needed for moderate pain or severe pain. Patient not taking: Reported on 01/31/2018 08/09/17   Enedina Finner, MD      VITAL SIGNS:  Blood pressure (!) 112/95, pulse 60, temperature 97.7 F (36.5 C), temperature source Oral, resp. rate 20, height 5\' 1"  (1.549 m), weight 64.9 kg (143 lb), SpO2 95 %.  PHYSICAL EXAMINATION:  GENERAL:  67 y.o.-year-old patient lying in the bed with no acute distress.  EYES: Pupils equal, round, reactive to light and accommodation. No scleral icterus. Extraocular muscles intact.  HEENT: Head atraumatic, normocephalic. Oropharynx and nasopharynx clear.  NECK:  Supple, no jugular venous distention. No thyroid enlargement, no tenderness.  LUNGS: Mod breath sounds bilaterally, min wheezing, no rales,rhonchi or crepitation. No use of accessory muscles of respiration.  CARDIOVASCULAR: Left-sided anterior chest wall tenderness which is reproducible S1, S2 normal. No murmurs, rubs, or gallops.   ABDOMEN: Soft, nontender, nondistended. Bowel sounds present. No organomegaly or mass.  EXTREMITIES: No pedal edema, cyanosis, or clubbing.  NEUROLOGIC: Cranial nerves II through XII are intact. Muscle strength weak  in all extremities. Sensation intact. Gait not checked.  PSYCHIATRIC: The patient is alert and oriented x 3.  SKIN: No obvious rash, lesion, or ulcer.   LABORATORY PANEL:   CBC Recent Labs  Lab 02/11/18 1659  WBC 6.2  HGB 12.8  HCT 40.2  PLT 198   ------------------------------------------------------------------------------------------------------------------  Chemistries  Recent Labs  Lab 02/11/18 1659  NA 136  K 4.6  CL 92*  CO2 32  GLUCOSE 245*  BUN 33*  CREATININE 6.58*  CALCIUM 9.7    ------------------------------------------------------------------------------------------------------------------  Cardiac Enzymes Recent Labs  Lab 02/11/18 1923  TROPONINI 0.08*   ------------------------------------------------------------------------------------------------------------------  RADIOLOGY:  Ct Angio Chest Pe W And/or Wo Contrast  Result Date: 02/11/2018 CLINICAL DATA:  67 year old female presents with left-sided chest pain radiating to the back x2 days worse with deep inspiration. Reports cough. EXAM: CT ANGIOGRAPHY CHEST WITH CONTRAST TECHNIQUE: Multidetector CT imaging of the chest was performed using the standard protocol during bolus  administration of intravenous contrast. Multiplanar CT image reconstructions and MIPs were obtained to evaluate the vascular anatomy. CONTRAST:  75mL OMNIPAQUE IOHEXOL 350 MG/ML SOLN COMPARISON:  02/11/2018 CXR FINDINGS: Cardiovascular: Heart is top normal with aortic atherosclerosis. No aneurysm. Right-sided pacemaker apparatus with right atrial and right ventricular leads are noted. The patient is status post CABG. Moderate atherosclerosis of the great vessels with normal branch pattern off the thoracic aorta. No aortic aneurysm. No apparent dissection though study is tailored for the assessment of the pulmonary arterial system. No acute pulmonary embolus to the segmental branches. Dilatation of the main pulmonary artery to 3.5 cm consistent with changes of chronic pulmonary hypertension. Mediastinum/Nodes: Mild left lower paratracheal lymphadenopathy up to 19 mm short axis with smaller right lower paratracheal lymph nodes measuring up to 8 mm short axis. Patent trachea and mainstem bronchi. Esophagus is unremarkable. No thyromegaly or mass. Lungs/Pleura: No acute pulmonary consolidation, effusion or pneumothorax. No dominant mass. Upper Abdomen: No acute abnormality Musculoskeletal: Schmorl's nodes involving several upper thoracic vertebrae. No  acute nor suspicious osseous abnormality. Review of the MIP images confirms the above findings. IMPRESSION: 1. Moderate thoracic aortic and branch vessel atherosclerosis. 2. Status post CABG with native coronary arteriosclerosis. 3. No active pulmonary disease.  No acute pulmonary embolus. 4. Mild mediastinal adenopathy, nonspecific possibly reactive in etiology. Aortic Atherosclerosis (ICD10-I70.0). Electronically Signed   By: Tollie Eth M.D.   On: 02/11/2018 19:06   Dg Chest Port 1 View  Result Date: 02/11/2018 CLINICAL DATA:  Left-sided chest pain.  Shortness of breath. EXAM: PORTABLE CHEST 1 VIEW COMPARISON:  August 08, 2017 FINDINGS: Stable pacemaker. The heart size borderline. The hila and mediastinum are normal. No pulmonary nodules or masses. No focal infiltrates. IMPRESSION: No active disease. Electronically Signed   By: Gerome Sam III M.D   On: 02/11/2018 17:34    EKG:   Orders placed or performed during the hospital encounter of 02/11/18  . EKG 12-Lead  . EKG 12-Lead  . ED EKG within 10 minutes  . ED EKG within 10 minutes  . EKG 12-Lead  . EKG 12-Lead    IMPRESSION AND PLAN:   Nichole Hurst  is a 66 y.o. female with a known history of coronary artery disease status post CABG in July 2017, end-stage renal disease, last hemodialysis on Friday, history of COPD quit smoking, hyperlipidemia, non-insulin requiring diabetes mellitus and other medical problems is presenting to the ED with a chief complaint of chest pain and shortness of breath.  Initial EKG at 1651 minutes has revealed nonspecific interventricular block and a repeat EKG at 1800 has revealed a new left bundle branch block.  Initial troponin  0.04, repeat troponin 0 0.08  #Acute respiratory distress from COPD exacerbation Admit to telemetry Solu-Medrol, bronchodilator treatments Azithromycin Oxygen as needed  #New left bundle branch block with chest pain with history of coronary artery disease Monitor patient on  telemetry Troponin 0 0.04-0.08 Cardiology consulted, given the history of end-stage renal disease Dr. Excell Seltzer has recommended to monitor the patient and cycle troponins.  No heparin drip recommended at this time  #Coronary artery disease status post CABG Continue aspirin, Brilinta, Lipitor, beta-blocker metoprolol, Cozaar and statin  #End-stage renal disease nephrology consulted for hemodialysis  #Type 2 diabetes mellitus-not on insulin provide sliding scale and Accu-Cheks   #Hyperlipidemia statin check fasting lipid panel  GI prophylaxis with Protonix and DVT prophylaxis with Lovenox subcu  All the records are reviewed and case discussed with ED provider. Management plans  discussed with the patient, family and they are in agreement.  CODE STATUS: FC   TOTAL TIME TAKING CARE OF THIS PATIENT: 42 minutes.   Note: This dictation was prepared with Dragon dictation along with smaller phrase technology. Any transcriptional errors that result from this process are unintentional.  Ramonita LabAruna Militza Devery M.D on 02/11/2018 at 9:21 PM  Between 7am to 6pm - Pager - [phone]  After 6pm go to [email or web address] - password [email or web address] [facility]  [facility]  Office  [phone]  CC: Primary care physician; [facility]

## 2018-02-11 NOTE — Progress Notes (Signed)
Family Meeting Note  Advance Directive:yes  Today a meeting took place with the Patient.    The following clinical team members were present during this meeting:MD  The following were discussed:Patient's diagnosis: Acute respiratory distress, COPD exacerbation, chest pain with new onset left bundle branch block on EKG, elevated troponin, diabetes metas, hyperlipidemia, end-stage renal disease on hemodialysis, treatment plan of care was discussed in detail with the patient.  She verbalized understanding of the plan., Patient's progosis: Unable to determine and Goals for treatment: Full Code,  All 3 daughters HCPOA  Additional follow-up to be provided: hospitalist , cardiology, nephrology  Time spent during discussion:18 min  Ramonita LabAruna Cottrell Gentles, MD

## 2018-02-11 NOTE — Progress Notes (Signed)
Pharmacy Lovenox Dosing  67 y.o. female admitted with Chest Pain . Patient ordered Lovenox 30 mg daily for VTE prophylaxis.   Filed Weights   02/11/18 1651 02/11/18 2111  Weight: 143 lb (64.9 kg) 147 lb 14.9 oz (67.1 kg)    Body mass index is 27.95 kg/m.  Estimated Creatinine Clearance: 7.4 mL/min (A) (by C-G formula based on SCr of 6.58 mg/dL (H)).  Will convert patient to subcutaneous heparin 5000 units tid for CrCl < 15 ml/min.  Valentina GuChristy, Jaquawn Saffran D 02/11/2018 9:53 PM

## 2018-02-11 NOTE — ED Notes (Signed)
[phone] [facility]

## 2018-02-12 ENCOUNTER — Encounter: Payer: Self-pay | Admitting: Physician Assistant

## 2018-02-12 DIAGNOSIS — I214 Non-ST elevation (NSTEMI) myocardial infarction: Principal | ICD-10-CM

## 2018-02-12 LAB — HEPARIN LEVEL (UNFRACTIONATED)
Heparin Unfractionated: <0.1 IU/mL — ABNORMAL LOW (ref 0.30–0.70)
Heparin Unfractionated: 0.3 IU/mL (ref 0.30–0.70)

## 2018-02-12 LAB — BASIC METABOLIC PANEL
Anion gap: 18 — ABNORMAL HIGH (ref 5–15)
BUN: 41 mg/dL — AB (ref 6–20)
CHLORIDE: 92 mmol/L — AB (ref 101–111)
CO2: 25 mmol/L (ref 22–32)
Calcium: 9.3 mg/dL (ref 8.9–10.3)
Creatinine, Ser: 7.09 mg/dL — ABNORMAL HIGH (ref 0.44–1.00)
GFR calc Af Amer: 6 mL/min — ABNORMAL LOW (ref >60)
GFR calc non Af Amer: 5 mL/min — ABNORMAL LOW (ref >60)
Glucose, Bld: 326 mg/dL — ABNORMAL HIGH (ref 65–99)
POTASSIUM: 4.9 mmol/L (ref 3.5–5.1)
SODIUM: 135 mmol/L (ref 135–145)

## 2018-02-12 LAB — CBC
HEMATOCRIT: 39.7 % (ref 35.0–47.0)
HEMOGLOBIN: 12.4 g/dL (ref 12.0–16.0)
MCH: 27.8 pg (ref 26.0–34.0)
MCHC: 31.3 g/dL — AB (ref 32.0–36.0)
MCV: 88.8 fL (ref 80.0–100.0)
Platelets: 171 10*3/uL (ref 150–440)
RBC: 4.47 MIL/uL (ref 3.80–5.20)
RDW: 20.4 % — ABNORMAL HIGH (ref 11.5–14.5)
WBC: 5.3 10*3/uL (ref 3.6–11.0)

## 2018-02-12 LAB — GLUCOSE, CAPILLARY
GLUCOSE-CAPILLARY: 370 mg/dL — AB (ref 65–99)
GLUCOSE-CAPILLARY: 401 mg/dL — AB (ref 65–99)
GLUCOSE-CAPILLARY: 402 mg/dL — AB (ref 65–99)
Glucose-Capillary: 286 mg/dL — ABNORMAL HIGH (ref 65–99)

## 2018-02-12 LAB — TROPONIN I
TROPONIN I: 0.69 ng/mL — AB (ref <0.03)
Troponin I: 3.09 ng/mL (ref <0.03)
Troponin I: 5.8 ng/mL (ref <0.03)
Troponin I: 6.02 ng/mL (ref <0.03)

## 2018-02-12 LAB — PROTIME-INR
INR: 1.08
Prothrombin Time: 13.9 seconds (ref 11.4–15.2)

## 2018-02-12 LAB — HEMOGLOBIN A1C
HEMOGLOBIN A1C: 8.1 % — AB (ref 4.8–5.6)
MEAN PLASMA GLUCOSE: 185.77 mg/dL

## 2018-02-12 LAB — APTT: APTT: 30 s (ref 24–36)

## 2018-02-12 MED ORDER — LEVALBUTEROL HCL 1.25 MG/0.5ML IN NEBU
1.2500 mg | INHALATION_SOLUTION | Freq: Four times a day (QID) | RESPIRATORY_TRACT | Status: DC
  Administered 2018-02-12 – 2018-02-17 (×13): 1.25 mg via RESPIRATORY_TRACT
  Filled 2018-02-12 (×21): qty 0.5

## 2018-02-12 MED ORDER — MORPHINE SULFATE (PF) 2 MG/ML IV SOLN
2.0000 mg | Freq: Once | INTRAVENOUS | Status: DC
  Filled 2018-02-12: qty 1

## 2018-02-12 MED ORDER — HEPARIN BOLUS VIA INFUSION
4000.0000 [IU] | Freq: Once | INTRAVENOUS | Status: AC
  Administered 2018-02-12: 4000 [IU] via INTRAVENOUS
  Filled 2018-02-12: qty 4000

## 2018-02-12 MED ORDER — HYDROMORPHONE HCL 1 MG/ML IJ SOLN
0.5000 mg | Freq: Once | INTRAMUSCULAR | Status: AC
  Administered 2018-02-12: 0.5 mg via INTRAVENOUS
  Filled 2018-02-12: qty 0.5

## 2018-02-12 MED ORDER — INSULIN REGULAR HUMAN 100 UNIT/ML IJ SOLN
10.0000 [IU] | Freq: Once | INTRAMUSCULAR | Status: AC
  Administered 2018-02-12: 10 [IU] via INTRAVENOUS
  Filled 2018-02-12: qty 0.1

## 2018-02-12 MED ORDER — ALPRAZOLAM 0.5 MG PO TABS
0.5000 mg | ORAL_TABLET | Freq: Two times a day (BID) | ORAL | Status: DC | PRN
  Administered 2018-02-12 – 2018-02-17 (×10): 0.5 mg via ORAL
  Filled 2018-02-12 (×9): qty 1

## 2018-02-12 MED ORDER — METOPROLOL SUCCINATE ER 25 MG PO TB24
25.0000 mg | ORAL_TABLET | Freq: Two times a day (BID) | ORAL | Status: DC
  Administered 2018-02-13 (×2): 25 mg via ORAL
  Filled 2018-02-12 (×4): qty 1

## 2018-02-12 MED ORDER — HEPARIN (PORCINE) IN NACL 100-0.45 UNIT/ML-% IJ SOLN
1200.0000 [IU]/h | INTRAMUSCULAR | Status: DC
  Administered 2018-02-12: 800 [IU]/h via INTRAVENOUS
  Administered 2018-02-13: 950 [IU]/h via INTRAVENOUS
  Administered 2018-02-14: 1200 [IU]/h via INTRAVENOUS
  Filled 2018-02-12 (×4): qty 250

## 2018-02-12 MED ORDER — HYDROMORPHONE HCL 1 MG/ML IJ SOLN
0.2500 mg | Freq: Once | INTRAMUSCULAR | Status: AC
  Administered 2018-02-12: 0.25 mg via INTRAVENOUS
  Filled 2018-02-12: qty 0.5

## 2018-02-12 MED ORDER — INSULIN ASPART 100 UNIT/ML ~~LOC~~ SOLN
0.0000 [IU] | Freq: Three times a day (TID) | SUBCUTANEOUS | Status: DC
  Administered 2018-02-13: 5 [IU] via SUBCUTANEOUS
  Administered 2018-02-13 (×2): 9 [IU] via SUBCUTANEOUS
  Administered 2018-02-14: 5 [IU] via SUBCUTANEOUS
  Filled 2018-02-12 (×4): qty 1

## 2018-02-12 MED ORDER — ALUM & MAG HYDROXIDE-SIMETH 200-200-20 MG/5ML PO SUSP
30.0000 mL | ORAL | Status: DC | PRN
  Administered 2018-02-13: 30 mL via ORAL
  Filled 2018-02-12: qty 30

## 2018-02-12 MED ORDER — HYDROCOD POLST-CPM POLST ER 10-8 MG/5ML PO SUER
5.0000 mL | Freq: Every evening | ORAL | Status: DC | PRN
  Administered 2018-02-12: 5 mL via ORAL
  Filled 2018-02-12: qty 5

## 2018-02-12 NOTE — Progress Notes (Signed)
[facility]  ROUNDING NOTE   Subjective:   [name] admitted to [facility] on 02/11/2018 for Atypical chest pain [R07.89] Chest pain [R07.9] Chronic obstructive pulmonary disease, unspecified COPD type (HCC) [J44.9]   Objective:  Vital signs in last 24 hours:  Temp:  [97.4 F (36.3 C)-98.4 F (36.9 C)] 97.4 F (36.3 C) (04/07 0521) Pulse Rate:  [60-128] 106 (04/07 0521) Resp:  [15-24] 20 (04/06 2111) BP: (112-176)/(70-98) 160/87 (04/07 0521) SpO2:  [83 %-100 %] 98 % (04/07 0747) Weight:  [64.9 kg (143 lb)-67.1 kg (147 lb 14.9 oz)] 67.1 kg (147 lb 14.9 oz) (04/06 2111)  Weight change:  Filed Weights   02/11/18 1651 02/11/18 2111  Weight: 64.9 kg (143 lb) 67.1 kg (147 lb 14.9 oz)    Intake/Output: No intake/output data recorded.   Intake/Output this shift:  Total I/O In: 120 [P.O.:120] Out: -   Physical Exam: General: NAD, sitting   Head: Normocephalic, atraumatic. Moist oral mucosal membranes  Eyes: Anicteric, PERRL  Neck: Supple, trachea midline  Lungs:  Clear to auscultation  Heart: Regular rate and rhythm  Abdomen:  Soft, nontender,   Extremities: no peripheral edema. Left first toe amputation  Neurologic: Nonfocal, moving all four extremities  Skin: Healing left first toe ulcer  Access: Left AVF    Basic Metabolic Panel: Recent Labs  Lab 02/11/18 1659 02/11/18 2136 02/12/18 0338  NA 136  --  135  K 4.6  --  4.9  CL 92*  --  92*  CO2 32  --  25  GLUCOSE 245*  --  326*  BUN 33*  --  41*  CREATININE 6.58* 6.70* 7.09*  CALCIUM 9.7  --  9.3    Liver Function Tests: No results for input(s): AST, ALT, ALKPHOS, BILITOT, PROT, ALBUMIN in the last 168 hours. No results for input(s): LIPASE, AMYLASE in the last 168 hours. No results for input(s): AMMONIA in the last 168 hours.  CBC: Recent Labs  Lab 02/11/18 1659 02/11/18 2136 02/12/18 0338  WBC 6.2 5.8 5.3  HGB 12.8 12.7 12.4  HCT 40.2 39.1 39.7  MCV 88.2 88.3 88.8  PLT 198 179  171    Cardiac Enzymes: Recent Labs  Lab 02/11/18 1659 02/11/18 1923 02/11/18 2136 02/12/18 0338 02/12/18 0907  TROPONINI 0.04* 0.08* 0.10* 0.69* 3.09*    BNP: Invalid input(s): POCBNP  CBG: Recent Labs  Lab 02/11/18 2151 02/12/18 0751 02/12/18 1145  GLUCAP 303* 370* 401*    Microbiology: Results for orders placed or performed during the hospital encounter of 11/28/17  Surgical PCR screen     Status: None   Collection Time: 11/28/17  5:37 PM  Result Value Ref Range Status   MRSA, PCR NEGATIVE NEGATIVE Final   Staphylococcus aureus NEGATIVE NEGATIVE Final    Comment: (NOTE) The Xpert SA Assay (FDA approved for NASAL specimens in patients 52 years of age and older), is one component of a comprehensive surveillance program. It is not intended to diagnose infection nor to guide or monitor treatment. Performed at [facility], [address] [place], [place] [address]   Aerobic/Anaerobic Culture (surgical/deep wound)     Status: None   Collection Time: 11/29/17  1:33 PM  Result Value Ref Range Status   Specimen Description   Final    BONE LEFT TOE GREAT TOE Performed at [facility], [address] [place], [place] [address]    Special Requests   Final    NONE Performed at  [facility], [address] [place], [place] [address]    Gram Stain   Final    FEW WBC PRESENT, PREDOMINANTLY PMN RARE GRAM POSITIVE COCCI    Culture   Final    FEW KLEBSIELLA PNEUMONIAE FEW STAPHYLOCOCCUS AUREUS NO ANAEROBES ISOLATED Performed at [facility], [address] [place], [place] [address]    Report Status 12/04/2017 FINAL  Final   Organism ID, Bacteria KLEBSIELLA PNEUMONIAE  Final   Organism ID, Bacteria STAPHYLOCOCCUS AUREUS  Final      Susceptibility   Klebsiella pneumoniae - MIC*    AMPICILLIN RESISTANT Resistant     CEFAZOLIN <=4 SENSITIVE Sensitive     CEFEPIME <=1 SENSITIVE Sensitive     CEFTAZIDIME <=1 SENSITIVE Sensitive      CEFTRIAXONE <=1 SENSITIVE Sensitive     CIPROFLOXACIN <=0.25 SENSITIVE Sensitive     GENTAMICIN <=1 SENSITIVE Sensitive     IMIPENEM <=0.25 SENSITIVE Sensitive     TRIMETH/SULFA <=20 SENSITIVE Sensitive     AMPICILLIN/SULBACTAM 4 SENSITIVE Sensitive     PIP/TAZO <=4 SENSITIVE Sensitive     Extended ESBL NEGATIVE Sensitive     * FEW KLEBSIELLA PNEUMONIAE   Staphylococcus aureus - MIC*    CIPROFLOXACIN <=0.5 SENSITIVE Sensitive     ERYTHROMYCIN <=0.25 SENSITIVE Sensitive     GENTAMICIN <=0.5 SENSITIVE Sensitive     OXACILLIN 0.5 SENSITIVE Sensitive     TETRACYCLINE <=1 SENSITIVE Sensitive     VANCOMYCIN 1 SENSITIVE Sensitive     TRIMETH/SULFA <=10 SENSITIVE Sensitive     CLINDAMYCIN <=0.25 SENSITIVE Sensitive     RIFAMPIN <=0.5 SENSITIVE Sensitive     Inducible Clindamycin NEGATIVE Sensitive     * FEW STAPHYLOCOCCUS AUREUS    Coagulation Studies: No results for input(s): LABPROT, INR in the last 72 hours.  Urinalysis: No results for input(s): COLORURINE, LABSPEC, PHURINE, GLUCOSEU, HGBUR, BILIRUBINUR, KETONESUR, PROTEINUR, UROBILINOGEN, NITRITE, LEUKOCYTESUR in the last 72 hours.  Invalid input(s): APPERANCEUR    Imaging: Ct Angio Chest Pe W And/or Wo Contrast  Result Date: 02/11/2018 CLINICAL DATA:  67 year old female presents with left-sided chest pain radiating to the back x2 days worse with deep inspiration. Reports cough. EXAM: CT ANGIOGRAPHY CHEST WITH CONTRAST TECHNIQUE: Multidetector CT imaging of the chest was performed using the standard protocol during bolus administration of intravenous contrast. Multiplanar CT image reconstructions and MIPs were obtained to evaluate the vascular anatomy. CONTRAST:  75mL OMNIPAQUE IOHEXOL 350 MG/ML SOLN COMPARISON:  02/11/2018 CXR FINDINGS: Cardiovascular: Heart is top normal with aortic atherosclerosis. No aneurysm. Right-sided pacemaker apparatus with right atrial and right ventricular leads are noted. The patient is status post CABG.  Moderate atherosclerosis of the great vessels with normal branch pattern off the thoracic aorta. No aortic aneurysm. No apparent dissection though study is tailored for the assessment of the pulmonary arterial system. No acute pulmonary embolus to the segmental branches. Dilatation of the main pulmonary artery to 3.5 cm consistent with changes of chronic pulmonary hypertension. Mediastinum/Nodes: Mild left lower paratracheal lymphadenopathy up to 19 mm short axis with smaller right lower paratracheal lymph nodes measuring up to 8 mm short axis. Patent trachea and mainstem bronchi. Esophagus is unremarkable. No thyromegaly or mass. Lungs/Pleura: No acute pulmonary consolidation, effusion or pneumothorax. No dominant mass. Upper Abdomen: No acute abnormality Musculoskeletal: Schmorl's nodes involving several upper thoracic vertebrae. No acute nor suspicious osseous abnormality. Review of the MIP images confirms the above findings. IMPRESSION: 1. Moderate thoracic aortic and branch vessel atherosclerosis. 2.  Status post CABG with native coronary arteriosclerosis. 3. No active pulmonary disease.  No acute pulmonary embolus. 4. Mild mediastinal adenopathy, nonspecific possibly reactive in etiology. Aortic Atherosclerosis (ICD10-I70.0). Electronically Signed   By: Tollie Eth M.D.   On: 02/11/2018 19:06   Dg Chest Port 1 View  Result Date: 02/11/2018 CLINICAL DATA:  Left-sided chest pain.  Shortness of breath. EXAM: PORTABLE CHEST 1 VIEW COMPARISON:  August 08, 2017 FINDINGS: Stable pacemaker. The heart size borderline. The hila and mediastinum are normal. No pulmonary nodules or masses. No focal infiltrates. IMPRESSION: No active disease. Electronically Signed   By: Gerome Sam III M.D   On: 02/11/2018 17:34     Medications:    . aspirin EC  81 mg Oral QHS  . atorvastatin  80 mg Oral QPM  . azithromycin  500 mg Oral Daily  . calcium acetate  1,334 mg Oral TID WC  . Difluprednate  1 drop Left Eye Daily   . docusate sodium  100 mg Oral BID  . ferrous sulfate  325 mg Oral Q breakfast  . heparin injection (subcutaneous)  5,000 Units Subcutaneous Q8H  . insulin aspart  0-5 Units Subcutaneous QHS  . insulin aspart  0-9 Units Subcutaneous TID WC  . latanoprost  1 drop Both Eyes QHS  . levalbuterol  1.25 mg Nebulization Q6H  . losartan  100 mg Oral QHS  . methylPREDNISolone (SOLU-MEDROL) injection  60 mg Intravenous Q6H  . metoprolol succinate  25 mg Oral QPM  . moxifloxacin  1 drop Left Eye QID  . pantoprazole  40 mg Oral Daily  . ticagrelor  60 mg Oral BID  . timolol  1 drop Left Eye BID   albuterol, chlorpheniramine-HYDROcodone, diphenhydrAMINE, fluticasone, nitroGLYCERIN, ondansetron **OR** ondansetron (ZOFRAN) IV, ondansetron, oxyCODONE, traMADol  Assessment/ Plan:  [name] is a 67 y.o. white female with end stage renal disease on hemodialysis, coronary artery disease, hypertension, congestive heart failure, CVA, pulmonary hypertension, peripheral arterial disease, hyperlipidemia, GERD, glaucoma, COPD, left first toe amputation.   MWF [facility] [employer]   1. End Stage Renal Disease: on hemodialysis MWF. Last treatment was Friday.  - Schedule hemodialysis for tomorrow. Resume MWF schedule.   2. Hypertension: with chest pain and elevated blood pressures. Newly diagnosed left bundle branch block - Appreciate cardiology input.  - metoprolol, losartan  3. Anemia of chronic kidney disease: hemoglobin within normal limits.  - Continue Mircera as outpatient  4. Secondary Hyperparathyroidism:  - Calcium acetate with meals.    LOS: 1 Nichole Hurst 4/7/201912:12 PM

## 2018-02-12 NOTE — Consult Note (Addendum)
Cardiology Consultation:   Patient ID: Nichole Hurst; [id number]; [birth year]   Admit date: 02/11/2018 Date of Consult: 02/12/2018  Primary Care Provider: [facility], [facility] Primary Cardiologist: [facility] Primary Electrophysiologist:  [facility]   Patient Profile:   Nichole Hurst is a 67 y.o. female with a hx of CAD s/p 2-vessel CABG ([id number]) in 05/2016, tachy-brady syndrome s/p SJM PPM in 05/2016, ESRD on HD (MWF), chronic diastolic CHF, pulmonary hypertension, PAD s/p angioplasty of the left PTA/left ATA, anemia of chronic disease, DM2, HTN, and HLD who is being seen today for the evaluation of elevated troponin and LBBB at the request of Dr. Amado Coe, MD.  History of Present Illness:   [name] underwent 2-vessel CABG in 05/2016 at [facility] in the setting of undergoing a diagnostic LHC for pre-transplant cardiac evaluation that showed severe 2-vessel CAD as detailed below. Post-procedure course was complicated by tachy-brady syndrome requiring SJM PPM. She was admitted to [facility] in 08/2017 for acute hypoxic respiratory failure due to CHF exacerbation and uncontrolled hypertension. Echo at that time showed EF 55-60%, no RWMA, Gr2DD, mild to moderate MR, mildly dilated left atrium, mildly dilated RV with normal RVSF, mildly dilated RA, severe TR, PASP 60-65 mmHg. She underwent emergent dialysis along with titration of antihypertensives with improvement in symptoms. She has not followed up with cardiology since that admission. 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   She developed worsening SOB with associated cough on 4/5. This was followed by the development of left-sided chest pain that is reproducible to palpation and worse with deep inspiration and coughing. Pain is not exertional. Cough is productive of clear sputum. She has not missed any dialysis sessions. No orthopnea or lower extremity  swelling. Because of her worsening SOB and chest pain she presented to [facility]   Upon the patient's [facility] they were found to have BP 176/70, HR 115 bpm, temp 98.4, oxygen saturation 100% on room air, weight 143 pounds. EKG showed sinus tachycardia with a new LBBB, CXR showed no active disease. CTA chest was negative for PE, showing moderate thoracic aortic and branch vessel atherosclerosis along with native coronary atherosclerosis s/p CABG and mild mediastinal adenopathy. Labs showed troponin of 0.08-->0.10-->0.69, SCr 6.70-->7.09, glucose 326, K+ 4.9, WBC 5.8, HGB 12.7, PLT 179. Upon her admission she was felt to have a COPD exacerbation and treated with ABX, steroids, and nebs per IM. She was given ASA 324 mg x 1 and has required several narcotic pain medications. Cardiology was asked to evaluate given her new LBBB and mildly elevated troponin. Currently, continues to note cough and SOB. Chest pain is exacerbated by coughing and deep inspiration. Chest is painful when she presses on her chest and fully reproduces her pain.   Past Medical History:  Diagnosis Date  . Anemia of chronic disease   . CAD (coronary artery disease)    a. s/p 2-V CABG 05/2016 (LIMA-LAD, VG-OM1)  . Carotid stenosis   . Charcot's gait    FOOT  . Claustrophobia   . Colon polyp   . Diabetes mellitus with complication (HCC)   . Emphysema/COPD (HCC)   . ESRD (end stage renal disease) on dialysis (HCC)    DIAYLISIS M/W/F  . Fractures    Lt foot  . GERD (gastroesophageal reflux disease)   . Glaucoma   . History of kidney stones   . Hyperlipidemia   .  Hypertension   . Migraine   . Orthopnea   . Oxygen deficiency    AS NEEDED  . PAD (peripheral artery disease) (HCC)   . Pain    JOINTS  . Presence of permanent cardiac pacemaker    St. Jude   . Pulmonary hypertension (HCC)    a. TTE 10/18: EF 55-60%, no RWMA, Gr2DD, mild to moderate MR, mildly dilated left atrium, mildly dilated RV with normal RVSF, mildly  dilated RA, severe TR, PASP 60-65 mmHg  . Retinopathy    DIABETIC  . Stroke [facility]) 09/2015   Ischemic stroke with R sided hemiparesis    Past Surgical History:  Procedure Laterality Date  . ABDOMINAL HYSTERECTOMY    . AMPUTATION TOE Left 11/29/2017   Procedure: AMPUTATION TOE-LEFT GREAT TOE;  Surgeon: Linus Galas, DPM;  Location: [facility] ORS;  Service: Podiatry;  Laterality: Left;  . AV FISTULA PLACEMENT     LEFT ARM  . CATARACT EXTRACTION W/PHACO Right 08/25/2016   Procedure: CATARACT EXTRACTION PHACO AND INTRAOCULAR LENS PLACEMENT (IOC) AND ANTERIOR VITRECTOMY;  Surgeon: Sallee Lange, MD;  Location: [facility] ORS;  Service: Ophthalmology;  Laterality: Right;  FLUID CASSETTE [id number] H, EXP 01/05/18  . CHOLECYSTECTOMY    . CORONARY ARTERY BYPASS GRAFT     05/13/2016  . INSERT / REPLACE / REMOVE PACEMAKER     6/17  . INSERTION OF AHMED VALVE Left 08/04/2017   Procedure: INSERTION OF AHMED VALVE;  Surgeon: Lockie Mola, MD;  Location: [facility] ORS;  Service: Ophthalmology;  Laterality: Left;  . LOWER EXTREMITY ANGIOGRAPHY Left 06/13/2017   Procedure: Lower Extremity Angiography;  Surgeon: Annice Needy, MD;  Location: [facility] INVASIVE CV LAB;  Service: Cardiovascular;  Laterality: Left;  . PACEMAKER IMPLANT    . PERIPHERAL VASCULAR CATHETERIZATION Left 04/08/2016   Procedure: A/V Shuntogram/Fistulagram;  Surgeon: Annice Needy, MD;  Location: [facility] INVASIVE CV LAB;  Service: Cardiovascular;  Laterality: Left;  . PERIPHERAL VASCULAR CATHETERIZATION N/A 04/08/2016   Procedure: A/V Shunt Intervention;  Surgeon: Annice Needy, MD;  Location: [facility] INVASIVE CV LAB;  Service: Cardiovascular;  Laterality: N/A;  . STONES     KIDNEY  . TONSILLECTOMY    . TUBAL LIGATION       Home Meds: Prior to Admission medications   Medication Sig Start Date End Date Taking? Authorizing Provider  aspirin EC 81 MG tablet Take 81 mg by mouth at bedtime.   Yes [provider]  atorvastatin (LIPITOR) 80 MG tablet Take  80 mg by mouth every evening.   Yes [provider]  bimatoprost (LUMIGAN) 0.03 % ophthalmic solution Place 1 drop into the right eye at bedtime.    Yes [provider]  Brinzolamide-Brimonidine 1-0.2 % SUSP Apply 1 drop to eye 3 (three) times daily. LEFT EYE   Yes [provider]  calcium acetate (PHOSLO) 667 MG capsule Take 1,334 mg by mouth 3 (three) times daily with meals.   Yes [provider]  Difluprednate (DUREZOL) 0.05 % EMUL Place 1 drop into the left eye daily.    Yes [provider]  Ferrous Sulfate 140 (45 Fe) MG TBCR Take 1 tablet by mouth daily.   Yes [provider]  irbesartan (AVAPRO) 150 MG tablet Take 1 tablet (150 mg total) by mouth at bedtime. 08/09/17  Yes Enedina Finner, MD  losartan (COZAAR) 100 MG tablet Take 100 mg by mouth at bedtime.   Yes [provider]  metoprolol succinate (TOPROL-XL) 25 MG 24 hr tablet Take  25 mg by mouth every evening.   Yes [provider]  moxifloxacin (VIGAMOX) 0.5 % ophthalmic solution Place 1 drop into the left eye 4 (four) times daily.   Yes [provider]  ticagrelor (BRILINTA) 60 MG TABS tablet Take 60 mg by mouth 2 (two) times daily.    Yes [provider]  timolol (BETIMOL) 0.5 % ophthalmic solution Place 1 drop into the left eye 2 (two) times daily.    Yes [provider]  Vitamin D, Ergocalciferol, (DRISDOL) 50000 units CAPS capsule Take 50,000 Units by mouth every 30 (thirty) days.   Yes [provider]  amoxicillin-clavulanate (AUGMENTIN) 500-125 MG tablet Take 1 tablet by mouth 3 (three) times daily. Take 1 tablet by mouth once a day and administer additional dose by mouth during and at end of dialysis    [provider]  diclofenac sodium (VOLTAREN) 1 % GEL Apply topically 4 (four) times daily.    [provider]  diphenhydrAMINE (BENADRYL) 25 mg capsule Take 25 mg by mouth every 4 (four) hours as needed.     [provider]  Fluticasone Propionate (FLONASE NA) Place 2 puffs into the nose 2 (two) times daily as needed.     [provider]  HYDROcodone-acetaminophen (NORCO/VICODIN) 5-325 MG tablet Take 1 tablet by mouth every 4 (four) hours as needed for moderate pain. Patient not taking: Reported on 01/31/2018 11/30/17   Altamese Dilling, MD  Ipratropium-Albuterol (COMBIVENT) 20-100 MCG/ACT AERS respimat Inhale 2 puffs into the lungs every 6 (six) hours.    [provider]  loperamide (IMODIUM) 2 MG capsule Take by mouth as needed for diarrhea or loose stools.    [provider]  nitroGLYCERIN (NITROSTAT) 0.4 MG SL tablet Place 0.4 mg under the tongue every 5 (five) minutes as needed for chest pain.    [provider]  ondansetron (ZOFRAN) 4 MG tablet Take 4 mg by mouth every 8 (eight) hours as needed for nausea or vomiting.    [provider]  traMADol (ULTRAM) 50 MG tablet Take 1 tablet (50 mg total) by mouth 3 (three) times daily as needed for moderate pain or severe pain. Patient not taking: Reported on 01/31/2018 08/09/17   Enedina Finner, MD    Inpatient Medications: Scheduled Meds: . aspirin EC  81 mg Oral QHS  . atorvastatin  80 mg Oral QPM  . azithromycin  500 mg Oral Daily  . calcium acetate  1,334 mg Oral TID WC  . Difluprednate  1 drop Left Eye Daily  . docusate sodium  100 mg Oral BID  . ferrous sulfate  325 mg Oral Q breakfast  . heparin injection (subcutaneous)  5,000 Units Subcutaneous Q8H  . insulin aspart  0-5 Units Subcutaneous QHS  . insulin aspart  0-9 Units Subcutaneous TID WC  . latanoprost  1 drop Both Eyes QHS  . levalbuterol  1.25 mg Nebulization Q6H  . losartan  100 mg Oral QHS  . methylPREDNISolone (SOLU-MEDROL) injection  60 mg Intravenous Q6H  . metoprolol succinate  25 mg Oral QPM  . moxifloxacin  1 drop Left Eye QID  . pantoprazole  40 mg Oral Daily  . ticagrelor  60 mg Oral BID  . timolol  1 drop Left Eye  BID   Continuous Infusions:  PRN Meds: albuterol, chlorpheniramine-HYDROcodone, diphenhydrAMINE, fluticasone, nitroGLYCERIN, ondansetron **OR** ondansetron (ZOFRAN) IV, ondansetron, oxyCODONE, traMADol  Allergies:   Allergies  Allergen Reactions  . Colchicine Anaphylaxis  . Clopidogrel  Social History:   Social History   Socioeconomic History  . Marital status: Widowed    Spouse name: Not on file  . Number of children: Not on file  . Years of education: Not on file  . Highest education level: Not on file  Occupational History  . Not on file  Social Needs  . Financial resource strain: Not on file  . Food insecurity:    Worry: Not on file    Inability: Not on file  . Transportation needs:    Medical: Not on file    Non-medical: Not on file  Tobacco Use  . Smoking status: Former Smoker    Last attempt to quit: 06/10/1983    Years since quitting: 34.7  . Smokeless tobacco: Former Engineer, water and Sexual Activity  . Alcohol use: No  . Drug use: No  . Sexual activity: Not on file  Lifestyle  . Physical activity:    Days per week: Not on file    Minutes per session: Not on file  . Stress: Not on file  Relationships  . Social connections:    Talks on phone: Not on file    Gets together: Not on file    Attends religious service: Not on file    Active member of club or organization: Not on file    Attends meetings of clubs or organizations: Not on file    Relationship status: Not on file  . Intimate partner violence:    Fear of current or ex partner: Not on file    Emotionally abused: Not on file    Physically abused: Not on file    Forced sexual activity: Not on file  Other Topics Concern  . Not on file  Social History Narrative  . Not on file     Family History:   Family History  Problem Relation Age of Onset  . Diabetes Mother   . Hypertension Mother   . Heart attack Mother     ROS:  Review of Systems  Constitutional: Positive for  malaise/fatigue. Negative for chills, diaphoresis, fever and weight loss.  HENT: Negative for congestion.   Eyes: Negative for discharge and redness.  Respiratory: Positive for cough, sputum production, shortness of breath and wheezing. Negative for hemoptysis.        Clear sputum  Cardiovascular: Positive for chest pain. Negative for palpitations, orthopnea, claudication, leg swelling and PND.  Gastrointestinal: Negative for abdominal pain, blood in stool, heartburn, melena, nausea and vomiting.  Genitourinary: Negative for hematuria.  Musculoskeletal: Negative for falls and myalgias.  Skin: Negative for rash.  Neurological: Positive for weakness. Negative for dizziness, tingling, tremors, sensory change, speech change, focal weakness and loss of consciousness.  Endo/Heme/Allergies: Does not bruise/bleed easily.  Psychiatric/Behavioral: Negative for substance abuse. The patient is not nervous/anxious.   All other systems reviewed and are negative.     Physical Exam/Data:   Vitals:   02/11/18 2111 02/11/18 2117 02/12/18 0521 02/12/18 0747  BP: (!) 112/95  (!) 160/87   Pulse: (!) 128 60 (!) 106   Resp: 20     Temp: 97.7 F (36.5 C)  (!) 97.4 F (36.3 C)   TempSrc: Oral     SpO2: (!) 83% 95% 97% 98%  Weight: 147 lb 14.9 oz (67.1 kg)     Height: 5\' 1"  (1.549 m)       Intake/Output Summary (Last 24 hours) at 02/12/2018 1610 Last data filed at 02/12/2018 0550 Gross per 24 hour  Intake 0 ml  Output 0 ml  Net 0 ml   Filed Weights   02/11/18 1651 02/11/18 2111  Weight: 143 lb (64.9 kg) 147 lb 14.9 oz (67.1 kg)   Body mass index is 27.95 kg/m.   Physical Exam: General: Well developed, well nourished, in no acute distress. Head: Normocephalic, atraumatic, sclera non-icteric, no xanthomas, nares without discharge.   Neck: Negative for carotid bruits. JVD not elevated. Lungs: Diminished breath sounds bilaterally with diffuse rhonchi and wheezing. Breathing is unlabored. Heart:  Tachycardic, with S1 S2. II/VI systolic murmur at the apex, no rubs, or gallops appreciated. Palpation of the the anterior chest wall along the left side fully reproduces her pain.  Abdomen: Soft, non-tender, non-distended with normoactive bowel sounds. No hepatomegaly. No rebound/guarding. No obvious abdominal masses. Msk:  Strength and tone appear normal for age. Extremities: No clubbing or cyanosis. No edema. Distal pedal pulses are 2+ and equal bilaterally. Neuro: Alert and oriented X 3. No facial asymmetry. No focal deficit. Moves all extremities spontaneously. Psych:  Responds to questions appropriately with a normal affect.   EKG:  The EKG was personally reviewed and demonstrates: sinus tachycardia 117 bpm, LBBB (new); repeat EKG showed sinus tachycardia, 106 bpm, LBBB Telemetry:  Telemetry was personally reviewed and demonstrates: sinus tachycardia, low 100s bpm, LBBB  Weights: Filed Weights   02/11/18 1651 02/11/18 2111  Weight: 143 lb (64.9 kg) 147 lb 14.9 oz (67.1 kg)    Relevant CV Studies: TTE 08/2017: Study Conclusions  - Left ventricle: The cavity size was normal. Wall thickness was   increased in a pattern of moderate LVH. Systolic function was   normal. The estimated ejection fraction was in the range of 55%   to 60%. Wall motion was normal; there were no regional wall   motion abnormalities. Features are consistent with a pseudonormal   left ventricular filling pattern, with concomitant abnormal   relaxation and increased filling pressure (grade 2 diastolic   dysfunction). Doppler parameters are consistent with high   ventricular filling pressure. - Mitral valve: Calcified annulus. Mildly thickened, mildly   calcified leaflets . Mitral valve gradient was mildly elevated.   There was mild to moderate regurgitation. - Left atrium: The atrium was mildly dilated. - Right ventricle: The cavity size was mildly dilated. Systolic   function was normal. - Right atrium:  The atrium was mildly dilated. - Tricuspid valve: There was severe regurgitation. - Pulmonary arteries: Systolic pressure was moderately increased,   in the range of 60 mm Hg to 65 mm Hg.  LHC 08/2015: HEMODYNAMICS: Aortic pressure was 120/70; LV pressure was 125; LVEDP 12.  There was no gradient between the left ventricle and aorta.   ANGIOGRAM/CORONARY ARTERIOGRAM: The left main coronary artery is 0.  The left anterior descending artery is 70-80. DIFFUSELY DIABETIC  The left circumflex artery is 70-80. DIABETIC DISEASE DIFFUSELY  The right coronary artery is SMALL NONDOMINANT - OCCLUDED.  LEFT VENTRICULOGRAM: Left ventricular angiogram was done in the 30 RAO  projection and revealed normal left ventricular wall motion and systolic  function with an estimated ejection fraction of 60%. LVEDP was 14 mmHg.  IMPRESSION OF HEART CATHETERIZATION:  1. MULTIVESSEL SEVERE DISEASE WITH CX DOMINANCE - CABG VS SO DIFFUSE MAY  BE BEST TREATED MEDICALLY AS SHE IS ASYMPTOMATIC. 2. Normal left ventricular systolic function. LVEDP 14 mmHg. Ejection  fraction 60%.  Laboratory Data:  Chemistry Recent Labs  Lab 02/11/18 1659 02/11/18 2136 02/12/18 0338  NA 136  --  135  K 4.6  --  4.9  CL 92*  --  92*  CO2 32  --  25  GLUCOSE 245*  --  326*  BUN 33*  --  41*  CREATININE 6.58* 6.70* 7.09*  CALCIUM 9.7  --  9.3  GFRNONAA 6* 6* 5*  GFRAA 7* 7* 6*  ANIONGAP 12  --  18*    No results for input(s): PROT, ALBUMIN, AST, ALT, ALKPHOS, BILITOT in the last 168 hours. Hematology Recent Labs  Lab 02/11/18 1659 02/11/18 2136 02/12/18 0338  WBC 6.2 5.8 5.3  RBC 4.56 4.43 4.47  HGB 12.8 12.7 12.4  HCT 40.2 39.1 39.7  MCV 88.2 88.3 88.8  MCH 27.9 28.6 27.8  MCHC 31.7* 32.4 31.3*  RDW 19.8* 20.0* 20.4*  PLT 198 179 171   Cardiac Enzymes Recent Labs  Lab 02/11/18 1659 02/11/18 1923 02/11/18 2136 02/12/18 0338  TROPONINI 0.04* 0.08* 0.10* 0.69*   No results for input(s): TROPIPOC in  the last 168 hours.  BNPNo results for input(s): BNP, PROBNP in the last 168 hours.  DDimer No results for input(s): DDIMER in the last 168 hours.  Radiology/Studies:  Ct Angio Chest Pe W And/or Wo Contrast  Result Date: 02/11/2018 IMPRESSION: 1. Moderate thoracic aortic and branch vessel atherosclerosis. 2. Status post CABG with native coronary arteriosclerosis. 3. No active pulmonary disease.  No acute pulmonary embolus. 4. Mild mediastinal adenopathy, nonspecific possibly reactive in etiology. Aortic Atherosclerosis (ICD10-I70.0). Electronically Signed   By: Tollie Eth M.D.   On: 02/11/2018 19:06   Dg Chest Port 1 View  Result Date: 02/11/2018 IMPRESSION: No active disease. Electronically Signed   By: Gerome Sam III M.D   On: 02/11/2018 17:34    Assessment and Plan:   1. Elevated troponin/LBBB/chest pain: -Chest pain is fairly atypical, worse with coughing, deep inspiration, and reproducible to palpation on exam -Mild troponin bump with a current peak of 0.69, continue to cycle until peak -Likely supply demand ischemia in the setting of her AECOPD, ESRD, and accelerated hypertension -Echo pending -May benefit from [facility] once acute illness is improved   2. CAD s/p CABG: -As above -ASA -Aggressive risk factor modification  3. Chronic diastolic CHF/pulmonary HTN: -She does not appear to be grossly volume overloaded at this time -Volume managed by HD -Pulmonary hypertension likely in the setting of her pulmonary disease -Consider outpatient evaluation with cardiac cath (if she follows up)  4. ESRD: -On HD per nephrology   5. Accelerated HTN: -Readings somewhat labile, if pressures remain elevated, consider adding amlodipine  -Seems to be tolerating metoprolol  -Losartan   For questions or updates, please contact [facility] Please consult [email or web address] for contact info under Cardiology/STEMI.   Signed, Eula Listen, PA-C [facility] Pager: [phone] 02/12/2018, 9:21 AM  Patient seen, examined. Available data reviewed. Agree with findings, assessment, and plan as outlined by Eula Listen, PA-C. On my exam: Vitals:   02/12/18 0521 02/12/18 0747  BP: (!) 160/87   Pulse: (!) 106   Resp:    Temp: (!) 97.4 F (36.3 C)   SpO2: 97% 98%   Pt is alert and oriented, anxious, in NAD HEENT: normal Neck: JVP - normal, carotids 2+= without bruits Lungs: diminished breath sounds bilaterally with prolonged expiratory phase CV: tachy, regular, no murmur Abd: soft, NT, Positive BS, no hepatomegaly Ext: no C/C/E, distal pulses intact and equal Skin: warm/dry no rash  EKG shows sinus tachycardia with left bundle branch block pattern.  The pacemaker spikes are not visible on her EKG, but review of telemetry demonstrates sinus tachycardia with ventricular pacing (atrial sensed).  The patient's exam is pertinent for exquisite tenderness over her chest even to light touch such as a stethoscope.  While her pain is highly atypical, it is concerning that her troponin is increasing which is more suggestive of acute coronary syndrome.  I think she should be started on IV heparin.  Symptoms might be related to demand ischemia.  The patient is being treated for a COPD exacerbation and she feels very anxious with steroids.  Will write her for an anxiolytic medication.  Will adjust her medical therapy as tolerated with increasing her metoprolol succinate dose.  The patient is on dual antiplatelet therapy with aspirin and ticagrelor.  Will cycle enzymes.  We will check a 2D echocardiogram.  Further plans/disposition from a cardiac perspective will depend on the results of her continued enzyme evaluation and echocardiogram.  Tonny BollmanMichael Odell Choung, M.D. 02/12/2018 1:07 PM

## 2018-02-12 NOTE — Progress Notes (Signed)
Hospitialist on-call repaged for elevated glucose. Awaiting callback. Windy Carinaurner,Goldye Tourangeau K, RN 9:46 PM 02/12/2018

## 2018-02-12 NOTE — Progress Notes (Signed)
Troponin 5.8 @ 1334 tday, Lab did not call me w/value.  I saw it in chart now and have texed Dr. Excell Seltzerooper w/results.

## 2018-02-12 NOTE — Progress Notes (Signed)
Hospitalist on-call paged for CBG of 402 per MD order. Awaiting callback. Windy Carinaurner,Kairyn Olmeda K, RN 9:35 PM 02/12/2018

## 2018-02-12 NOTE — Consult Note (Signed)
ANTICOAGULATION CONSULT NOTE - Initial Consult  Pharmacy Consult for Heparin Drip  Indication: chest pain/ACS  Allergies  Allergen Reactions  . Colchicine Anaphylaxis  . Clopidogrel    Patient Measurements: Height: 5\' 1"  (154.9 cm) Weight: 147 lb 14.9 oz (67.1 kg) IBW/kg (Calculated) : 47.8  Vital Signs: Temp: 97.4 F (36.3 C) (04/07 0521) BP: 160/87 (04/07 0521) Pulse Rate: 106 (04/07 0521)  Labs: Recent Labs    02/11/18 1659  02/11/18 2136 02/12/18 0338 02/12/18 0907  HGB 12.8  --  12.7 12.4  --   HCT 40.2  --  39.1 39.7  --   PLT 198  --  179 171  --   CREATININE 6.58*  --  6.70* 7.09*  --   TROPONINI 0.04*   < > 0.10* 0.69* 3.09*   < > = values in this interval not displayed.    Estimated Creatinine Clearance: 6.8 mL/min (A) (by C-G formula based on SCr of 7.09 mg/dL (H)).   Medical History: Past Medical History:  Diagnosis Date  . Anemia of chronic disease   . CAD (coronary artery disease)    a. s/p 2-V CABG 05/2016 (LIMA-LAD, VG-OM1)  . Carotid stenosis   . Charcot's gait    FOOT  . Claustrophobia   . Colon polyp   . Diabetes mellitus with complication (HCC)   . Emphysema/COPD (HCC)   . ESRD (end stage renal disease) on dialysis (HCC)    DIAYLISIS M/W/F  . Fractures    Lt foot  . GERD (gastroesophageal reflux disease)   . Glaucoma   . History of kidney stones   . Hyperlipidemia   . Hypertension   . Migraine   . Orthopnea   . Oxygen deficiency    AS NEEDED  . PAD (peripheral artery disease) (HCC)   . Pain    JOINTS  . Presence of permanent cardiac pacemaker    St. Jude   . Pulmonary hypertension (HCC)    a. TTE 10/18: EF 55-60%, no RWMA, Gr2DD, mild to moderate MR, mildly dilated left atrium, mildly dilated RV with normal RVSF, mildly dilated RA, severe TR, PASP 60-65 mmHg  . Retinopathy    DIABETIC  . Stroke [facility]) 09/2015   Ischemic stroke with R sided hemiparesis   Assessment: Pharmacy consulted for heparin drip dosing and monitoring  in 67yo female for ACS/NSTEMI.  Troponin 3.09.  Goal of Therapy:  Heparin level 0.3-0.7 units/ml Monitor platelets by anticoagulation protocol: Yes   Plan:  Baseline labs ordered Give 4000 units bolus x 1 Start heparin infusion at 800 units/hr Check anti-Xa level in 6 hours and daily while on heparin Continue to monitor H&H and platelets  Gardner CandleSheema M Yarlin Breisch, PharmD, BCPS Clinical Pharmacist 67/05/2018 1:29 PM

## 2018-02-12 NOTE — Progress Notes (Signed)
Dr. Excell Seltzerooper called me back shortly after I texted him w/latest Troponin (5.8) and asked me if pt was having CP, I was in Pt's room, she denied CP.  No new orders received.

## 2018-02-12 NOTE — Progress Notes (Signed)
Pt admitted to unit with COPD exacerbation and chest pains, Pt also complaining of back pain. MD notified and pain med ordered and admin. Pt sleeping at this time. Pt Troponin trending up, from 0.04 to 0.69 at 03:38. MD notified. Pt monitored by TELE. Will continue to monitor.

## 2018-02-12 NOTE — Progress Notes (Signed)
[employer] - [place] at [facility]                                                                                                                                                                                  Patient Demographics   Nichole Hurst, is a 67 y.o. female, DOB - [birth year], ZOX:[id number]  Admit date - 02/11/2018   Admitting Physician Ramonita Lab, MD  Outpatient Primary MD for the patient is [facility], [facility]   LOS - 1  Subjective: Patient seen and evaluated today Has some chest tightness on deep breathing Has occasional cough and wheezing No fever and chills She is on dialysis on Monday Wednesday and Friday   Review of Systems:   CONSTITUTIONAL: No documented fever. No fatigue, weakness. No weight gain, no weight loss.  EYES: No blurry or double vision.  ENT: No tinnitus. No postnasal drip. No redness of the oropharynx.  RESPIRATORY: Has cough, has wheezing, no hemoptysis. No dyspnea.  CARDIOVASCULAR: Has chest tightness. No orthopnea. No palpitations. No syncope.  GASTROINTESTINAL: No nausea, no vomiting or diarrhea. No abdominal pain. No melena or hematochezia.  GENITOURINARY: No dysuria or hematuria.  ENDOCRINE: No polyuria or nocturia. No heat or cold intolerance.  HEMATOLOGY: No anemia. No bruising. No bleeding.  INTEGUMENTARY: No rashes. No lesions.  MUSCULOSKELETAL: No arthritis. No swelling. No gout.  NEUROLOGIC: No numbness, tingling, or ataxia. No seizure-type activity.  PSYCHIATRIC: No anxiety. No insomnia. No ADD.    Vitals:   Vitals:   02/11/18 2111 02/11/18 2117 02/12/18 0521 02/12/18 0747  BP: (!) 112/95  (!) 160/87   Pulse: (!) 128 60 (!) 106   Resp: 20     Temp: 97.7 F (36.5 C)  (!) 97.4 F (36.3 C)   TempSrc: Oral     SpO2: (!) 83% 95% 97% 98%  Weight: 67.1 kg (147 lb 14.9 oz)     Height: 5\' 1"  (1.549 m)       Wt Readings from Last 3 Encounters:  02/11/18 67.1 kg (147 lb 14.9 oz)  01/31/18 66.7 kg (147  lb)  11/30/17 70.3 kg (154 lb 15.7 oz)     Intake/Output Summary (Last 24 hours) at 02/12/2018 1305 Last data filed at 02/12/2018 0900 Gross per 24 hour  Intake 120 ml  Output 0 ml  Net 120 ml    Physical Exam:   GENERAL: Pleasant-appearing in no apparent distress.  HEAD, EYES, EARS, NOSE AND THROAT: Atraumatic, normocephalic. Extraocular muscles are intact. Pupils equal and reactive to light. Sclerae anicteric. No conjunctival injection. No oro-pharyngeal erythema.  NECK: Supple. There is no jugular venous distention. No bruits, no lymphadenopathy, no thyromegaly.  HEART: Regular rate and rhythm,. No murmurs, no rubs, no clicks.  LUNGS: Decreased airflow in both lungs, bilateral wheezing noted ABDOMEN: Soft, flat, nontender, nondistended. Has good bowel sounds. No hepatosplenomegaly appreciated.  EXTREMITIES: No evidence of any cyanosis, clubbing, or peripheral edema.  +2 pedal and radial pulses bilaterally.  NEUROLOGIC: The patient is alert, awake, and oriented x3 with no focal motor or sensory deficits appreciated bilaterally.  SKIN: Moist and warm with no rashes appreciated.  Psych: Not anxious, depressed LN: No inguinal LN enlargement    Antibiotics   Anti-infectives (From admission, onward)   Start     Dose/Rate Route Frequency Ordered Stop   02/11/18 2130  azithromycin (ZITHROMAX) tablet 500 mg     500 mg Oral Daily 02/11/18 2127 02/14/18 0959      Medications   Scheduled Meds: . aspirin EC  81 mg Oral QHS  . atorvastatin  80 mg Oral QPM  . azithromycin  500 mg Oral Daily  . calcium acetate  1,334 mg Oral TID WC  . Difluprednate  1 drop Left Eye Daily  . docusate sodium  100 mg Oral BID  . ferrous sulfate  325 mg Oral Q breakfast  . heparin injection (subcutaneous)  5,000 Units Subcutaneous Q8H  . insulin aspart  0-5 Units Subcutaneous QHS  . insulin aspart  0-9 Units Subcutaneous TID WC  . latanoprost  1 drop Both Eyes QHS  . levalbuterol  1.25 mg Nebulization  Q6H  . losartan  100 mg Oral QHS  . methylPREDNISolone (SOLU-MEDROL) injection  60 mg Intravenous Q6H  . metoprolol succinate  25 mg Oral QPM  . moxifloxacin  1 drop Left Eye QID  . pantoprazole  40 mg Oral Daily  . ticagrelor  60 mg Oral BID  . timolol  1 drop Left Eye BID   Continuous Infusions: PRN Meds:.albuterol, chlorpheniramine-HYDROcodone, diphenhydrAMINE, fluticasone, nitroGLYCERIN, ondansetron **OR** ondansetron (ZOFRAN) IV, ondansetron, oxyCODONE, traMADol   Data Review:   Micro Results No results found for this or any previous visit (from the past 240 hour(s)).  Radiology Reports Ct Angio Chest Pe W And/or Wo Contrast  Result Date: 02/11/2018 CLINICAL DATA:  67 year old female presents with left-sided chest pain radiating to the back x2 days worse with deep inspiration. Reports cough. EXAM: CT ANGIOGRAPHY CHEST WITH CONTRAST TECHNIQUE: Multidetector CT imaging of the chest was performed using the standard protocol during bolus administration of intravenous contrast. Multiplanar CT image reconstructions and MIPs were obtained to evaluate the vascular anatomy. CONTRAST:  75mL OMNIPAQUE IOHEXOL 350 MG/ML SOLN COMPARISON:  02/11/2018 CXR FINDINGS: Cardiovascular: Heart is top normal with aortic atherosclerosis. No aneurysm. Right-sided pacemaker apparatus with right atrial and right ventricular leads are noted. The patient is status post CABG. Moderate atherosclerosis of the great vessels with normal branch pattern off the thoracic aorta. No aortic aneurysm. No apparent dissection though study is tailored for the assessment of the pulmonary arterial system. No acute pulmonary embolus to the segmental branches. Dilatation of the main pulmonary artery to 3.5 cm consistent with changes of chronic pulmonary hypertension. Mediastinum/Nodes: Mild left lower paratracheal lymphadenopathy up to 19 mm short axis with smaller right lower paratracheal lymph nodes measuring up to 8 mm short axis.  Patent trachea and mainstem bronchi. Esophagus is unremarkable. No thyromegaly or mass. Lungs/Pleura: No acute pulmonary consolidation, effusion or pneumothorax. No dominant mass. Upper Abdomen: No acute abnormality Musculoskeletal: Schmorl's nodes involving several upper thoracic vertebrae. No acute nor suspicious osseous abnormality. Review of the MIP images confirms  the above findings. IMPRESSION: 1. Moderate thoracic aortic and branch vessel atherosclerosis. 2. Status post CABG with native coronary arteriosclerosis. 3. No active pulmonary disease.  No acute pulmonary embolus. 4. Mild mediastinal adenopathy, nonspecific possibly reactive in etiology. Aortic Atherosclerosis (ICD10-I70.0). Electronically Signed   By: Tollie Ethavid  Kwon M.D.   On: 02/11/2018 19:06   Dg Chest Port 1 View  Result Date: 02/11/2018 CLINICAL DATA:  Left-sided chest pain.  Shortness of breath. EXAM: PORTABLE CHEST 1 VIEW COMPARISON:  August 08, 2017 FINDINGS: Stable pacemaker. The heart size borderline. The hila and mediastinum are normal. No pulmonary nodules or masses. No focal infiltrates. IMPRESSION: No active disease. Electronically Signed   By: Gerome Samavid  Williams III M.D   On: 02/11/2018 17:34     CBC Recent Labs  Lab 02/11/18 1659 02/11/18 2136 02/12/18 0338  WBC 6.2 5.8 5.3  HGB 12.8 12.7 12.4  HCT 40.2 39.1 39.7  PLT 198 179 171  MCV 88.2 88.3 88.8  MCH 27.9 28.6 27.8  MCHC 31.7* 32.4 31.3*  RDW 19.8* 20.0* 20.4*    Chemistries  Recent Labs  Lab 02/11/18 1659 02/11/18 2136 02/12/18 0338  NA 136  --  135  K 4.6  --  4.9  CL 92*  --  92*  CO2 32  --  25  GLUCOSE 245*  --  326*  BUN 33*  --  41*  CREATININE 6.58* 6.70* 7.09*  CALCIUM 9.7  --  9.3   ------------------------------------------------------------------------------------------------------------------ estimated creatinine clearance is 6.8 mL/min (A) (by C-G formula based on SCr of 7.09 mg/dL  (H)). ------------------------------------------------------------------------------------------------------------------ Recent Labs    02/12/18 0338  HGBA1C 8.1*   ------------------------------------------------------------------------------------------------------------------ No results for input(s): CHOL, HDL, LDLCALC, TRIG, CHOLHDL, LDLDIRECT in the last 72 hours. ------------------------------------------------------------------------------------------------------------------ No results for input(s): TSH, T4TOTAL, T3FREE, THYROIDAB in the last 72 hours.  Invalid input(s): FREET3 ------------------------------------------------------------------------------------------------------------------ No results for input(s): VITAMINB12, FOLATE, FERRITIN, TIBC, IRON, RETICCTPCT in the last 72 hours.  Coagulation profile No results for input(s): INR, PROTIME in the last 168 hours.  No results for input(s): DDIMER in the last 72 hours.  Cardiac Enzymes Recent Labs  Lab 02/11/18 2136 02/12/18 0338 02/12/18 0907  TROPONINI 0.10* 0.69* 3.09*   ------------------------------------------------------------------------------------------------------------------ Invalid input(s): POCBNP    Assessment & Plan   67 year old female patient with history of end-stage renal disease on dialysis, COPD, hyperlipidemia, non-insulin-dependent diabetes mellitus, coronary artery disease, CABG currently under hospitalist service for COPD exacerbation and elevated troponin and chest tightness.  1.  Elevated troponin with left bundle branch block on EKG with chest tightness Status post cardiology evaluation Most likely supply demand ischemia in the setting of end-stage renal disease and COPD exacerbation and elevated blood pressure Follow-up echocardiogram Cycle troponin Continue aspirin, Brilinta, Lipitor, beta-blocker Cozaar and statin  2.  Diastolic congestive heart failure with pulmonary  hypertension Volume control by hemodialysis  3.  End-stage renal disease on dialysis Appreciate nephrology follow-up  4.  Acute COPD exacerbation Continue IV Solu-Medrol bronchodilator treatments Zithromax antibiotic Oxygen via nasal cannula       Code Status Orders  (From admission, onward)        Start     Ordered   02/11/18 2127  Full code  Continuous     02/11/18 2127    Code Status History    Date Active Date Inactive Code Status Order ID Comments User Context   11/28/2017 1726 11/30/2017 2143 Full Code [id number]  Altamese DillingVachhani, Vaibhavkumar, MD Inpatient   08/08/2017 1036 08/09/2017 1855 Full  Code [id number]  Enedina Finner, MD Inpatient   06/13/2017 1100 06/13/2017 1602 Full Code [id number]  Annice Needy, MD Inpatient      Time Spent in minutes   35  Greater than 50% of time spent in care coordination and counseling patient regarding the condition and plan of care.   Ihor Austin M.D on 02/12/2018 at 1:05 PM  Between 7am to 6pm - Pager - [phone]  After 6pm go to [email or web address] - Social research officer, government  [employer]   Office  [phone]

## 2018-02-12 NOTE — Progress Notes (Signed)
Dr. Anne HahnWillis notified of elevated glucose; also noted elevated Troponin; acknowledged; new orders written to adjust for Solumedrol doses and treat immediate elevation; will also reorder serial Troponins. Windy Carinaurner,Baldemar Dady K, RN 9:52 PM 02/12/2018

## 2018-02-12 NOTE — Consult Note (Signed)
ANTICOAGULATION CONSULT NOTE - Initial Consult  Pharmacy Consult for Heparin Drip  Indication: chest pain/ACS  Allergies  Allergen Reactions  . Colchicine Anaphylaxis  . Clopidogrel    Patient Measurements: Height: 5\' 1"  (154.9 cm) Weight: 147 lb 14.9 oz (67.1 kg) IBW/kg (Calculated) : 47.8  Vital Signs: Temp: 97.6 F (36.4 C) (04/07 2044) Temp Source: Oral (04/07 2044) BP: 92/65 (04/07 2044) Pulse Rate: 93 (04/07 2044)  Labs: Recent Labs    02/11/18 1659  02/11/18 2136 02/12/18 0338 02/12/18 0907 02/12/18 1334 02/12/18 2037  HGB 12.8  --  12.7 12.4  --   --   --   HCT 40.2  --  39.1 39.7  --   --   --   PLT 198  --  179 171  --   --   --   APTT  --   --   --   --   --  30  --   LABPROT  --   --   --   --   --  13.9  --   INR  --   --   --   --   --  1.08  --   HEPARINUNFRC  --   --   --   --   --  <0.10* 0.30  CREATININE 6.58*  --  6.70* 7.09*  --   --   --   TROPONINI 0.04*   < > 0.10* 0.69* 3.09* 5.80*  --    < > = values in this interval not displayed.    Estimated Creatinine Clearance: 6.8 mL/min (A) (by C-G formula based on SCr of 7.09 mg/dL (H)).   Medical History: Past Medical History:  Diagnosis Date  . Anemia of chronic disease   . CAD (coronary artery disease)    a. s/p 2-V CABG 05/2016 (LIMA-LAD, VG-OM1)  . Carotid stenosis   . Charcot's gait    FOOT  . Claustrophobia   . Colon polyp   . Diabetes mellitus with complication (HCC)   . Emphysema/COPD (HCC)   . ESRD (end stage renal disease) on dialysis (HCC)    DIAYLISIS M/W/F  . Fractures    Lt foot  . GERD (gastroesophageal reflux disease)   . Glaucoma   . History of kidney stones   . Hyperlipidemia   . Hypertension   . Migraine   . Orthopnea   . Oxygen deficiency    AS NEEDED  . PAD (peripheral artery disease) (HCC)   . Pain    JOINTS  . Presence of permanent cardiac pacemaker    St. Jude   . Pulmonary hypertension (HCC)    a. TTE 10/18: EF 55-60%, no RWMA, Gr2DD, mild to  moderate MR, mildly dilated left atrium, mildly dilated RV with normal RVSF, mildly dilated RA, severe TR, PASP 60-65 mmHg  . Retinopathy    DIABETIC  . Stroke [facility](HCC) 09/2015   Ischemic stroke with R sided hemiparesis   Assessment: Pharmacy consulted for heparin drip dosing and monitoring in 66yo female for ACS/NSTEMI.  Troponin 3.09.  Goal of Therapy:  Heparin level 0.3-0.7 units/ml Monitor platelets by anticoagulation protocol: Yes   Plan:  Will increase heparin infusion to 850 units/hr and recheck a HL in 6 hours.   Valentina Guhristy, Emoni Yang D, PharmD, BCPS Clinical Pharmacist 02/12/2018 8:56 PM

## 2018-02-13 ENCOUNTER — Inpatient Hospital Stay (HOSPITAL_COMMUNITY)
Admit: 2018-02-13 | Discharge: 2018-02-13 | Disposition: A | Discharge status: Home / Self Care | Payer: No Typology Code available for payment source | Attending: Physician Assistant | Admitting: Physician Assistant

## 2018-02-13 DIAGNOSIS — I361 Nonrheumatic tricuspid (valve) insufficiency: Secondary | ICD-10-CM

## 2018-02-13 DIAGNOSIS — I340 Nonrheumatic mitral (valve) insufficiency: Secondary | ICD-10-CM

## 2018-02-13 LAB — GLUCOSE, CAPILLARY
GLUCOSE-CAPILLARY: 375 mg/dL — AB (ref 65–99)
GLUCOSE-CAPILLARY: 267 mg/dL — AB (ref 65–99)
GLUCOSE-CAPILLARY: 353 mg/dL — AB (ref 65–99)
Glucose-Capillary: 244 mg/dL — ABNORMAL HIGH (ref 65–99)
Glucose-Capillary: 321 mg/dL — ABNORMAL HIGH (ref 65–99)
Glucose-Capillary: 346 mg/dL — ABNORMAL HIGH (ref 65–99)
Glucose-Capillary: 370 mg/dL — ABNORMAL HIGH (ref 65–99)

## 2018-02-13 LAB — TROPONIN I
Troponin I: 6.93 ng/mL (ref <0.03)
Troponin I: 5.62 ng/mL (ref <0.03)

## 2018-02-13 LAB — HEPARIN LEVEL (UNFRACTIONATED)
Heparin Unfractionated: 0.24 IU/mL — ABNORMAL LOW (ref 0.30–0.70)
Heparin Unfractionated: 0.23 IU/mL — ABNORMAL LOW (ref 0.30–0.70)

## 2018-02-13 LAB — CBC
HEMATOCRIT: 36.9 % (ref 35.0–47.0)
Hemoglobin: 11.9 g/dL — ABNORMAL LOW (ref 12.0–16.0)
MCH: 28.3 pg (ref 26.0–34.0)
MCHC: 32.1 g/dL (ref 32.0–36.0)
MCV: 88.3 fL (ref 80.0–100.0)
Platelets: 186 10*3/uL (ref 150–440)
RBC: 4.18 MIL/uL (ref 3.80–5.20)
RDW: 20.2 % — ABNORMAL HIGH (ref 11.5–14.5)
WBC: 11.8 10*3/uL — AB (ref 3.6–11.0)

## 2018-02-13 LAB — ECHOCARDIOGRAM COMPLETE
Height: 61 in
WEIGHTICAEL: 2366.86 [oz_av]

## 2018-02-13 MED ORDER — NEPRO/CARBSTEADY PO LIQD: 237.0000 mL | Freq: Two times a day (BID) | ORAL | Status: DC

## 2018-02-13 MED ORDER — VITAMIN C 500 MG PO TABS
250.0000 mg | ORAL_TABLET | Freq: Two times a day (BID) | ORAL | Status: DC
  Administered 2018-02-15 – 2018-02-17 (×5): 250 mg via ORAL
  Filled 2018-02-13: qty 0.5
  Filled 2018-02-13: qty 1
  Filled 2018-02-13 (×7): qty 0.5

## 2018-02-13 MED ORDER — HEPARIN BOLUS VIA INFUSION
950.0000 [IU] | Freq: Once | INTRAVENOUS | Status: AC
  Administered 2018-02-13: 950 [IU] via INTRAVENOUS
  Filled 2018-02-13: qty 950

## 2018-02-13 MED ORDER — INSULIN GLARGINE 100 UNIT/ML ~~LOC~~ SOLN
8.0000 [IU] | Freq: Every day | SUBCUTANEOUS | Status: DC
  Administered 2018-02-13: 8 [IU] via SUBCUTANEOUS
  Filled 2018-02-13 (×3): qty 0.08

## 2018-02-13 MED ORDER — SODIUM CHLORIDE 0.9% FLUSH: 3.0000 mL | INTRAVENOUS | Status: DC | PRN

## 2018-02-13 MED ORDER — HEPARIN BOLUS VIA INFUSION
900.0000 [IU] | Freq: Once | INTRAVENOUS | Status: AC
  Administered 2018-02-13: 900 [IU] via INTRAVENOUS
  Filled 2018-02-13: qty 900

## 2018-02-13 MED ORDER — SODIUM CHLORIDE 0.9 % IV SOLN
INTRAVENOUS | Status: DC
  Administered 2018-02-14: 08:00:00 via INTRAVENOUS

## 2018-02-13 MED ORDER — RENA-VITE PO TABS
1.0000 | ORAL_TABLET | Freq: Every day | ORAL | Status: DC
  Administered 2018-02-13 – 2018-02-16 (×4): 1 via ORAL
  Filled 2018-02-13 (×4): qty 1

## 2018-02-13 MED ORDER — HEPARIN BOLUS VIA INFUSION: 950.0000 [IU] | Freq: Once | INTRAVENOUS | Status: DC

## 2018-02-13 MED ORDER — ASPIRIN 81 MG PO CHEW
81.0000 mg | CHEWABLE_TABLET | ORAL | Status: AC
  Administered 2018-02-14: 81 mg via ORAL
  Filled 2018-02-13: qty 1

## 2018-02-13 MED ORDER — SODIUM CHLORIDE 0.9% FLUSH
3.0000 mL | Freq: Two times a day (BID) | INTRAVENOUS | Status: DC
  Administered 2018-02-13: 3 mL via INTRAVENOUS

## 2018-02-13 MED ORDER — SODIUM CHLORIDE 0.9 % IV SOLN: 250.0000 mL | INTRAVENOUS | Status: DC | PRN

## 2018-02-13 MED ORDER — INSULIN REGULAR HUMAN 100 UNIT/ML IJ SOLN
10.0000 [IU] | Freq: Once | INTRAMUSCULAR | Status: AC
  Administered 2018-02-13: 10 [IU] via INTRAVENOUS
  Filled 2018-02-13: qty 0.1

## 2018-02-13 NOTE — Progress Notes (Signed)
*  PRELIMINARY RESULTS* Echocardiogram 2D Echocardiogram has been performed.  Cristela BlueHege, Mira Balon 02/13/2018, 9:49 AM

## 2018-02-13 NOTE — Progress Notes (Signed)
Dr. Sheryle Hailiamond notified of elevated Troponins and Glucose post IV insulin; Acknowledged; New orders written.Windy Carinaurner,Olga Seyler K, RN 4:31 AM 02/13/2018

## 2018-02-13 NOTE — Progress Notes (Signed)
HD tx end.   02/13/18 1311  Vital Signs  Pulse Rate 93  Pulse Rate Source Monitor  Resp 15  BP 115/82  BP Location Right Arm  BP Method Automatic  Patient Position (if appropriate) Lying  Oxygen Therapy  SpO2 99 %  O2 Device Nasal Cannula  O2 Flow Rate (L/min) 3 L/min  During Hemodialysis Assessment  Dialysis Fluid Bolus Normal Saline  Bolus Amount (mL) 250 mL  Intra-Hemodialysis Comments Tx completed

## 2018-02-13 NOTE — H&P (View-Only) (Signed)
Progress Note  Patient Name: Nichole Hurst Date of Encounter: 02/13/2018  Primary Cardiologist: No primary care provider on file.   Subjective   She reports improvement in shortness of breath after dialysis but continues to have sharp atypical chest pain.  Echo showed a drop in ejection fraction to 30%.  Troponin peaked at 6.  Inpatient Medications    Scheduled Meds: . aspirin EC  81 mg Oral QHS  . atorvastatin  80 mg Oral QPM  . calcium acetate  1,334 mg Oral TID WC  . Difluprednate  1 drop Left Eye Daily  . docusate sodium  100 mg Oral BID  . [START ON 02/14/2018] feeding supplement (NEPRO CARB STEADY)  237 mL Oral BID BM  . ferrous sulfate  325 mg Oral Q breakfast  . insulin aspart  0-9 Units Subcutaneous TID AC & HS  . insulin glargine  8 Units Subcutaneous Daily  . latanoprost  1 drop Both Eyes QHS  . levalbuterol  1.25 mg Nebulization Q6H  . losartan  100 mg Oral QHS  . methylPREDNISolone (SOLU-MEDROL) injection  60 mg Intravenous Q6H  . metoprolol succinate  25 mg Oral BID  . moxifloxacin  1 drop Left Eye QID  . multivitamin  1 tablet Oral QHS  . pantoprazole  40 mg Oral Daily  . ticagrelor  60 mg Oral BID  . timolol  1 drop Left Eye BID  . vitamin C  250 mg Oral BID   Continuous Infusions: . heparin 1,050 Units/hr (02/13/18 1511)   PRN Meds: albuterol, ALPRAZolam, alum & mag hydroxide-simeth, chlorpheniramine-HYDROcodone, diphenhydrAMINE, fluticasone, nitroGLYCERIN, ondansetron **OR** ondansetron (ZOFRAN) IV, ondansetron, oxyCODONE, traMADol   Vital Signs    Vitals:   02/13/18 1300 02/13/18 1311 02/13/18 1320 02/13/18 1437  BP: 131/65 115/82 117/78 131/75  Pulse: 89 93 93 90  Resp: 13 15 13 18   Temp:   97.6 F (36.4 C) 97.7 F (36.5 C)  TempSrc:   Oral Oral  SpO2: 98% 99% 98% 93%  Weight:   154 lb 1.6 oz (69.9 kg)   Height:        Intake/Output Summary (Last 24 hours) at 02/13/2018 1652 Last data filed at 02/13/2018 1320 Gross per 24 hour  Intake 163 ml   Output 279 ml  Net -116 ml   Filed Weights   02/11/18 2111 02/13/18 0930 02/13/18 1320  Weight: 147 lb 14.9 oz (67.1 kg) 154 lb 8.7 oz (70.1 kg) 154 lb 1.6 oz (69.9 kg)    Telemetry    Sinus rhythm with sinus tachycardia and left bundle branch block.- Personally Reviewed  ECG    Not performed today.- Personally Reviewed  Physical Exam   GEN: No acute distress.   Neck: No JVD Cardiac: RRR, no murmurs, rubs, or gallops.  Respiratory: Clear to auscultation bilaterally. GI: Soft, nontender, non-distended  MS: No edema; No deformity. Neuro:  Nonfocal  Psych: Normal affect  Right femoral pulses normal.  Left arm fistula.  Labs    Chemistry Recent Labs  Lab 02/11/18 1659 02/11/18 2136 02/12/18 0338  NA 136  --  135  K 4.6  --  4.9  CL 92*  --  92*  CO2 32  --  25  GLUCOSE 245*  --  326*  BUN 33*  --  41*  CREATININE 6.58* 6.70* 7.09*  CALCIUM 9.7  --  9.3  GFRNONAA 6* 6* 5*  GFRAA 7* 7* 6*  ANIONGAP 12  --  18*  Hematology Recent Labs  Lab 02/11/18 2136 02/12/18 0338 02/13/18 0327  WBC 5.8 5.3 11.8*  RBC 4.43 4.47 4.18  HGB 12.7 12.4 11.9*  HCT 39.1 39.7 36.9  MCV 88.3 88.8 88.3  MCH 28.6 27.8 28.3  MCHC 32.4 31.3* 32.1  RDW 20.0* 20.4* 20.2*  PLT 179 171 186    Cardiac Enzymes Recent Labs  Lab 02/12/18 1334 02/12/18 2242 02/13/18 0327 02/13/18 1043  TROPONINI 5.80* 6.02* 6.93* 5.62*   No results for input(s): TROPIPOC in the last 168 hours.   BNPNo results for input(s): BNP, PROBNP in the last 168 hours.   DDimer No results for input(s): DDIMER in the last 168 hours.   Radiology    Ct Angio Chest Pe W And/or Wo Contrast  Result Date: 02/11/2018 CLINICAL DATA:  67 year old female presents with left-sided chest pain radiating to the back x2 days worse with deep inspiration. Reports cough. EXAM: CT ANGIOGRAPHY CHEST WITH CONTRAST TECHNIQUE: Multidetector CT imaging of the chest was performed using the standard protocol during bolus  administration of intravenous contrast. Multiplanar CT image reconstructions and MIPs were obtained to evaluate the vascular anatomy. CONTRAST:  75mL OMNIPAQUE IOHEXOL 350 MG/ML SOLN COMPARISON:  02/11/2018 CXR FINDINGS: Cardiovascular: Heart is top normal with aortic atherosclerosis. No aneurysm. Right-sided pacemaker apparatus with right atrial and right ventricular leads are noted. The patient is status post CABG. Moderate atherosclerosis of the great vessels with normal branch pattern off the thoracic aorta. No aortic aneurysm. No apparent dissection though study is tailored for the assessment of the pulmonary arterial system. No acute pulmonary embolus to the segmental branches. Dilatation of the main pulmonary artery to 3.5 cm consistent with changes of chronic pulmonary hypertension. Mediastinum/Nodes: Mild left lower paratracheal lymphadenopathy up to 19 mm short axis with smaller right lower paratracheal lymph nodes measuring up to 8 mm short axis. Patent trachea and mainstem bronchi. Esophagus is unremarkable. No thyromegaly or mass. Lungs/Pleura: No acute pulmonary consolidation, effusion or pneumothorax. No dominant mass. Upper Abdomen: No acute abnormality Musculoskeletal: Schmorl's nodes involving several upper thoracic vertebrae. No acute nor suspicious osseous abnormality. Review of the MIP images confirms the above findings. IMPRESSION: 1. Moderate thoracic aortic and branch vessel atherosclerosis. 2. Status post CABG with native coronary arteriosclerosis. 3. No active pulmonary disease.  No acute pulmonary embolus. 4. Mild mediastinal adenopathy, nonspecific possibly reactive in etiology. Aortic Atherosclerosis (ICD10-I70.0). Electronically Signed   By: Tollie Eth M.D.   On: 02/11/2018 19:06   Dg Chest Port 1 View  Result Date: 02/11/2018 CLINICAL DATA:  Left-sided chest pain.  Shortness of breath. EXAM: PORTABLE CHEST 1 VIEW COMPARISON:  August 08, 2017 FINDINGS: Stable pacemaker. The heart  size borderline. The hila and mediastinum are normal. No pulmonary nodules or masses. No focal infiltrates. IMPRESSION: No active disease. Electronically Signed   By: Gerome Sam III M.D   On: 02/11/2018 17:34    Cardiac Studies   "Echo was personally reviewed by me:  - Left ventricle: The cavity size was normal. There was moderate   concentric hypertrophy. Systolic function was moderately to   severely reduced. The estimated ejection fraction was in the   range of 30% to 35%. The study is not technically sufficient to   allow evaluation of LV diastolic function. - Mitral valve: Severely calcified annulus. Mildly thickened,   moderately calcified leaflets . There was moderate regurgitation. - Left atrium: The atrium was mildly to moderately dilated. - Tricuspid valve: There was moderate regurgitation. - Pulmonary arteries:  Systolic pressure was moderately increased.   PA peak pressure: 50 mm Hg (S).  Impressions:  - Since last echo, EF dropped .  Patient Profile     67 y.o. female with known history of coronary artery disease status post two-vessel CABG in July 2017 (LIMA to LAD and SVG to OM1) in 2017, tachybradycardia syndrome status post pacemaker placement in 2017, end-stage renal disease on hemodialysis, chronic diastolic heart failure with pulmonary hypertension, peripheral arterial disease, anemia of chronic disease, diabetes mellitus, hypertension and hyperlipidemia who presented with atypical chest pain and shortness of breath and was found to have a non-ST elevation myocardial infarction.  Assessment & Plan    1.  Non-ST elevation myocardial infarction: Her chest pain is very atypical and seems to be musculoskeletal.  Nonetheless, she has significant shortness of breath which I suspect is likely angina equivalent.  Troponin peaked around 6 and is coming down.  EKG showed left bundle branch block and echo showed a drop in ejection fraction to 30-35%. She is currently on  dual antiplatelet therapy with aspirin and low-dose Brilinta 60 mg twice daily. I recommend proceeding with a right and left heart catheterization and possible PCI.  I discussed the procedure in details as well as risks and benefits.  The patient will be scheduled for tomorrow with Dr. Okey DupreEnd.  2.  Acute systolic heart failure: Ejection fraction was normal last year but dropped to 30-35% during this admission.  Could be due to ischemic cardiomyopathy.  Continue treatment with Toprol and losartan for now.  Fluid management is done via dialysis.  3.  Hyperlipidemia: Continue atorvastatin.  4.  COPD: I do not appreciate wheezing by exam and she is currently on IV steroids. 5.  End-stage renal disease on hemodialysis: She had dialysis today.   I discussed the case with nursing and Dr. Victory Dakiniley from [facility].   For questions or updates, please contact [facility] Please consult [email or web address] for contact info under Cardiology/STEMI.      Signed, Lorine BearsMuhammad Honestee Revard, MD  02/13/2018, 4:52 PM

## 2018-02-13 NOTE — Progress Notes (Signed)
Post HD assessment, Pt tolerated treatment well. Pt experienced several symptomatic drops in BP throughout her tx and was give NS bolus as needed, MD aware. Pt stabilized toward the end of tx. Net UF 229.    02/13/18 1320  Vital Signs  Temp 97.6 F (36.4 C)  Temp Source Oral  Pulse Rate 93  Pulse Rate Source Monitor  Resp 13  BP 117/78  BP Location Right Arm  BP Method Automatic  Patient Position (if appropriate) Lying  Oxygen Therapy  SpO2 98 %  O2 Device Nasal Cannula  O2 Flow Rate (L/min) 3 L/min  Dialysis Weight  Weight 69.9 kg (154 lb 1.6 oz)  Type of Weight Post-Dialysis  Post-Hemodialysis Assessment  Rinseback Volume (mL) 250 mL  KECN 43.8 V  Dialyzer Clearance Lightly streaked  Duration of HD Treatment -hour(s) 3 hour(s)  Hemodialysis Intake (mL) 800 mL  UF Total -Machine (mL) 1029 mL  Net UF (mL) 229 mL  Tolerated HD Treatment Yes  Education / Care Plan  Dialysis Education Provided Yes  Documented Education in Care Plan Yes

## 2018-02-13 NOTE — Progress Notes (Signed)
Progress Note  Patient Name: Nichole Hurst Date of Encounter: 02/13/2018  Primary Cardiologist: No primary care provider on file.   Subjective   She reports improvement in shortness of breath after dialysis but continues to have sharp atypical chest pain.  Echo showed a drop in ejection fraction to 30%.  Troponin peaked at 6.  Inpatient Medications    Scheduled Meds: . aspirin EC  81 mg Oral QHS  . atorvastatin  80 mg Oral QPM  . calcium acetate  1,334 mg Oral TID WC  . Difluprednate  1 drop Left Eye Daily  . docusate sodium  100 mg Oral BID  . [START ON 02/14/2018] feeding supplement (NEPRO CARB STEADY)  237 mL Oral BID BM  . ferrous sulfate  325 mg Oral Q breakfast  . insulin aspart  0-9 Units Subcutaneous TID AC & HS  . insulin glargine  8 Units Subcutaneous Daily  . latanoprost  1 drop Both Eyes QHS  . levalbuterol  1.25 mg Nebulization Q6H  . losartan  100 mg Oral QHS  . methylPREDNISolone (SOLU-MEDROL) injection  60 mg Intravenous Q6H  . metoprolol succinate  25 mg Oral BID  . moxifloxacin  1 drop Left Eye QID  . multivitamin  1 tablet Oral QHS  . pantoprazole  40 mg Oral Daily  . ticagrelor  60 mg Oral BID  . timolol  1 drop Left Eye BID  . vitamin C  250 mg Oral BID   Continuous Infusions: . heparin 1,050 Units/hr (02/13/18 1511)   PRN Meds: albuterol, ALPRAZolam, alum & mag hydroxide-simeth, chlorpheniramine-HYDROcodone, diphenhydrAMINE, fluticasone, nitroGLYCERIN, ondansetron **OR** ondansetron (ZOFRAN) IV, ondansetron, oxyCODONE, traMADol   Vital Signs    Vitals:   02/13/18 1300 02/13/18 1311 02/13/18 1320 02/13/18 1437  BP: 131/65 115/82 117/78 131/75  Pulse: 89 93 93 90  Resp: 13 15 13 18   Temp:   97.6 F (36.4 C) 97.7 F (36.5 C)  TempSrc:   Oral Oral  SpO2: 98% 99% 98% 93%  Weight:   154 lb 1.6 oz (69.9 kg)   Height:        Intake/Output Summary (Last 24 hours) at 02/13/2018 1652 Last data filed at 02/13/2018 1320 Gross per 24 hour  Intake 163 ml   Output 279 ml  Net -116 ml   Filed Weights   02/11/18 2111 02/13/18 0930 02/13/18 1320  Weight: 147 lb 14.9 oz (67.1 kg) 154 lb 8.7 oz (70.1 kg) 154 lb 1.6 oz (69.9 kg)    Telemetry    Sinus rhythm with sinus tachycardia and left bundle branch block.- Personally Reviewed  ECG    Not performed today.- Personally Reviewed  Physical Exam   GEN: No acute distress.   Neck: No JVD Cardiac: RRR, no murmurs, rubs, or gallops.  Respiratory: Clear to auscultation bilaterally. GI: Soft, nontender, non-distended  MS: No edema; No deformity. Neuro:  Nonfocal  Psych: Normal affect  Right femoral pulses normal.  Left arm fistula.  Labs    Chemistry Recent Labs  Lab 02/11/18 1659 02/11/18 2136 02/12/18 0338  NA 136  --  135  K 4.6  --  4.9  CL 92*  --  92*  CO2 32  --  25  GLUCOSE 245*  --  326*  BUN 33*  --  41*  CREATININE 6.58* 6.70* 7.09*  CALCIUM 9.7  --  9.3  GFRNONAA 6* 6* 5*  GFRAA 7* 7* 6*  ANIONGAP 12  --  18*  Hematology Recent Labs  Lab 02/11/18 2136 02/12/18 0338 02/13/18 0327  WBC 5.8 5.3 11.8*  RBC 4.43 4.47 4.18  HGB 12.7 12.4 11.9*  HCT 39.1 39.7 36.9  MCV 88.3 88.8 88.3  MCH 28.6 27.8 28.3  MCHC 32.4 31.3* 32.1  RDW 20.0* 20.4* 20.2*  PLT 179 171 186    Cardiac Enzymes Recent Labs  Lab 02/12/18 1334 02/12/18 2242 02/13/18 0327 02/13/18 1043  TROPONINI 5.80* 6.02* 6.93* 5.62*   No results for input(s): TROPIPOC in the last 168 hours.   BNPNo results for input(s): BNP, PROBNP in the last 168 hours.   DDimer No results for input(s): DDIMER in the last 168 hours.   Radiology    Ct Angio Chest Pe W And/or Wo Contrast  Result Date: 02/11/2018 CLINICAL DATA:  67 year old female presents with left-sided chest pain radiating to the back x2 days worse with deep inspiration. Reports cough. EXAM: CT ANGIOGRAPHY CHEST WITH CONTRAST TECHNIQUE: Multidetector CT imaging of the chest was performed using the standard protocol during bolus  administration of intravenous contrast. Multiplanar CT image reconstructions and MIPs were obtained to evaluate the vascular anatomy. CONTRAST:  75mL OMNIPAQUE IOHEXOL 350 MG/ML SOLN COMPARISON:  02/11/2018 CXR FINDINGS: Cardiovascular: Heart is top normal with aortic atherosclerosis. No aneurysm. Right-sided pacemaker apparatus with right atrial and right ventricular leads are noted. The patient is status post CABG. Moderate atherosclerosis of the great vessels with normal branch pattern off the thoracic aorta. No aortic aneurysm. No apparent dissection though study is tailored for the assessment of the pulmonary arterial system. No acute pulmonary embolus to the segmental branches. Dilatation of the main pulmonary artery to 3.5 cm consistent with changes of chronic pulmonary hypertension. Mediastinum/Nodes: Mild left lower paratracheal lymphadenopathy up to 19 mm short axis with smaller right lower paratracheal lymph nodes measuring up to 8 mm short axis. Patent trachea and mainstem bronchi. Esophagus is unremarkable. No thyromegaly or mass. Lungs/Pleura: No acute pulmonary consolidation, effusion or pneumothorax. No dominant mass. Upper Abdomen: No acute abnormality Musculoskeletal: Schmorl's nodes involving several upper thoracic vertebrae. No acute nor suspicious osseous abnormality. Review of the MIP images confirms the above findings. IMPRESSION: 1. Moderate thoracic aortic and branch vessel atherosclerosis. 2. Status post CABG with native coronary arteriosclerosis. 3. No active pulmonary disease.  No acute pulmonary embolus. 4. Mild mediastinal adenopathy, nonspecific possibly reactive in etiology. Aortic Atherosclerosis (ICD10-I70.0). Electronically Signed   By: Tollie Eth M.D.   On: 02/11/2018 19:06   Dg Chest Port 1 View  Result Date: 02/11/2018 CLINICAL DATA:  Left-sided chest pain.  Shortness of breath. EXAM: PORTABLE CHEST 1 VIEW COMPARISON:  August 08, 2017 FINDINGS: Stable pacemaker. The heart  size borderline. The hila and mediastinum are normal. No pulmonary nodules or masses. No focal infiltrates. IMPRESSION: No active disease. Electronically Signed   By: Gerome Sam III M.D   On: 02/11/2018 17:34    Cardiac Studies   "Echo was personally reviewed by me:  - Left ventricle: The cavity size was normal. There was moderate   concentric hypertrophy. Systolic function was moderately to   severely reduced. The estimated ejection fraction was in the   range of 30% to 35%. The study is not technically sufficient to   allow evaluation of LV diastolic function. - Mitral valve: Severely calcified annulus. Mildly thickened,   moderately calcified leaflets . There was moderate regurgitation. - Left atrium: The atrium was mildly to moderately dilated. - Tricuspid valve: There was moderate regurgitation. - Pulmonary arteries:  Systolic pressure was moderately increased.   PA peak pressure: 50 mm Hg (S).  Impressions:  - Since last echo, EF dropped .  Patient Profile     67 y.o. female with known history of coronary artery disease status post two-vessel CABG in July 2017 (LIMA to LAD and SVG to OM1) in 2017, tachybradycardia syndrome status post pacemaker placement in 2017, end-stage renal disease on hemodialysis, chronic diastolic heart failure with pulmonary hypertension, peripheral arterial disease, anemia of chronic disease, diabetes mellitus, hypertension and hyperlipidemia who presented with atypical chest pain and shortness of breath and was found to have a non-ST elevation myocardial infarction.  Assessment & Plan    1.  Non-ST elevation myocardial infarction: Her chest pain is very atypical and seems to be musculoskeletal.  Nonetheless, she has significant shortness of breath which I suspect is likely angina equivalent.  Troponin peaked around 6 and is coming down.  EKG showed left bundle branch block and echo showed a drop in ejection fraction to 30-35%. She is currently on  dual antiplatelet therapy with aspirin and low-dose Brilinta 60 mg twice daily. I recommend proceeding with a right and left heart catheterization and possible PCI.  I discussed the procedure in details as well as risks and benefits.  The patient will be scheduled for tomorrow with Dr. Okey DupreEnd.  2.  Acute systolic heart failure: Ejection fraction was normal last year but dropped to 30-35% during this admission.  Could be due to ischemic cardiomyopathy.  Continue treatment with Toprol and losartan for now.  Fluid management is done via dialysis.  3.  Hyperlipidemia: Continue atorvastatin.  4.  COPD: I do not appreciate wheezing by exam and she is currently on IV steroids. 5.  End-stage renal disease on hemodialysis: She had dialysis today.   I discussed the case with nursing and Dr. Victory Dakiniley from [facility].   For questions or updates, please contact [facility] Please consult [email or web address] for contact info under Cardiology/STEMI.      Signed, Lorine BearsMuhammad Jalia Zuniga, MD  02/13/2018, 4:52 PM

## 2018-02-13 NOTE — Progress Notes (Signed)
Post HD assessment   02/13/18 1326  Neurological  Level of Consciousness Responds to Voice  Orientation Level Oriented X4  Respiratory  Respiratory Pattern Regular;Dyspnea with exertion  Chest Assessment Chest expansion symmetrical  Cardiac  Pulse Irregular  ECG Monitor Yes  Antiarrhythmic device  Antiarrhythmic device Permanent Pacemaker  Vascular  R Radial Pulse +2  L Radial Pulse +2  Integumentary  Integumentary (WDL) X  Skin Color Jaundice  Musculoskeletal  Musculoskeletal (WDL) X  Generalized Weakness Yes  Assistive Device None  GU Assessment  Genitourinary (WDL) X  Genitourinary Symptoms  (HD)  Psychosocial  Psychosocial (WDL) WDL

## 2018-02-13 NOTE — Progress Notes (Signed)
Pre HD assessment    02/13/18 0930  Vital Signs  Temp 98.1 F (36.7 C)  Temp Source Oral  Pulse Rate 98  Pulse Rate Source Monitor  Resp 18  BP (!) 149/78  BP Location Right Arm  BP Method Automatic  Patient Position (if appropriate) Lying  Oxygen Therapy  SpO2 99 %  O2 Device Nasal Cannula  O2 Flow Rate (L/min) 3 L/min (increased r/t anxiety, MD aware, )  Pain Assessment  Pain Scale 0-10  Pain Score 0  Dialysis Weight  Weight 70.1 kg (154 lb 8.7 oz)  Type of Weight Pre-Dialysis  Time-Out for Hemodialysis  What Procedure? HD  Pt Identifiers(min of two) First/Last Name;MRN/Account#  Correct Site? Yes  Correct Side? Yes  Correct Procedure? Yes  Consents Verified? Yes  Rad Studies Available? N/A  Safety Precautions Reviewed? Yes  Biochemist, clinicalMachine Checks  Machine Number  ([id number])  Station Number 3  UF/Alarm Test Passed  Conductivity: Meter 13.8  Conductivity: Machine  14.1  pH 7.4  Reverse Osmosis main  Normal Saline Lot Number [id number]  Dialyzer Lot Number [id number]  Disposable Set Lot Number [id number]  Machine Temperature 96.8 F (36 C) (MD aware)  ImmunologistAir Detector Armed and Audible Yes  Blood Lines Intact and Secured Yes  Pre Treatment Patient Checks  Vascular access used during treatment Fistula  Hepatitis B Surface Antigen Results Negative  Date Hepatitis B Surface Antigen Drawn 06/08/17  Hepatitis B Surface Antibody 287  Date Hepatitis B Surface Antibody Drawn 06/08/17  Hemodialysis Consent Verified Yes  Hemodialysis Standing Orders Initiated Yes  ECG (Telemetry) Monitor On Yes  Prime Ordered Normal Saline  Length of  DialysisTreatment -hour(s) 3 Hour(s)  Dialyzer Elisio 17H NR  Dialysate 3K, 2.5 Ca  Dialysis Anticoagulant None  Dialysate Flow Ordered 600  Blood Flow Rate Ordered 400 mL/min  Ultrafiltration Goal 2 Liters  Pre Treatment Labs Renal panel;CBC  Dialysis Blood Pressure Support Ordered Normal Saline  Education / Care Plan  Dialysis Education Provided Yes   Documented Education in Care Plan Yes

## 2018-02-13 NOTE — Progress Notes (Signed)
Pre HD notes   02/13/18 0945  During Hemodialysis Assessment  Intra-Hemodialysis Comments  (Pt was a difficult stick, 3 attempts for venous access)

## 2018-02-13 NOTE — Consult Note (Addendum)
ANTICOAGULATION CONSULT NOTE  Pharmacy Consult for Heparin Drip  Indication: chest pain/ACS  Allergies  Allergen Reactions  . Colchicine Anaphylaxis  . Clopidogrel    Patient Measurements: Height: 5\' 1"  (154.9 cm) Weight: 154 lb 1.6 oz (69.9 kg) IBW/kg (Calculated) : 47.8  Heparin dosing weight: 62 kg  Vital Signs: Temp: 97.6 F (36.4 C) (04/08 1320) Temp Source: Oral (04/08 1320) BP: 117/78 (04/08 1320) Pulse Rate: 93 (04/08 1320)  Labs: Recent Labs    02/11/18 1659  02/11/18 2136 02/12/18 0338  02/12/18 1334 02/12/18 2037 02/12/18 2242 02/13/18 0327 02/13/18 1043 02/13/18 1314  HGB 12.8  --  12.7 12.4  --   --   --   --  11.9*  --   --   HCT 40.2  --  39.1 39.7  --   --   --   --  36.9  --   --   PLT 198  --  179 171  --   --   --   --  186  --   --   APTT  --   --   --   --   --  30  --   --   --   --   --   LABPROT  --   --   --   --   --  13.9  --   --   --   --   --   INR  --   --   --   --   --  1.08  --   --   --   --   --   HEPARINUNFRC  --   --   --   --    < > <0.10* 0.30  --  0.24*  --  0.23*  CREATININE 6.58*  --  6.70* 7.09*  --   --   --   --   --   --   --   TROPONINI 0.04*   < > 0.10* 0.69*   < > 5.80*  --  6.02* 6.93* 5.62*  --    < > = values in this interval not displayed.    Estimated Creatinine Clearance: 7 mL/min (A) (by C-G formula based on SCr of 7.09 mg/dL (H)).   Medical History: Past Medical History:  Diagnosis Date  . Anemia of chronic disease   . CAD (coronary artery disease)    a. s/p 2-V CABG 05/2016 (LIMA-LAD, VG-OM1)  . Carotid stenosis   . Charcot's gait    FOOT  . Claustrophobia   . Colon polyp   . Diabetes mellitus with complication (HCC)   . Emphysema/COPD (HCC)   . ESRD (end stage renal disease) on dialysis (HCC)    DIAYLISIS M/W/F  . Fractures    Lt foot  . GERD (gastroesophageal reflux disease)   . Glaucoma   . History of kidney stones   . Hyperlipidemia   . Hypertension   . Migraine   . Orthopnea   .  Oxygen deficiency    AS NEEDED  . PAD (peripheral artery disease) (HCC)   . Pain    JOINTS  . Presence of permanent cardiac pacemaker    St. Jude   . Pulmonary hypertension (HCC)    a. TTE 10/18: EF 55-60%, no RWMA, Gr2DD, mild to moderate MR, mildly dilated left atrium, mildly dilated RV with normal RVSF, mildly dilated RA, severe TR, PASP 60-65 mmHg  . Retinopathy  DIABETIC  . Stroke [facility]) 09/2015   Ischemic stroke with R sided hemiparesis   Assessment: Pharmacy consulted for heparin drip dosing and monitoring in 67yo female for ACS/NSTEMI.  Troponin 3.09.  Goal of Therapy:  Heparin level 0.3-0.7 units/ml Monitor platelets by anticoagulation protocol: Yes   Plan:  4/8 heparin level @1314  subtherapeutic=0.23 Will give heparin bolus 950 units and increase heparin drip to 1050 units/hr Will check next heparin level in 8 hours  Continue to follow H&H and platelets   Cleopatra Cedar, PharmD Pharmacy Resident 02/13/2018 2:11 PM

## 2018-02-13 NOTE — Consult Note (Signed)
ANTICOAGULATION CONSULT NOTE - Initial Consult  Pharmacy Consult for Heparin Drip  Indication: chest pain/ACS  Allergies  Allergen Reactions  . Colchicine Anaphylaxis  . Clopidogrel    Patient Measurements: Height: 5\' 1"  (154.9 cm) Weight: 147 lb 14.9 oz (67.1 kg) IBW/kg (Calculated) : 47.8  Heparin dosing weight: 62 kg  Vital Signs: Temp: 97.6 F (36.4 C) (04/07 2044) Temp Source: Oral (04/07 2044) BP: 92/65 (04/07 2044) Pulse Rate: 93 (04/07 2044)  Labs: Recent Labs    02/11/18 1659  02/11/18 2136 02/12/18 0338  02/12/18 1334 02/12/18 2037 02/12/18 2242 02/13/18 0327  HGB 12.8  --  12.7 12.4  --   --   --   --  11.9*  HCT 40.2  --  39.1 39.7  --   --   --   --  36.9  PLT 198  --  179 171  --   --   --   --  186  APTT  --   --   --   --   --  30  --   --   --   LABPROT  --   --   --   --   --  13.9  --   --   --   INR  --   --   --   --   --  1.08  --   --   --   HEPARINUNFRC  --   --   --   --   --  <0.10* 0.30  --  0.24*  CREATININE 6.58*  --  6.70* 7.09*  --   --   --   --   --   TROPONINI 0.04*   < > 0.10* 0.69*   < > 5.80*  --  6.02* 6.93*   < > = values in this interval not displayed.    Estimated Creatinine Clearance: 6.8 mL/min (A) (by C-G formula based on SCr of 7.09 mg/dL (H)).   Medical History: Past Medical History:  Diagnosis Date  . Anemia of chronic disease   . CAD (coronary artery disease)    a. s/p 2-V CABG 05/2016 (LIMA-LAD, VG-OM1)  . Carotid stenosis   . Charcot's gait    FOOT  . Claustrophobia   . Colon polyp   . Diabetes mellitus with complication (HCC)   . Emphysema/COPD (HCC)   . ESRD (end stage renal disease) on dialysis (HCC)    DIAYLISIS M/W/F  . Fractures    Lt foot  . GERD (gastroesophageal reflux disease)   . Glaucoma   . History of kidney stones   . Hyperlipidemia   . Hypertension   . Migraine   . Orthopnea   . Oxygen deficiency    AS NEEDED  . PAD (peripheral artery disease) (HCC)   . Pain    JOINTS  .  Presence of permanent cardiac pacemaker    St. Jude   . Pulmonary hypertension (HCC)    a. TTE 10/18: EF 55-60%, no RWMA, Gr2DD, mild to moderate MR, mildly dilated left atrium, mildly dilated RV with normal RVSF, mildly dilated RA, severe TR, PASP 60-65 mmHg  . Retinopathy    DIABETIC  . Stroke [facility]) 09/2015   Ischemic stroke with R sided hemiparesis   Assessment: Pharmacy consulted for heparin drip dosing and monitoring in 67yo female for ACS/NSTEMI.  Troponin 3.09.  Goal of Therapy:  Heparin level 0.3-0.7 units/ml Monitor platelets by anticoagulation protocol: Yes   Plan:  Will increase heparin infusion to 850 units/hr and recheck a HL in 6 hours.   4/8 0330 heparin level 0.24. 900 unit bolus and increase rate to 950 units/hr. Recheck in 8 hours.  Erich MontaneMcBane,Maiko Salais S, PharmD, BCPS Clinical Pharmacist 02/13/2018 4:27 AM

## 2018-02-13 NOTE — Progress Notes (Signed)
Nutrition Education Note  RD consulted for Renal Education. Provided "Renal Nutrition Therapy" handout from the Academy of [employer]. Reviewed food groups and provided written recommended serving sizes specifically determined for patient's current nutritional status.   RD provided "Nutrition and Type II Diabetes" handout from the Academy of [employer]etics. Discussed different food groups and their effects on blood sugar, emphasizing carbohydrate-containing foods. Provided list of carbohydrates and recommended serving sizes of common foods.  Discussed importance of controlled and consistent carbohydrate intake throughout the day. Provided examples of ways to balance meals/snacks and encouraged intake of high-fiber, whole grain complex carbohydrates. Teach back method used.  Explained why diet restrictions are needed and provided lists of foods to limit/avoid that are high potassium, sodium, and phosphorus. Provided specific recommendations on safer alternatives of these foods. Strongly encouraged compliance of this diet.   Discussed importance of protein intake at each meal and snack. Provided examples of how to maximize protein intake throughout the day. Discussed the possible need for fluid restriction with dialysis, importance of minimizing weight gain between HD treatments, and renal-friendly beverage options.  Encouraged pt to take a daily renal multivitamin   Encouraged pt to discuss specific diet questions/concerns with RD at HD outpatient facility. Teach back method used.  Expect poor compliance.  Body mass index is 55.25 kg/m. Pt meets criteria for morbid obesity based on current BMI.  Current diet order is renal, patient is consuming approximately 100% of meals at this time. Labs and medications reviewed. No further nutrition interventions warranted at this time. RD contact information provided. If additional nutrition issues arise, please re-consult RD.  Betsey Holidayasey  Audy Dauphine MS, RD, LDN Pager #- [phone] After Hours Pager: [phone]

## 2018-02-13 NOTE — Progress Notes (Signed)
HD tx start   02/13/18 1002  Vital Signs  Pulse Rate 97  Pulse Rate Source Monitor  Resp 16  BP 121/62  BP Location Right Arm  BP Method Automatic  Patient Position (if appropriate) Lying  Oxygen Therapy  SpO2 100 %  O2 Device Nasal Cannula  O2 Flow Rate (L/min) 3 L/min  During Hemodialysis Assessment  Blood Flow Rate (mL/min) 250 mL/min (according to MD verbal orders, MD at bedside)  Arterial Pressure (mmHg) -70 mmHg  Venous Pressure (mmHg) 120 mmHg  Transmembrane Pressure (mmHg) 80 mmHg  Ultrafiltration Rate (mL/min) 830 mL/min  Dialysate Flow Rate (mL/min) 600 ml/min  Conductivity: Machine  14.4  HD Safety Checks Performed Yes  Dialysis Fluid Bolus Normal Saline  Bolus Amount (mL) 250 mL  Intra-Hemodialysis Comments Progressing as prescribed (MD at bedside)

## 2018-02-13 NOTE — Progress Notes (Addendum)
Inpatient Diabetes Program Recommendations  AACE/ADA: New Consensus Statement on Inpatient Glycemic Control (2015)  Target Ranges:  Prepandial:   less than 140 mg/dL      Peak postprandial:   less than 180 mg/dL (1-2 hours)      Critically ill patients:  140 - 180 mg/dL  Results for Nichole Hurst, Nichole Hurst (MRN [id number]) as of 02/13/2018 10:19  Ref. Range 02/12/2018 07:51 02/12/2018 11:45 02/12/2018 16:43 02/12/2018 21:32 02/13/2018 00:00 02/13/2018 00:10 02/13/2018 03:11 02/13/2018 07:43  Glucose-Capillary Latest Ref Range: 65 - 99 mg/dL 253370 (H) 664401 (H) 403286 (H) 402 (H) 370 (H) 346 (H) 321 (H) 375 (H)   Results for Nichole Hurst, Nichole Hurst (MRN [id number]) as of 02/13/2018 10:19  Ref. Range 11/29/2017 08:18 02/12/2018 03:38  Hemoglobin A1C Latest Ref Range: 4.8 - 5.6 % 7.2 (H) 8.1 (H)   Review of Glycemic Control  Diabetes history: DM2 Outpatient Diabetes medications: None Current orders for Inpatient glycemic control: Lantus 8 units daily, Novolog 0-9 units TID with meals; Solumedrol 60 mg Q6H  Inpatient Diabetes Program Recommendations:  Insulin - Basal: Noted Lantus 8 units ordered to start today.  Correction (SSI): Please consider adding Novolog 0-5 units QHS for bedtime correction. Insulin - Meal Coverage: If steroids are continued, please consider ordering Novolog 4 units TID with meals for meal coverage if patient eats at elast 50% of meals. HgbA1C: A1C 8.1% on 02/12/18.  At time of discharge, MD may want to consider discharging patient on oral DM medication and have patient follow up with PCP regarding glycemic control.     Addendum 02/13/18@15 :00-Talked with patient regarding DM control.  Patient stated "I know all I need to about DM." Discussed glucose trends as outpatient and patient states that they check her glucose at the dialysis center and that her PCP checks as well. Discussed A1C 8.1% on 02/12/18 and patient reports that her A1C is elevated because of the steroids she is receiving. Explained that the A1C is based on  glucose average over the past 2-3 months. Inquired if patient has been on any steroids outpatient over the past 2-3 months and patient states that she has not been on any steroids outpatient over the past 3 months. Discussed glucose and A1C goals and explained that she may need to be prescribed medication for DM control as an outpatient. Patient reports that she use to take a pill for DM in the past (over 2 years ago and unsure of the name of it) but she stopped taking it because it made her "feel bad". Asked patient to provide additional information on how it made her feel and she reported "it made me dizzy, sweaty, and weak." Asked if she every checked her glucose when she felt that way and she stated that she had not. Explained that she may have been experiencing hypoglycemia as the symptoms of hypoglycemia include sweating, shaky, dizziness, and confusion.  Inquired about taking outpatient DM medications and patient stated that she would NOT take any DM medications outpatient. Inquired about glucose monitoring at home and patient states that "it hurts my fingers too much to check it at home."  Discussed importance of checking CBGs and maintaining good CBG control to prevent long-term and short-term complications.  Discussed impact of nutrition, exercise, stress, sickness, and medications on diabetes control. Patient states that she does not follow any type of diet and she reports that she drinks juice and regular sodas.  Discussed carbohydrates and encouraged patient to cut back or eliminate sugary beverages from diet which  will help to improve glycemic control.  Patient states that she has been told by the dialysis center that she can drink 3 small cans of regular soda a day and she plans to continue drinking them. Again encouraged patient to try to make dietary changes to follow renal carb modified diet. Informed patient a RD consult would be ordered for further education on Renal Carb Modified diet and  patient stated that she would talk with them but likely would not make any changes.  Encouraged patient to talk with her PCP regarding A1C. Patient verbalized understanding of information discussed and she states that she has no further questions at this time related to diabetes.  Thanks, Orlando Penner, RN, MSN, CDE Diabetes Coordinator Inpatient Diabetes Program [phone] (Team Pager from 8am to 5pm)

## 2018-02-13 NOTE — Progress Notes (Signed)
Pre HD assessment,    02/13/18 0935  Neurological  Level of Consciousness Alert  Orientation Level Oriented X4  Respiratory  Respiratory Pattern Regular;Dyspnea with exertion  Chest Assessment Chest expansion symmetrical  Cardiac  ECG Monitor Yes  Antiarrhythmic device  Antiarrhythmic device Permanent Pacemaker  Vascular  R Radial Pulse +2  L Radial Pulse +2  Integumentary  Integumentary (WDL) X  Skin Color Jaundice  Musculoskeletal  Musculoskeletal (WDL) X  Generalized Weakness Yes  Assistive Device None  GU Assessment  Genitourinary (WDL) X  Genitourinary Symptoms  (HD)  Psychosocial  Psychosocial (WDL) X  Patient Behaviors Anxious;Restless  Emotional support given Given to patient

## 2018-02-13 NOTE — Progress Notes (Signed)
[employer] - [place] at [facility]                                                                                                                                                                                  Patient Demographics   Nichole Hurst, is a 67 y.o. female, DOB - Jun 18, 1951, ZOX:[id number]  Admit date - 02/11/2018   Admitting Physician Ramonita Lab, MD  Outpatient Primary MD for the patient is [facility]   LOS - 2  Subjective: Patient seen and evaluated today Decreased shortness of breath On and off chest tightness Has occasional cough and wheezing No fever and chills Due for dialysis today   Review of Systems:   CONSTITUTIONAL: No documented fever. No fatigue, weakness. No weight gain, no weight loss.  EYES: No blurry or double vision.  ENT: No tinnitus. No postnasal drip. No redness of the oropharynx.  RESPIRATORY: Has decreased cough, has wheezing, no hemoptysis. No dyspnea.  CARDIOVASCULAR: Has chest tightness. No orthopnea. No palpitations. No syncope.  GASTROINTESTINAL: No nausea, no vomiting or diarrhea. No abdominal pain. No melena or hematochezia.  GENITOURINARY: No dysuria or hematuria.  ENDOCRINE: No polyuria or nocturia. No heat or cold intolerance.  HEMATOLOGY: No anemia. No bruising. No bleeding.  INTEGUMENTARY: No rashes. No lesions.  MUSCULOSKELETAL: No arthritis. No swelling. No gout.  NEUROLOGIC: No numbness, tingling, or ataxia. No seizure-type activity.  PSYCHIATRIC: No anxiety. No insomnia. No ADD.    Vitals:   Vitals:   02/13/18 0525 02/13/18 0611 02/13/18 0930 02/13/18 1002  BP: 126/83  (!) 149/78 121/62  Pulse: 91  98 97  Resp: 16  18 16   Temp: (!) 97.5 F (36.4 C)  98.1 F (36.7 C)   TempSrc: Oral  Oral   SpO2: (!) 87% 94% 99% 100%  Weight:   70.1 kg (154 lb 8.7 oz)   Height:        Wt Readings from Last 3 Encounters:  02/13/18 70.1 kg (154 lb 8.7 oz)  01/31/18 66.7 kg (147 lb)  11/30/17 70.3  kg (154 lb 15.7 oz)     Intake/Output Summary (Last 24 hours) at 02/13/2018 1022 Last data filed at 02/13/2018 0600 Gross per 24 hour  Intake 403 ml  Output 50 ml  Net 353 ml    Physical Exam:   GENERAL: Pleasant-appearing in no apparent distress.  HEAD, EYES, EARS, NOSE AND THROAT: Atraumatic, normocephalic. Extraocular muscles are intact. Pupils equal and reactive to light. Sclerae anicteric. No conjunctival injection. No oro-pharyngeal erythema.  NECK: Supple. There is no jugular venous distention. No bruits, no lymphadenopathy, no thyromegaly.  HEART: Regular rate and rhythm,. No murmurs, no  rubs, no clicks.  LUNGS:Improved airflow in both lungs, bilateral wheezing improved ABDOMEN: Soft, flat, nontender, nondistended. Has good bowel sounds. No hepatosplenomegaly appreciated.  EXTREMITIES: No evidence of any cyanosis, clubbing, or peripheral edema.  +2 pedal and radial pulses bilaterally.  NEUROLOGIC: The patient is alert, awake, and oriented x3 with no focal motor or sensory deficits appreciated bilaterally.  SKIN: Moist and warm with no rashes appreciated.  Psych: Not anxious, depressed LN: No inguinal LN enlargement    Antibiotics   Anti-infectives (From admission, onward)   Start     Dose/Rate Route Frequency Ordered Stop   02/11/18 2130  azithromycin (ZITHROMAX) tablet 500 mg     500 mg Oral Daily 02/11/18 2127 02/14/18 0959      Medications   Scheduled Meds: . aspirin EC  81 mg Oral QHS  . atorvastatin  80 mg Oral QPM  . azithromycin  500 mg Oral Daily  . calcium acetate  1,334 mg Oral TID WC  . Difluprednate  1 drop Left Eye Daily  . docusate sodium  100 mg Oral BID  . ferrous sulfate  325 mg Oral Q breakfast  . insulin aspart  0-9 Units Subcutaneous TID AC & HS  . insulin glargine  8 Units Subcutaneous Daily  . latanoprost  1 drop Both Eyes QHS  . levalbuterol  1.25 mg Nebulization Q6H  . losartan  100 mg Oral QHS  . methylPREDNISolone (SOLU-MEDROL)  injection  60 mg Intravenous Q6H  . metoprolol succinate  25 mg Oral BID  . moxifloxacin  1 drop Left Eye QID  . pantoprazole  40 mg Oral Daily  . ticagrelor  60 mg Oral BID  . timolol  1 drop Left Eye BID   Continuous Infusions: . heparin 950 Units/hr (02/13/18 0842)   PRN Meds:.albuterol, ALPRAZolam, alum & mag hydroxide-simeth, chlorpheniramine-HYDROcodone, diphenhydrAMINE, fluticasone, nitroGLYCERIN, ondansetron **OR** ondansetron (ZOFRAN) IV, ondansetron, oxyCODONE, traMADol   Data Review:   Micro Results No results found for this or any previous visit (from the past 240 hour(s)).  Radiology Reports Ct Angio Chest Pe W And/or Wo Contrast  Result Date: 02/11/2018 CLINICAL DATA:  67 year old female presents with left-sided chest pain radiating to the back x2 days worse with deep inspiration. Reports cough. EXAM: CT ANGIOGRAPHY CHEST WITH CONTRAST TECHNIQUE: Multidetector CT imaging of the chest was performed using the standard protocol during bolus administration of intravenous contrast. Multiplanar CT image reconstructions and MIPs were obtained to evaluate the vascular anatomy. CONTRAST:  75mL OMNIPAQUE IOHEXOL 350 MG/ML SOLN COMPARISON:  02/11/2018 CXR FINDINGS: Cardiovascular: Heart is top normal with aortic atherosclerosis. No aneurysm. Right-sided pacemaker apparatus with right atrial and right ventricular leads are noted. The patient is status post CABG. Moderate atherosclerosis of the great vessels with normal branch pattern off the thoracic aorta. No aortic aneurysm. No apparent dissection though study is tailored for the assessment of the pulmonary arterial system. No acute pulmonary embolus to the segmental branches. Dilatation of the main pulmonary artery to 3.5 cm consistent with changes of chronic pulmonary hypertension. Mediastinum/Nodes: Mild left lower paratracheal lymphadenopathy up to 19 mm short axis with smaller right lower paratracheal lymph nodes measuring up to 8 mm  short axis. Patent trachea and mainstem bronchi. Esophagus is unremarkable. No thyromegaly or mass. Lungs/Pleura: No acute pulmonary consolidation, effusion or pneumothorax. No dominant mass. Upper Abdomen: No acute abnormality Musculoskeletal: Schmorl's nodes involving several upper thoracic vertebrae. No acute nor suspicious osseous abnormality. Review of the MIP images confirms the above findings. IMPRESSION:  1. Moderate thoracic aortic and branch vessel atherosclerosis. 2. Status post CABG with native coronary arteriosclerosis. 3. No active pulmonary disease.  No acute pulmonary embolus. 4. Mild mediastinal adenopathy, nonspecific possibly reactive in etiology. Aortic Atherosclerosis (ICD10-I70.0). Electronically Signed   By: Tollie Eth M.D.   On: 02/11/2018 19:06   Dg Chest Port 1 View  Result Date: 02/11/2018 CLINICAL DATA:  Left-sided chest pain.  Shortness of breath. EXAM: PORTABLE CHEST 1 VIEW COMPARISON:  August 08, 2017 FINDINGS: Stable pacemaker. The heart size borderline. The hila and mediastinum are normal. No pulmonary nodules or masses. No focal infiltrates. IMPRESSION: No active disease. Electronically Signed   By: Gerome Sam III M.D   On: 02/11/2018 17:34     CBC Recent Labs  Lab 02/11/18 1659 02/11/18 2136 02/12/18 0338 02/13/18 0327  WBC 6.2 5.8 5.3 11.8*  HGB 12.8 12.7 12.4 11.9*  HCT 40.2 39.1 39.7 36.9  PLT 198 179 171 186  MCV 88.2 88.3 88.8 88.3  MCH 27.9 28.6 27.8 28.3  MCHC 31.7* 32.4 31.3* 32.1  RDW 19.8* 20.0* 20.4* 20.2*    Chemistries  Recent Labs  Lab 02/11/18 1659 02/11/18 2136 02/12/18 0338  NA 136  --  135  K 4.6  --  4.9  CL 92*  --  92*  CO2 32  --  25  GLUCOSE 245*  --  326*  BUN 33*  --  41*  CREATININE 6.58* 6.70* 7.09*  CALCIUM 9.7  --  9.3   ------------------------------------------------------------------------------------------------------------------ estimated creatinine clearance is 7 mL/min (A) (by C-G formula based on SCr  of 7.09 mg/dL (H)). ------------------------------------------------------------------------------------------------------------------ Recent Labs    02/12/18 0338  HGBA1C 8.1*   ------------------------------------------------------------------------------------------------------------------ No results for input(s): CHOL, HDL, LDLCALC, TRIG, CHOLHDL, LDLDIRECT in the last 72 hours. ------------------------------------------------------------------------------------------------------------------ No results for input(s): TSH, T4TOTAL, T3FREE, THYROIDAB in the last 72 hours.  Invalid input(s): FREET3 ------------------------------------------------------------------------------------------------------------------ No results for input(s): VITAMINB12, FOLATE, FERRITIN, TIBC, IRON, RETICCTPCT in the last 72 hours.  Coagulation profile Recent Labs  Lab 02/12/18 1334  INR 1.08    No results for input(s): DDIMER in the last 72 hours.  Cardiac Enzymes Recent Labs  Lab 02/12/18 1334 02/12/18 2242 02/13/18 0327  TROPONINI 5.80* 6.02* 6.93*   ------------------------------------------------------------------------------------------------------------------ Invalid input(s): POCBNP    Assessment & Plan   67 year old female patient with history of end-stage renal disease on dialysis, COPD, hyperlipidemia, non-insulin-dependent diabetes mellitus, coronary artery disease, CABG currently under hospitalist service for COPD exacerbation and elevated troponin and chest tightness.  1.  Elevated troponin with left bundle branch block on EKG with chest tightness Status post cardiology evaluation Decision on cardiac cath by cardiology Follow-up echocardiogram report Continue aspirin, Brilinta, Lipitor, beta-blocker Cozaar and statin On heparin drip for anticoagulation  2.  Diastolic congestive heart failure with pulmonary hypertension Volume control by hemodialysis Dialysis today  3.   End-stage renal disease on dialysis Appreciate nephrology follow-up  4.  Acute COPD exacerbation Continue IV Solu-Medrol bronchodilator treatments Zithromax antibiotic Oxygen via nasal cannula       Code Status Orders  (From admission, onward)        Start     Ordered   02/11/18 2127  Full code  Continuous     02/11/18 2127    Code Status History    Date Active Date Inactive Code Status Order ID Comments User Context   11/28/2017 1726 11/30/2017 2143 Full Code [id number]  Altamese Dilling, MD Inpatient   08/08/2017 1036 08/09/2017 1855 Full Code  [id number]  Enedina FinnerPatel, Sona, MD Inpatient   06/13/2017 1100 06/13/2017 1602 Full Code [id number]  Annice Needyew, Jason S, MD Inpatient      Time Spent in minutes   35  Greater than 50% of time spent in care coordination and counseling patient regarding the condition and plan of care.   Ihor AustinPavan Avyanna Spada M.D on 02/13/2018 at 10:22 AM  Between 7am to 6pm - Pager - [phone]  After 6pm go to [email or web address] - Social research officer, [email or web address] [email or web address]  [employer]   Office  [phone]

## 2018-02-13 NOTE — Progress Notes (Signed)
Patient refusing another IV site in right shoulder; after Nursery RN came to place IV; Hep gtt paused to give IV insulin and IV solumedrol, restarted. Windy Carinaurner,Dionisio Aragones K, RN 02/13/2018

## 2018-02-13 NOTE — Consult Note (Deleted)
ANTICOAGULATION CONSULT NOTE  Pharmacy Consult for Heparin Drip  Indication: chest pain/ACS  Allergies  Allergen Reactions  . Colchicine Anaphylaxis  . Clopidogrel    Patient Measurements: Height: 5\' 1"  (154.9 cm) Weight: 154 lb 1.6 oz (69.9 kg) IBW/kg (Calculated) : 47.8  Heparin dosing weight: 62 kg  Vital Signs: Temp: 97.6 F (36.4 C) (04/08 1320) Temp Source: Oral (04/08 1320) BP: 117/78 (04/08 1320) Pulse Rate: 93 (04/08 1320)  Labs: Recent Labs    02/11/18 1659  02/11/18 2136 02/12/18 0338  02/12/18 1334 02/12/18 2037 02/12/18 2242 02/13/18 0327 02/13/18 1043 02/13/18 1314  HGB 12.8  --  12.7 12.4  --   --   --   --  11.9*  --   --   HCT 40.2  --  39.1 39.7  --   --   --   --  36.9  --   --   PLT 198  --  179 171  --   --   --   --  186  --   --   APTT  --   --   --   --   --  30  --   --   --   --   --   LABPROT  --   --   --   --   --  13.9  --   --   --   --   --   INR  --   --   --   --   --  1.08  --   --   --   --   --   HEPARINUNFRC  --   --   --   --    < > <0.10* 0.30  --  0.24*  --  0.23*  CREATININE 6.58*  --  6.70* 7.09*  --   --   --   --   --   --   --   TROPONINI 0.04*   < > 0.10* 0.69*   < > 5.80*  --  6.02* 6.93* 5.62*  --    < > = values in this interval not displayed.    Estimated Creatinine Clearance: 7 mL/min (A) (by C-G formula based on SCr of 7.09 mg/dL (H)).   Medical History: Past Medical History:  Diagnosis Date  . Anemia of chronic disease   . CAD (coronary artery disease)    a. s/p 2-V CABG 05/2016 (LIMA-LAD, VG-OM1)  . Carotid stenosis   . Charcot's gait    FOOT  . Claustrophobia   . Colon polyp   . Diabetes mellitus with complication (HCC)   . Emphysema/COPD (HCC)   . ESRD (end stage renal disease) on dialysis (HCC)    DIAYLISIS M/W/F  . Fractures    Lt foot  . GERD (gastroesophageal reflux disease)   . Glaucoma   . History of kidney stones   . Hyperlipidemia   . Hypertension   . Migraine   . Orthopnea   .  Oxygen deficiency    AS NEEDED  . PAD (peripheral artery disease) (HCC)   . Pain    JOINTS  . Presence of permanent cardiac pacemaker    St. Jude   . Pulmonary hypertension (HCC)    a. TTE 10/18: EF 55-60%, no RWMA, Gr2DD, mild to moderate MR, mildly dilated left atrium, mildly dilated RV with normal RVSF, mildly dilated RA, severe TR, PASP 60-65 mmHg  . Retinopathy  DIABETIC  . Stroke [facility]HCC) 09/2015   Ischemic stroke with R sided hemiparesis   Assessment: Pharmacy consulted for heparin drip dosing and monitoring in 66yo female for ACS/NSTEMI.  Troponin 3.09.  Goal of Therapy:  Heparin level 0.3-0.7 units/ml Monitor platelets by anticoagulation protocol: Yes   Plan:  4/8 heparin level @1314  subtherapeutic=0.23 Will give heparin bolus 950 units and increase heparin drip to 1050 units/hr Continue to follow H&H and platelets   Cleopatra CedarStephanie Elsye Mccollister, PharmD Pharmacy Resident 02/13/2018 2:06 PM

## 2018-02-13 NOTE — Care Management (Signed)
Patient admitted with COPD.  Patient noted to have elevated troponin and currently on an heparin drip.  RNCM confirmed [facility].  [facility]  Per Dr. Victory Dakiniley patient is supposed to come to [facility] 2 days week (on here non HD days), however she frequently doesn't come.  Chronic HD patient. Dimas ChyleAmanda Morris dialysis liaison notified of admission.  Patient wears O2 nocturnally only.  Patient requiring continuous while inpatient.  RNCM following

## 2018-02-13 NOTE — Progress Notes (Signed)
[facility]  ROUNDING NOTE   Subjective:   [name] admitted to [facility] on 02/11/2018 for Atypical chest pain [R07.89] Chest pain [R07.9] Chronic obstructive pulmonary disease, unspecified COPD type [facility](HCC) [J44.9]  Patient seen in the dialysis suite. She c/o feeling hot and anxiety Asking for a fan   Objective:  Vital signs in last 24 hours:  Temp:  [97.1 F (36.2 C)-97.6 F (36.4 C)] 97.5 F (36.4 C) (04/08 0525) Pulse Rate:  [91-101] 91 (04/08 0525) Resp:  [16-20] 16 (04/08 0525) BP: (92-146)/(65-88) 126/83 (04/08 0525) SpO2:  [87 %-95 %] 94 % (04/08 0611)  Weight change:  Filed Weights   02/11/18 1651 02/11/18 2111  Weight: 143 lb (64.9 kg) 147 lb 14.9 oz (67.1 kg)    Intake/Output: I/O last 3 completed shifts: In: 523 [P.O.:360; I.V.:163] Out: 50 [Urine:50]   Intake/Output this shift:  No intake/output data recorded.  Physical Exam: General: NAD, sitting   Head: Normocephalic, atraumatic. Moist oral mucosal membranes  Eyes: Anicteric,    Neck: Supple, trachea midline  Lungs:  biblasilar crackles  Heart: Regular rate and rhythm  Abdomen:  Soft, nontender,   Extremities: no peripheral edema. Left first toe amputation  Neurologic: Nonfocal, moving all four extremities  Skin: Healing left first toe ulcer  Access: Left upper arm AVF    Basic Metabolic Panel: Recent Labs  Lab 02/11/18 1659 02/11/18 2136 02/12/18 0338  NA 136  --  135  K 4.6  --  4.9  CL 92*  --  92*  CO2 32  --  25  GLUCOSE 245*  --  326*  BUN 33*  --  41*  CREATININE 6.58* 6.70* 7.09*  CALCIUM 9.7  --  9.3    Liver Function Tests: No results for input(s): AST, ALT, ALKPHOS, BILITOT, PROT, ALBUMIN in the last 168 hours. No results for input(s): LIPASE, AMYLASE in the last 168 hours. No results for input(s): AMMONIA in the last 168 hours.  CBC: Recent Labs  Lab 02/11/18 1659 02/11/18 2136 02/12/18 0338 02/13/18 0327  WBC 6.2 5.8 5.3 11.8*  HGB 12.8 12.7  12.4 11.9*  HCT 40.2 39.1 39.7 36.9  MCV 88.2 88.3 88.8 88.3  PLT 198 179 171 186    Cardiac Enzymes: Recent Labs  Lab 02/12/18 0338 02/12/18 0907 02/12/18 1334 02/12/18 2242 02/13/18 0327  TROPONINI 0.69* 3.09* 5.80* 6.02* 6.93*    BNP: Invalid input(s): POCBNP  CBG: Recent Labs  Lab 02/12/18 2132 02/13/18 0000 02/13/18 0010 02/13/18 0311 02/13/18 0743  GLUCAP 402* 370* 346* 321* 375*    Microbiology: Results for orders placed or performed during the hospital encounter of 11/28/17  Surgical PCR screen     Status: None   Collection Time: 11/28/17  5:37 PM  Result Value Ref Range Status   MRSA, PCR NEGATIVE NEGATIVE Final   Staphylococcus aureus NEGATIVE NEGATIVE Final    Comment: (NOTE) The Xpert SA Assay (FDA approved for NASAL specimens in patients 67 years of age and older), is one component of a comprehensive surveillance program. It is not intended to diagnose infection nor to guide or monitor treatment. Performed at [facility], [address] [place], [place] [address]   Aerobic/Anaerobic Culture (surgical/deep wound)     Status: None   Collection Time: 11/29/17  1:33 PM  Result Value Ref Range Status   Specimen Description   Final    BONE LEFT TOE GREAT TOE Performed at [facility], [address]  [place], [place] [address]    Special Requests   Final    NONE Performed at [facility], [address] [place], [place] [address]    Gram Stain   Final    FEW WBC PRESENT, PREDOMINANTLY PMN RARE GRAM POSITIVE COCCI    Culture   Final    FEW KLEBSIELLA PNEUMONIAE FEW STAPHYLOCOCCUS AUREUS NO ANAEROBES ISOLATED Performed at [facility], [address], [place], [place] [address]    Report Status 12/04/2017 FINAL  Final   Organism ID, Bacteria KLEBSIELLA PNEUMONIAE  Final   Organism ID, Bacteria STAPHYLOCOCCUS AUREUS  Final      Susceptibility   Klebsiella pneumoniae - MIC*    AMPICILLIN RESISTANT Resistant      CEFAZOLIN <=4 SENSITIVE Sensitive     CEFEPIME <=1 SENSITIVE Sensitive     CEFTAZIDIME <=1 SENSITIVE Sensitive     CEFTRIAXONE <=1 SENSITIVE Sensitive     CIPROFLOXACIN <=0.25 SENSITIVE Sensitive     GENTAMICIN <=1 SENSITIVE Sensitive     IMIPENEM <=0.25 SENSITIVE Sensitive     TRIMETH/SULFA <=20 SENSITIVE Sensitive     AMPICILLIN/SULBACTAM 4 SENSITIVE Sensitive     PIP/TAZO <=4 SENSITIVE Sensitive     Extended ESBL NEGATIVE Sensitive     * FEW KLEBSIELLA PNEUMONIAE   Staphylococcus aureus - MIC*    CIPROFLOXACIN <=0.5 SENSITIVE Sensitive     ERYTHROMYCIN <=0.25 SENSITIVE Sensitive     GENTAMICIN <=0.5 SENSITIVE Sensitive     OXACILLIN 0.5 SENSITIVE Sensitive     TETRACYCLINE <=1 SENSITIVE Sensitive     VANCOMYCIN 1 SENSITIVE Sensitive     TRIMETH/SULFA <=10 SENSITIVE Sensitive     CLINDAMYCIN <=0.25 SENSITIVE Sensitive     RIFAMPIN <=0.5 SENSITIVE Sensitive     Inducible Clindamycin NEGATIVE Sensitive     * FEW STAPHYLOCOCCUS AUREUS    Coagulation Studies: Recent Labs    02/12/18 1334  LABPROT 13.9  INR 1.08    Urinalysis: No results for input(s): COLORURINE, LABSPEC, PHURINE, GLUCOSEU, HGBUR, BILIRUBINUR, KETONESUR, PROTEINUR, UROBILINOGEN, NITRITE, LEUKOCYTESUR in the last 72 hours.  Invalid input(s): APPERANCEUR    Imaging: Ct Angio Chest Pe W And/or Wo Contrast  Result Date: 02/11/2018 CLINICAL DATA:  67 year old female presents with left-sided chest pain radiating to the back x2 days worse with deep inspiration. Reports cough. EXAM: CT ANGIOGRAPHY CHEST WITH CONTRAST TECHNIQUE: Multidetector CT imaging of the chest was performed using the standard protocol during bolus administration of intravenous contrast. Multiplanar CT image reconstructions and MIPs were obtained to evaluate the vascular anatomy. CONTRAST:  75mL OMNIPAQUE IOHEXOL 350 MG/ML SOLN COMPARISON:  02/11/2018 CXR FINDINGS: Cardiovascular: Heart is top normal with aortic atherosclerosis. No aneurysm.  Right-sided pacemaker apparatus with right atrial and right ventricular leads are noted. The patient is status post CABG. Moderate atherosclerosis of the great vessels with normal branch pattern off the thoracic aorta. No aortic aneurysm. No apparent dissection though study is tailored for the assessment of the pulmonary arterial system. No acute pulmonary embolus to the segmental branches. Dilatation of the main pulmonary artery to 3.5 cm consistent with changes of chronic pulmonary hypertension. Mediastinum/Nodes: Mild left lower paratracheal lymphadenopathy up to 19 mm short axis with smaller right lower paratracheal lymph nodes measuring up to 8 mm short axis. Patent trachea and mainstem bronchi. Esophagus is unremarkable. No thyromegaly or mass. Lungs/Pleura: No acute pulmonary consolidation, effusion or pneumothorax. No dominant mass. Upper Abdomen: No acute abnormality Musculoskeletal: Schmorl's nodes involving several upper thoracic vertebrae. No acute nor suspicious  osseous abnormality. Review of the MIP images confirms the above findings. IMPRESSION: 1. Moderate thoracic aortic and branch vessel atherosclerosis. 2. Status post CABG with native coronary arteriosclerosis. 3. No active pulmonary disease.  No acute pulmonary embolus. 4. Mild mediastinal adenopathy, nonspecific possibly reactive in etiology. Aortic Atherosclerosis (ICD10-I70.0). Electronically Signed   By: Tollie Eth M.D.   On: 02/11/2018 19:06   Dg Chest Port 1 View  Result Date: 02/11/2018 CLINICAL DATA:  Left-sided chest pain.  Shortness of breath. EXAM: PORTABLE CHEST 1 VIEW COMPARISON:  August 08, 2017 FINDINGS: Stable pacemaker. The heart size borderline. The hila and mediastinum are normal. No pulmonary nodules or masses. No focal infiltrates. IMPRESSION: No active disease. Electronically Signed   By: Gerome Sam III M.D   On: 02/11/2018 17:34     Medications:   . heparin 950 Units/hr (02/13/18 0842)   . aspirin EC  81 mg  Oral QHS  . atorvastatin  80 mg Oral QPM  . azithromycin  500 mg Oral Daily  . calcium acetate  1,334 mg Oral TID WC  . Difluprednate  1 drop Left Eye Daily  . docusate sodium  100 mg Oral BID  . ferrous sulfate  325 mg Oral Q breakfast  . insulin aspart  0-9 Units Subcutaneous TID AC & HS  . insulin glargine  8 Units Subcutaneous Daily  . latanoprost  1 drop Both Eyes QHS  . levalbuterol  1.25 mg Nebulization Q6H  . losartan  100 mg Oral QHS  . methylPREDNISolone (SOLU-MEDROL) injection  60 mg Intravenous Q6H  . metoprolol succinate  25 mg Oral BID  . moxifloxacin  1 drop Left Eye QID  . pantoprazole  40 mg Oral Daily  . ticagrelor  60 mg Oral BID  . timolol  1 drop Left Eye BID   albuterol, ALPRAZolam, alum & mag hydroxide-simeth, chlorpheniramine-HYDROcodone, diphenhydrAMINE, fluticasone, nitroGLYCERIN, ondansetron **OR** ondansetron (ZOFRAN) IV, ondansetron, oxyCODONE, traMADol  Assessment/ Plan:  Nichole Hurst is a 67 y.o. white female with end stage renal disease on hemodialysis, coronary artery disease, hypertension, congestive heart failure, CVA, pulmonary hypertension, peripheral arterial disease, hyperlipidemia, GERD, glaucoma, COPD, left first toe amputation.   MWF [facility] [employer]   1. End Stage Renal Disease with Acute Pulmonary edema: on hemodialysis MWF.  -  HD today. Patient was panicky prior to starting HD. Calmed with fan and decreasing room temperature -  UF 2 kg as tolerated  2. Hypertension: with chest pain and elevated blood pressures.  Newly diagnosed left bundle branch block - Appreciate cardiology input.  - metoprolol, losartan  3. Anemia of chronic kidney disease: hemoglobin within normal limits.  - Continue Mircera as outpatient  4. Secondary Hyperparathyroidism:  - Calcium acetate with meals.   5. NSTEMI - cardiology team is following Patient is on iv heparin 2 D echo done today    LOS: 2 Shaima Sardinas 4/8/20199:53  AM

## 2018-02-13 NOTE — Progress Notes (Signed)
Supervisor called to get assistance to start new IV, patient stuck x2; patient with only one arm available for use, HD patient with Left AV fistula; has Heparin gtt going in right AC; unable to give IV solumedrol and IV Insulin at this time, until new site available; waiting for IV start. Nichole Hurst,Shemia Bevel K, RN

## 2018-02-13 NOTE — Progress Notes (Signed)
*  PRELIMINARY RESULTS* Echocardiogram 2D Echocardiogram has been performed.  Nichole Hurst 02/13/2018, 9:38 AM 

## 2018-02-14 ENCOUNTER — Encounter
Admission: EM | Disposition: A | Discharge status: Home / Self Care | Payer: Self-pay | Source: Home / Self Care | Attending: Internal Medicine

## 2018-02-14 DIAGNOSIS — I5021 Acute systolic (congestive) heart failure: Secondary | ICD-10-CM

## 2018-02-14 DIAGNOSIS — J441 Chronic obstructive pulmonary disease with (acute) exacerbation: Secondary | ICD-10-CM

## 2018-02-14 DIAGNOSIS — N186 End stage renal disease: Secondary | ICD-10-CM

## 2018-02-14 DIAGNOSIS — I10 Essential (primary) hypertension: Secondary | ICD-10-CM

## 2018-02-14 HISTORY — PX: RIGHT/LEFT HEART CATH AND CORONARY ANGIOGRAPHY: CATH118266

## 2018-02-14 LAB — BASIC METABOLIC PANEL
ANION GAP: 10 (ref 5–15)
BUN: 57 mg/dL — ABNORMAL HIGH (ref 6–20)
CO2: 27 mmol/L (ref 22–32)
Calcium: 8.9 mg/dL (ref 8.9–10.3)
Chloride: 97 mmol/L — ABNORMAL LOW (ref 101–111)
Creatinine, Ser: 6.17 mg/dL — ABNORMAL HIGH (ref 0.44–1.00)
GFR calc non Af Amer: 6 mL/min — ABNORMAL LOW (ref >60)
GFR, EST AFRICAN AMERICAN: 7 mL/min — AB (ref >60)
Glucose, Bld: 280 mg/dL — ABNORMAL HIGH (ref 65–99)
POTASSIUM: 5.6 mmol/L — AB (ref 3.5–5.1)
Sodium: 134 mmol/L — ABNORMAL LOW (ref 135–145)

## 2018-02-14 LAB — GLUCOSE, CAPILLARY
GLUCOSE-CAPILLARY: 290 mg/dL — AB (ref 65–99)
GLUCOSE-CAPILLARY: 271 mg/dL — AB (ref 65–99)
GLUCOSE-CAPILLARY: 476 mg/dL — AB (ref 65–99)

## 2018-02-14 LAB — CBC
HCT: 35.4 % (ref 35.0–47.0)
Hemoglobin: 11.1 g/dL — ABNORMAL LOW (ref 12.0–16.0)
MCH: 28.1 pg (ref 26.0–34.0)
MCHC: 31.3 g/dL — AB (ref 32.0–36.0)
MCV: 89.7 fL (ref 80.0–100.0)
Platelets: 183 10*3/uL (ref 150–440)
RBC: 3.95 MIL/uL (ref 3.80–5.20)
RDW: 20 % — ABNORMAL HIGH (ref 11.5–14.5)
WBC: 9.5 10*3/uL (ref 3.6–11.0)

## 2018-02-14 LAB — PROTIME-INR
INR: 1.17
Prothrombin Time: 14.8 seconds (ref 11.4–15.2)

## 2018-02-14 LAB — HEPARIN LEVEL (UNFRACTIONATED): Heparin Unfractionated: 0.29 IU/mL — ABNORMAL LOW (ref 0.30–0.70)

## 2018-02-14 SURGERY — RIGHT/LEFT HEART CATH AND CORONARY ANGIOGRAPHY
Anesthesia: Moderate Sedation

## 2018-02-14 MED ORDER — HEPARIN BOLUS VIA INFUSION
1050.0000 [IU] | Freq: Once | INTRAVENOUS | Status: AC
  Administered 2018-02-14: 1050 [IU] via INTRAVENOUS
  Filled 2018-02-14: qty 1050

## 2018-02-14 MED ORDER — SODIUM CHLORIDE 0.9% FLUSH: 3.0000 mL | INTRAVENOUS | Status: DC | PRN

## 2018-02-14 MED ORDER — HEPARIN SODIUM (PORCINE) 5000 UNIT/ML IJ SOLN: 5000.0000 [IU] | Freq: Three times a day (TID) | INTRAMUSCULAR | Status: DC

## 2018-02-14 MED ORDER — INSULIN ASPART 100 UNIT/ML ~~LOC~~ SOLN
0.0000 [IU] | Freq: Every day | SUBCUTANEOUS | Status: DC
  Administered 2018-02-14: 5 [IU] via SUBCUTANEOUS
  Administered 2018-02-15: 2 [IU] via SUBCUTANEOUS
  Filled 2018-02-14: qty 0.05
  Filled 2018-02-14 (×2): qty 1
  Filled 2018-02-14: qty 0.05

## 2018-02-14 MED ORDER — HYDRALAZINE HCL 20 MG/ML IJ SOLN
INTRAMUSCULAR | Status: AC
  Filled 2018-02-14: qty 1

## 2018-02-14 MED ORDER — MIDAZOLAM HCL 2 MG/2ML IJ SOLN
INTRAMUSCULAR | Status: DC | PRN
  Administered 2018-02-14: 0.5 mg via INTRAVENOUS

## 2018-02-14 MED ORDER — INSULIN GLARGINE 100 UNIT/ML ~~LOC~~ SOLN
12.0000 [IU] | Freq: Every day | SUBCUTANEOUS | Status: DC
  Administered 2018-02-15: 12 [IU] via SUBCUTANEOUS
  Filled 2018-02-14 (×2): qty 0.12

## 2018-02-14 MED ORDER — ISOSORBIDE MONONITRATE ER 30 MG PO TB24
15.0000 mg | ORAL_TABLET | Freq: Every day | ORAL | Status: DC
  Administered 2018-02-14: 15 mg via ORAL
  Filled 2018-02-14 (×2): qty 1

## 2018-02-14 MED ORDER — SODIUM CHLORIDE 0.9 % IV SOLN: 250.0000 mL | INTRAVENOUS | Status: DC | PRN

## 2018-02-14 MED ORDER — INSULIN ASPART 100 UNIT/ML ~~LOC~~ SOLN
4.0000 [IU] | Freq: Three times a day (TID) | SUBCUTANEOUS | Status: DC
  Administered 2018-02-15 (×2): 4 [IU] via SUBCUTANEOUS
  Filled 2018-02-14 (×2): qty 0.04
  Filled 2018-02-14: qty 1
  Filled 2018-02-14: qty 0.04
  Filled 2018-02-14: qty 1
  Filled 2018-02-14: qty 0.04

## 2018-02-14 MED ORDER — HEPARIN (PORCINE) IN NACL 2-0.9 UNIT/ML-% IJ SOLN
INTRAMUSCULAR | Status: AC
  Filled 2018-02-14: qty 500

## 2018-02-14 MED ORDER — INSULIN ASPART 100 UNIT/ML ~~LOC~~ SOLN
0.0000 [IU] | Freq: Three times a day (TID) | SUBCUTANEOUS | Status: DC
  Administered 2018-02-14: 8 [IU] via SUBCUTANEOUS
  Administered 2018-02-16: 3 [IU] via SUBCUTANEOUS
  Administered 2018-02-16: 8 [IU] via SUBCUTANEOUS
  Administered 2018-02-16: 5 [IU] via SUBCUTANEOUS
  Administered 2018-02-17: 2 [IU] via SUBCUTANEOUS
  Filled 2018-02-14: qty 0.15
  Filled 2018-02-14: qty 1
  Filled 2018-02-14 (×2): qty 0.15
  Filled 2018-02-14: qty 1
  Filled 2018-02-14: qty 0.15
  Filled 2018-02-14 (×3): qty 1

## 2018-02-14 MED ORDER — EPOETIN ALFA 10000 UNIT/ML IJ SOLN
4000.0000 [IU] | Freq: Once | INTRAMUSCULAR | Status: AC
  Administered 2018-02-14: 4000 [IU] via INTRAVENOUS

## 2018-02-14 MED ORDER — FENTANYL CITRATE (PF) 100 MCG/2ML IJ SOLN
INTRAMUSCULAR | Status: AC
  Filled 2018-02-14: qty 2

## 2018-02-14 MED ORDER — HYDRALAZINE HCL 20 MG/ML IJ SOLN
INTRAMUSCULAR | Status: DC | PRN
  Administered 2018-02-14 (×2): 5 mg via INTRAVENOUS

## 2018-02-14 MED ORDER — IOPAMIDOL (ISOVUE-300) INJECTION 61%
INTRAVENOUS | Status: DC | PRN
  Administered 2018-02-14: 75 mL via INTRA_ARTERIAL

## 2018-02-14 MED ORDER — ALPRAZOLAM 0.5 MG PO TABS
ORAL_TABLET | ORAL | Status: AC
  Filled 2018-02-14: qty 1

## 2018-02-14 MED ORDER — MIDAZOLAM HCL 2 MG/2ML IJ SOLN
INTRAMUSCULAR | Status: AC
  Filled 2018-02-14: qty 2

## 2018-02-14 MED ORDER — FENTANYL CITRATE (PF) 100 MCG/2ML IJ SOLN
INTRAMUSCULAR | Status: DC | PRN
  Administered 2018-02-14: 25 ug via INTRAVENOUS

## 2018-02-14 MED ORDER — METOPROLOL SUCCINATE ER 50 MG PO TB24
50.0000 mg | ORAL_TABLET | Freq: Two times a day (BID) | ORAL | Status: DC
  Administered 2018-02-15 – 2018-02-16 (×3): 50 mg via ORAL
  Filled 2018-02-14 (×4): qty 1

## 2018-02-14 MED ORDER — SODIUM CHLORIDE 0.9% FLUSH
3.0000 mL | Freq: Two times a day (BID) | INTRAVENOUS | Status: DC
  Administered 2018-02-15 – 2018-02-16 (×5): 3 mL via INTRAVENOUS

## 2018-02-14 SURGICAL SUPPLY — 16 items
CANNULA 5F STIFF (CANNULA) ×3 IMPLANT
CATH INFINITI 5 FR IM (CATHETERS) ×3 IMPLANT
CATH INFINITI 5FR ANG PIGTAIL (CATHETERS) ×3 IMPLANT
CATH INFINITI 5FR JL4 (CATHETERS) ×3 IMPLANT
CATH INFINITI JR4 5F (CATHETERS) ×3 IMPLANT
CATH SWANZ 7F THERMO (CATHETERS) ×3 IMPLANT
DEVICE SAFEGUARD 24CM (GAUZE/BANDAGES/DRESSINGS) ×3 IMPLANT
GUIDEWIRE ANGLED .035 180CM (WIRE) ×3 IMPLANT
GUIDEWIRE EMER 3M J .025X150CM (WIRE) ×3 IMPLANT
KIT MANI 3VAL PERCEP (MISCELLANEOUS) ×3 IMPLANT
KIT RIGHT HEART (MISCELLANEOUS) ×3 IMPLANT
PACK CARDIAC CATH (CUSTOM PROCEDURE TRAY) ×3 IMPLANT
SHEATH AVANTI 5FR X 11CM (SHEATH) ×3 IMPLANT
SHEATH PINNACLE 7F 10CM (SHEATH) ×3 IMPLANT
WIRE EMERALD 3MM-J .035X260CM (WIRE) ×3 IMPLANT
WIRE GUIDERIGHT .035X150 (WIRE) ×3 IMPLANT

## 2018-02-14 NOTE — Progress Notes (Signed)
Pre hd 

## 2018-02-14 NOTE — Progress Notes (Signed)
Progress Note  Patient Name: Nichole Hurst Date of Encounter: 02/14/2018  Primary Cardiologist:  [facility]  Subjective   Patient still complains of shortness of breath, orthopnea, and intermittent chest tightness, improved from admission.  Inpatient Medications    Scheduled Meds: . ALPRAZolam      . aspirin EC  81 mg Oral QHS  . atorvastatin  80 mg Oral QPM  . calcium acetate  1,334 mg Oral TID WC  . Difluprednate  1 drop Left Eye Daily  . docusate sodium  100 mg Oral BID  . feeding supplement (NEPRO CARB STEADY)  237 mL Oral BID BM  . ferrous sulfate  325 mg Oral Q breakfast  . heparin  5,000 Units Subcutaneous Q8H  . insulin aspart  0-15 Units Subcutaneous TID WC  . insulin aspart  0-5 Units Subcutaneous QHS  . insulin aspart  4 Units Subcutaneous TID WC  . [START ON 02/15/2018] insulin glargine  12 Units Subcutaneous Daily  . isosorbide mononitrate  15 mg Oral Daily  . latanoprost  1 drop Both Eyes QHS  . levalbuterol  1.25 mg Nebulization Q6H  . losartan  100 mg Oral QHS  . methylPREDNISolone (SOLU-MEDROL) injection  60 mg Intravenous Q6H  . metoprolol succinate  50 mg Oral BID  . moxifloxacin  1 drop Left Eye QID  . multivitamin  1 tablet Oral QHS  . pantoprazole  40 mg Oral Daily  . sodium chloride flush  3 mL Intravenous Q12H  . ticagrelor  60 mg Oral BID  . timolol  1 drop Left Eye BID  . vitamin C  250 mg Oral BID   Continuous Infusions: . sodium chloride     PRN Meds: sodium chloride, albuterol, ALPRAZolam, alum & mag hydroxide-simeth, chlorpheniramine-HYDROcodone, diphenhydrAMINE, fluticasone, nitroGLYCERIN, ondansetron **OR** ondansetron (ZOFRAN) IV, ondansetron, oxyCODONE, sodium chloride flush, traMADol   Vital Signs    Vitals:   02/14/18 1715 02/14/18 1730 02/14/18 1745 02/14/18 1753  BP: 140/69 (!) 132/100 126/63 (!) 141/74  Pulse: 84 78 80 81  Resp: 14 19 15 16   Temp:   98.3 F (36.8 C)   TempSrc:   Oral   SpO2:   95%   Weight:      Height:          Intake/Output Summary (Last 24 hours) at 02/14/2018 1834 Last data filed at 02/14/2018 1745 Gross per 24 hour  Intake 311.36 ml  Output 3000 ml  Net -2688.64 ml   Filed Weights   02/13/18 1320 02/14/18 0858 02/14/18 1445  Weight: 154 lb 1.6 oz (69.9 kg) 154 lb (69.9 kg) 156 lb 8.4 oz (71 kg)    Telemetry    Normal sinus rhythm- Personally Reviewed  ECG    No new tracing  Physical Exam   GEN:  Anxious appearing but otherwise in no distress. Neck:  Fickle to assess.  No gross JVD. Cardiac:  Distant heart sounds.  Regular rate and rhythm with 1/6 systolic murmur. Respiratory:  Widely diminished breath sounds throughout without wheezes or crackles. GI:  Obese, soft, and nontender.  Unable to assess HSM due to body habitus. MS:  Trace pretibial edema bilaterally; No deformity. Neuro:  Nonfocal  Psych: Anxious but appropriate.  Labs    Chemistry Recent Labs  Lab 02/11/18 1659 02/11/18 2136 02/12/18 0338 02/14/18 0631  NA 136  --  135 134*  K 4.6  --  4.9 5.6*  CL 92*  --  92* 97*  CO2 32  --  25 27  GLUCOSE 245*  --  326* 280*  BUN 33*  --  41* 57*  CREATININE 6.58* 6.70* 7.09* 6.17*  CALCIUM 9.7  --  9.3 8.9  GFRNONAA 6* 6* 5* 6*  GFRAA 7* 7* 6* 7*  ANIONGAP 12  --  18* 10     Hematology Recent Labs  Lab 02/12/18 0338 02/13/18 0327 02/14/18 0631  WBC 5.3 11.8* 9.5  RBC 4.47 4.18 3.95  HGB 12.4 11.9* 11.1*  HCT 39.7 36.9 35.4  MCV 88.8 88.3 89.7  MCH 27.8 28.3 28.1  MCHC 31.3* 32.1 31.3*  RDW 20.4* 20.2* 20.0*  PLT 171 186 183    Cardiac Enzymes Recent Labs  Lab 02/12/18 1334 02/12/18 2242 02/13/18 0327 02/13/18 1043  TROPONINI 5.80* 6.02* 6.93* 5.62*   No results for input(s): TROPIPOC in the last 168 hours.   BNPNo results for input(s): BNP, PROBNP in the last 168 hours.   DDimer No results for input(s): DDIMER in the last 168 hours.   Radiology    No results found.  Cardiac Studies   LHC/RHC (02/14/18): 1. Severe native coronary  disease, including diffusely calcified LAD and dominant LCx with 80-90% lesions. 2. Widely patent LIMA-LAD and SVG-OM.  The SVG-OM graft does not adequately supply the distal LCx and its PL/PDA branches. 3. Severely elevated left heart, right heart, and pulmonary artery pressures. 4. Normal Fick cardiac output/index.  Echo (02/13/18): - Left ventricle: The cavity size was normal. There was moderate   concentric hypertrophy. Systolic function was moderately to   severely reduced. The estimated ejection fraction was in the   range of 30% to 35%. The study is not technically sufficient to   allow evaluation of LV diastolic function. - Mitral valve: Severely calcified annulus. Mildly thickened,   moderately calcified leaflets . There was moderate regurgitation. - Left atrium: The atrium was mildly to moderately dilated. - Tricuspid valve: There was moderate regurgitation. - Pulmonary arteries: Systolic pressure was moderately increased.   PA peak pressure: 50 mm Hg (S).  Patient Profile     67 y.o. female history of coronary artery disease status post two-vessel CABG in July 2017 (LIMA to LAD and SVG to OM1) in 2017, tachybradycardia syndrome status post pacemaker placement in 2017, Marquis Diles-stage renal disease on hemodialysis, chronic diastolic heart failure with pulmonary hypertension, peripheral arterial disease, anemia of chronic disease, diabetes mellitus, hypertension and hyperlipidemia who presented with atypical chest pain and shortness of breath and was found to have a non-ST elevation myocardial infarction.  Assessment & Plan    NSTEMI No significant chest pain reported today, though Nichole Hurst still has intermittent shortness of breath and chest tightness.  Catheterization shows patent bypass grafts in the setting of severe native CAD.  I suspect that her NSTEMI is the result of supply-demand mismatch in the setting of severe native CAD.  The mid/distal LCx and its PL/PDA branches are not  revascularized by CABG.  Continue aggressive medical therapy including dual antiplatelet therapy with aspirin and low-dose ticagrelor.  Optimize heart failure regimen, as I suspect that the patient's elevated troponin was most likely due to supply-demand mismatch.  Aggressive secondary prevention including lipid and glucose control.  Add isosorbide mononitrate 15 mg daily, to be uptitrated as blood pressure allows.  If Nichole Hurst has refractory symptoms despite optimal medical therapy and maintenance of a euvolemic state, PCI to the LCx would need to be considered.  Given diffuse calcification, this would likely need to involve atherectomy.  Acute systolic heart failure Left and right heart catheterization today shows severely elevated left heart, right heart, and pulmonary artery pressures.  I suspect this is the driving force behind Nichole Hurst his symptoms and demand ischemia.  Escalate fluid removal with hemodialysis.  Patient currently receiving HD again today.  She will likely need several liters removed before she is ready for discharge.  Increase metoprolol succinate to 50 mg twice daily.  Continue losartan 100 mg daily.  Hypertension Blood pressure poorly controlled today, though dialysis has been limited by hypotension during HD.  Increase metoprolol and add low-dose isosorbide mononitrate, as above.  Hyperlipidemia:  Continue atorvastatin.  Abou Sterkel-stage renal disease  As above, fluid removal needs to be escalated.  COPD No significant wheezing on exam today.  Recommend rapid de-escalation/discontinuation of systemic steroids.  For questions or updates, please contact [facility] Please consult [email or web address] for contact info under [facility].     Signed, Yvonne Kendall, MD  02/14/2018, 6:34 PM

## 2018-02-14 NOTE — Consult Note (Signed)
ANTICOAGULATION CONSULT NOTE  Pharmacy Consult for Heparin Drip  Indication: chest pain/ACS  Allergies  Allergen Reactions  . Colchicine Anaphylaxis  . Clopidogrel    Patient Measurements: Height: 5\' 1"  (154.9 cm) Weight: 154 lb 1.6 oz (69.9 kg) IBW/kg (Calculated) : 47.8  Heparin dosing weight: 62 kg  Vital Signs: Temp: 97.6 F (36.4 C) (04/08 2050) Temp Source: Oral (04/08 1437) BP: 119/69 (04/08 2224) Pulse Rate: 87 (04/08 2224)  Labs: Recent Labs    02/11/18 1659  02/11/18 2136 02/12/18 0338  02/12/18 1334  02/12/18 2242 02/13/18 0327 02/13/18 1043 02/13/18 1314 02/13/18 2356  HGB 12.8  --  12.7 12.4  --   --   --   --  11.9*  --   --   --   HCT 40.2  --  39.1 39.7  --   --   --   --  36.9  --   --   --   PLT 198  --  179 171  --   --   --   --  186  --   --   --   APTT  --   --   --   --   --  30  --   --   --   --   --   --   LABPROT  --   --   --   --   --  13.9  --   --   --   --   --   --   INR  --   --   --   --   --  1.08  --   --   --   --   --   --   HEPARINUNFRC  --   --   --   --   --  <0.10*   < >  --  0.24*  --  0.23* 0.29*  CREATININE 6.58*  --  6.70* 7.09*  --   --   --   --   --   --   --   --   TROPONINI 0.04*   < > 0.10* 0.69*   < > 5.80*  --  6.02* 6.93* 5.62*  --   --    < > = values in this interval not displayed.    Estimated Creatinine Clearance: 7 mL/min (A) (by C-G formula based on SCr of 7.09 mg/dL (H)).   Medical History: Past Medical History:  Diagnosis Date  . Anemia of chronic disease   . CAD (coronary artery disease)    a. s/p 2-V CABG 05/2016 (LIMA-LAD, VG-OM1)  . Carotid stenosis   . Charcot's gait    FOOT  . Claustrophobia   . Colon polyp   . Diabetes mellitus with complication (HCC)   . Emphysema/COPD (HCC)   . ESRD (end stage renal disease) on dialysis (HCC)    DIAYLISIS M/W/F  . Fractures    Lt foot  . GERD (gastroesophageal reflux disease)   . Glaucoma   . History of kidney stones   . Hyperlipidemia   .  Hypertension   . Migraine   . Orthopnea   . Oxygen deficiency    AS NEEDED  . PAD (peripheral artery disease) (HCC)   . Pain    JOINTS  . Presence of permanent cardiac pacemaker    [employer]. [employer]   . Pulmonary hypertension (HCC)    a. TTE 10/18: EF 55-60%, no RWMA, Gr2DD,  mild to moderate MR, mildly dilated left atrium, mildly dilated RV with normal RVSF, mildly dilated RA, severe TR, PASP 60-65 mmHg  . Retinopathy    DIABETIC  . Stroke [facility]) 09/2015   Ischemic stroke with R sided hemiparesis   Assessment: Pharmacy consulted for heparin drip dosing and monitoring in 66yo female for ACS/NSTEMI.  Troponin 3.09.  Goal of Therapy:  Heparin level 0.3-0.7 units/ml Monitor platelets by anticoagulation protocol: Yes   Plan:  4/8 heparin level @1314  subtherapeutic=0.23 Will give heparin bolus 950 units and increase heparin drip to 1050 units/hr Will check next heparin level in 8 hours  Continue to follow H&H and platelets   04/08 @ 0000 HL 0.29 subtherapeutic. Will rebolus w/ heparin 1050 units IV x 1 and will increase rate to 1200 units/hr and will recheck HL @ 0800  Thomasene Ripple, PharmD, BCPS Clinical Pharmacist 02/14/2018

## 2018-02-14 NOTE — Progress Notes (Signed)
[facility]  ROUNDING NOTE   Subjective:   [name] Hurst [facility] to [facility]/2018 for Atypical chest pain [R07.89] Chest pain [R07.9] Chronic obstructive pulmonary disease, unspecified COPD type (HCC) [J44.9]    HEMODIALYSIS FLOWSHEET:  Blood Flow Rate (mL/min): 300 mL/min Arterial Pressure (mmHg): -180 mmHg Venous Pressure (mmHg): 180 mmHg Transmembrane Pressure (mmHg): 70 mmHg Ultrafiltration Rate (mL/min): 1000 mL/min Dialysate Flow Rate (mL/min): 600 ml/min Conductivity: Machine : 14 Conductivity: Machine : 14 Dialysis Fluid Bolus: Normal Saline Bolus Amount (mL): 250 mL Dialysate Change: 2K  Seen during HD Tolerating well  Objective:  Vital signs in last 24 hours:  Temp:  [97.4 F (36.3 C)-98.2 F (36.8 C)] 98.2 F (36.8 C) (04/09 1445) Pulse Rate:  [69-97] 84 (04/09 1615) Resp:  [7-22] 15 (04/09 1615) BP: (98-155)/(61-98) 142/98 (04/09 1615) SpO2:  [92 %-100 %] 95 % (04/09 1445) Weight:  [154 lb (69.9 kg)-156 lb 8.4 oz (71 kg)] 156 lb 8.4 oz (71 kg) (04/09 1445)  Weight change:  Filed Weights   02/13/18 1320 02/14/18 0858 02/14/18 1445  Weight: 154 lb 1.6 oz (69.9 kg) 154 lb (69.9 kg) 156 lb 8.4 oz (71 kg)    Intake/Output: I/O last 3 completed shifts: In: 367.6 [I.V.:367.6] Out: 279 [Urine:50; Other:229]   Intake/Output this shift:  Total I/O In: 24.8 [I.V.:24.8] Out: -   Physical Exam: General: NAD, sitting   Head: Normocephalic, atraumatic. Moist oral mucosal membranes  Eyes: Anicteric,    Neck: Supple,    Lungs:  Clear today  Heart: Regular rate and rhythm  Abdomen:  Soft, nontender,   Extremities: Trace peripheral edema.    Neurologic: Nonfocal, moving all four extremities  Skin: Healing left first toe ulcer  Access: Left upper arm AVF    Basic Metabolic Panel: Recent Labs  Lab 02/11/18 1659 02/11/18 2136 02/12/18 0338 02/14/18 0631  NA 136  --  135 134*  K 4.6  --  4.9 5.6*  CL 92*  --  92* 97*  CO2 32   --  25 27  GLUCOSE 245*  --  326* 280*  BUN 33*  --  41* 57*  CREATININE 6.58* 6.70* 7.09* 6.17*  CALCIUM 9.7  --  9.3 8.9    Liver Function Tests: No results for input(s): AST, ALT, ALKPHOS, BILITOT, PROT, ALBUMIN in the last 168 hours. No results for input(s): LIPASE, AMYLASE in the last 168 hours. No results for input(s): AMMONIA in the last 168 hours.  CBC: Recent Labs  Lab 02/11/18 1659 02/11/18 2136 02/12/18 0338 02/13/18 0327 02/14/18 0631  WBC 6.2 5.8 5.3 11.8* 9.5  HGB 12.8 12.7 12.4 11.9* 11.1*  HCT 40.2 39.1 39.7 36.9 35.4  MCV 88.2 88.3 88.8 88.3 89.7  PLT 198 179 171 186 183    Cardiac Enzymes: Recent Labs  Lab 02/12/18 0907 02/12/18 1334 02/12/18 2242 02/13/18 0327 02/13/18 1043  TROPONINI 3.09* 5.80* 6.02* 6.93* 5.62*    BNP: Invalid input(s): POCBNP  CBG: Recent Labs  Lab 02/13/18 0743 02/13/18 1440 02/13/18 1644 02/13/18 2214 02/14/18 0749  GLUCAP 375* 244* 267* 353* 290*    Microbiology: Results for orders placed or performed during the hospital encounter of 11/28/17  Surgical PCR screen     Status: None   Collection Time: 11/28/17  5:37 PM  Result Value Ref Range Status   MRSA, PCR NEGATIVE NEGATIVE Final   Staphylococcus aureus NEGATIVE NEGATIVE Final    Comment: (NOTE) The Xpert SA Assay (FDA approved for NASAL  specimens in patients 73 years of age and older), is one component of a comprehensive surveillance program. It is not intended to diagnose infection nor to guide or monitor treatment. Performed at [facility], [address] [place], [place] [address]   Aerobic/Anaerobic Culture (surgical/deep wound)     Status: None   Collection Time: 11/29/17  1:33 PM  Result Value Ref Range Status   Specimen Description   Final    BONE LEFT TOE GREAT TOE Performed at [facility], [address], [place], [place] [address]    Special Requests   Final    NONE Performed at [facility], [address] [place], [place] [address]    Gram Stain   Final    FEW WBC PRESENT, PREDOMINANTLY PMN RARE GRAM POSITIVE COCCI    Culture   Final    FEW KLEBSIELLA PNEUMONIAE FEW STAPHYLOCOCCUS AUREUS NO ANAEROBES ISOLATED Performed at [facility], [address] [place], [place] [address]    Report Status 12/04/2017 FINAL  Final   Organism ID, Bacteria KLEBSIELLA PNEUMONIAE  Final   Organism ID, Bacteria STAPHYLOCOCCUS AUREUS  Final      Susceptibility   Klebsiella pneumoniae - MIC*    AMPICILLIN RESISTANT Resistant     CEFAZOLIN <=4 SENSITIVE Sensitive     CEFEPIME <=1 SENSITIVE Sensitive     CEFTAZIDIME <=1 SENSITIVE Sensitive     CEFTRIAXONE <=1 SENSITIVE Sensitive     CIPROFLOXACIN <=0.25 SENSITIVE Sensitive     GENTAMICIN <=1 SENSITIVE Sensitive     IMIPENEM <=0.25 SENSITIVE Sensitive     TRIMETH/SULFA <=20 SENSITIVE Sensitive     AMPICILLIN/SULBACTAM 4 SENSITIVE Sensitive     PIP/TAZO <=4 SENSITIVE Sensitive     Extended ESBL NEGATIVE Sensitive     * FEW KLEBSIELLA PNEUMONIAE   Staphylococcus aureus - MIC*    CIPROFLOXACIN <=0.5 SENSITIVE Sensitive     ERYTHROMYCIN <=0.25 SENSITIVE Sensitive     GENTAMICIN <=0.5 SENSITIVE Sensitive     OXACILLIN 0.5 SENSITIVE Sensitive     TETRACYCLINE <=1 SENSITIVE Sensitive     VANCOMYCIN 1 SENSITIVE Sensitive     TRIMETH/SULFA <=10 SENSITIVE Sensitive     CLINDAMYCIN <=0.25 SENSITIVE Sensitive     RIFAMPIN <=0.5 SENSITIVE Sensitive     Inducible Clindamycin NEGATIVE Sensitive     * FEW STAPHYLOCOCCUS AUREUS    Coagulation Studies: Recent Labs    02/12/18 1334 02/14/18 0631  LABPROT 13.9 14.8  INR 1.08 1.17    Urinalysis: No results for input(s): COLORURINE, LABSPEC, PHURINE, GLUCOSEU, HGBUR, BILIRUBINUR, KETONESUR, PROTEINUR, UROBILINOGEN, NITRITE, LEUKOCYTESUR in the last 72 hours.  Invalid input(s): APPERANCEUR    Imaging: No results found.   Medications:   . sodium chloride     . ALPRAZolam      . aspirin  EC  81 mg Oral QHS  . atorvastatin  80 mg Oral QPM  . calcium acetate  1,334 mg Oral TID WC  . Difluprednate  1 drop Left Eye Daily  . docusate sodium  100 mg Oral BID  . feeding supplement (NEPRO CARB STEADY)  237 mL Oral BID BM  . ferrous sulfate  325 mg Oral Q breakfast  . heparin  5,000 Units Subcutaneous Q8H  . insulin aspart  0-15 Units Subcutaneous TID WC  . insulin aspart  0-5 Units Subcutaneous QHS  . insulin aspart  4 Units Subcutaneous TID WC  . [START ON 02/15/2018] insulin glargine  12 Units Subcutaneous Daily  .  isosorbide mononitrate  15 mg Oral Daily  . latanoprost  1 drop Both Eyes QHS  . levalbuterol  1.25 mg Nebulization Q6H  . losartan  100 mg Oral QHS  . methylPREDNISolone (SOLU-MEDROL) injection  60 mg Intravenous Q6H  . metoprolol succinate  50 mg Oral BID  . moxifloxacin  1 drop Left Eye QID  . multivitamin  1 tablet Oral QHS  . pantoprazole  40 mg Oral Daily  . sodium chloride flush  3 mL Intravenous Q12H  . ticagrelor  60 mg Oral BID  . timolol  1 drop Left Eye BID  . vitamin C  250 mg Oral BID   sodium chloride, albuterol, ALPRAZolam, alum & mag hydroxide-simeth, chlorpheniramine-HYDROcodone, diphenhydrAMINE, fluticasone, nitroGLYCERIN, ondansetron **OR** ondansetron (ZOFRAN) IV, ondansetron, oxyCODONE, sodium chloride flush, traMADol  Assessment/ Plan:  Nichole Hurst is a 67 y.o. white female with end stage renal disease on hemodialysis, coronary artery disease, hypertension, congestive heart failure, CVA, pulmonary hypertension, peripheral arterial disease, hyperlipidemia, GERD, glaucoma, COPD, left first toe amputation.   MWF [facility] Nephrology [employer]   1. End Stage Renal Disease with Acute Pulmonary edema: on hemodialysis MWF.  -  Extra HD today. Goal UF 3 L as tolerated - CXR in AM to reassess  2.  NSTEMI - cardiology team is following - Echo : LVEF 30-35%, mod Pulm HTN, mod MR - Cardiac cath today- Multivessel CAD.  - optimize  medical therapy and volume status - Isosorbide status  3. Anemia of chronic kidney disease: hemoglobin within normal limits.  - Continue Mircera as outpatient - Hgb 11.1 (at goal)  4. Secondary Hyperparathyroidism:  - Calcium acetate with meals.      LOS: 3 Nichole Hurst Thedore Mins 4/9/20194:38 PM

## 2018-02-14 NOTE — Progress Notes (Signed)
Post hd assessment unchanged  

## 2018-02-14 NOTE — Progress Notes (Signed)
Hemodialysis completed without issue. Patient tolerated well. Total UF 3L without issue. Report called to primary RN.

## 2018-02-14 NOTE — Interval H&P Note (Signed)
History and Physical Interval Note:  02/14/2018 10:08 AM  Nichole EvensGloria Vasseur  has presented today for cardiac catheterization, with the diagnosis of NSTEMI, Acute systolic heart failure  The various methods of treatment have been discussed with the patient and family. After consideration of risks, benefits and other options for treatment, the patient has consented to  Procedure(s): RIGHT/LEFT HEART CATH AND CORONARY/graft  ANGIOGRAPHY (N/A) as a surgical intervention .  The patient's history has been reviewed, patient examined, no change in status, stable for surgery.  I have reviewed the patient's chart and labs.  Questions were answered to the patient's satisfaction.    Cath Lab Visit (complete for each Cath Lab visit)  Clinical Evaluation Leading to the Procedure:   ACS: Yes.    Non-ACS:  N/A  Zayyan Mullen

## 2018-02-14 NOTE — Progress Notes (Signed)
[employer] [facility] [place] [facility]                                                                                                                                                                                  Patient Demographics   Nichole EvensGloria Hurst, is a 67 y.o. female, DOB - 08/24/1951, ONG:[id number]  Admit date - 02/11/2018   Admitting Physician Ramonita LabAruna Gouru, MD  Outpatient [facility] the [facility]   LOS - 3  Subjective: Patient seen and evaluated today at dialysis Has some shortness of breath Chest tight ness better today Getting dialysis today   Review of Systems:   CONSTITUTIONAL: No documented fever. No fatigue, weakness. No weight gain, no weight loss.  EYES: No blurry or double vision.  ENT: No tinnitus. No postnasal drip. No redness of the oropharynx.  RESPIRATORY: Has decreased cough, has wheezing, no hemoptysis. No dyspnea.  CARDIOVASCULAR: Has chest tightness. No orthopnea. No palpitations. No syncope.  GASTROINTESTINAL: No nausea, no vomiting or diarrhea. No abdominal pain. No melena or hematochezia.  GENITOURINARY: No dysuria or hematuria.  ENDOCRINE: No polyuria or nocturia. No heat or cold intolerance.  HEMATOLOGY: No anemia. No bruising. No bleeding.  INTEGUMENTARY: No rashes. No lesions.  MUSCULOSKELETAL: No arthritis. No swelling. No gout.  NEUROLOGIC: No numbness, tingling, or ataxia. No seizure-type activity.  PSYCHIATRIC: No anxiety. No insomnia. No ADD.    Vitals:   Vitals:   02/14/18 1445 02/14/18 1448 02/14/18 1500 02/14/18 1515  BP: (!) 147/74 125/70 (!) 142/66 (!) 150/75  Pulse: 80 81 81 80  Resp: 15 15 14 14   Temp: 98.2 F (36.8 C)     TempSrc: Oral     SpO2: 95%     Weight: 71 kg (156 lb 8.4 oz)     Height:        Wt Readings from Last 3 Encounters:  02/14/18 71 kg (156 lb 8.4 oz)  01/31/18 66.7 kg (147 lb)  11/30/17 70.3 kg (154 lb 15.7 oz)     Intake/Output Summary (Last 24 hours)  at 02/14/2018 1526 Last data filed at 02/14/2018 0830 Gross per 24 hour  Intake 311.36 ml  Output -  Net 311.36 ml    Physical Exam:   GENERAL: Pleasant-appearing female patient in no apparent distress on oxygen via nasal canula HEAD, EYES, EARS, NOSE AND THROAT: Atraumatic, normocephalic. Extraocular muscles are intact. Pupils equal and reactive to light. Sclerae anicteric. No conjunctival injection. No oro-pharyngeal erythema.  NECK: Supple. There is no jugular venous distention. No bruits, no lymphadenopathy, no thyromegaly.  HEART: Regular rate and rhythm,. No murmurs, no rubs, no clicks.  LUNGS:Improved  airflow in both lungs, bilateral wheezing improved ABDOMEN: Soft, flat, nontender, nondistended. Has good bowel sounds. No hepatosplenomegaly appreciated.  EXTREMITIES: No evidence of any cyanosis, clubbing, or peripheral edema.  +2 pedal and radial pulses bilaterally.  NEUROLOGIC: The patient is alert, awake, and oriented x3 with no focal motor or sensory deficits appreciated bilaterally.  SKIN: Moist and warm with no rashes appreciated.  Psych: Not anxious, depressed LN: No inguinal LN enlargement    Antibiotics   Anti-infectives (From admission, onward)   Start     Dose/Rate Route Frequency Ordered Stop   02/11/18 2130  azithromycin (ZITHROMAX) tablet 500 mg     500 mg Oral Daily 02/11/18 2127 02/13/18 1513      Medications   Scheduled Meds: . ALPRAZolam      . aspirin EC  81 mg Oral QHS  . atorvastatin  80 mg Oral QPM  . calcium acetate  1,334 mg Oral TID WC  . Difluprednate  1 drop Left Eye Daily  . docusate sodium  100 mg Oral BID  . feeding supplement (NEPRO CARB STEADY)  237 mL Oral BID BM  . ferrous sulfate  325 mg Oral Q breakfast  . heparin  5,000 Units Subcutaneous Q8H  . insulin aspart  0-15 Units Subcutaneous TID WC  . insulin aspart  0-5 Units Subcutaneous QHS  . insulin aspart  4 Units Subcutaneous TID WC  . [START ON 02/15/2018] insulin glargine  12  Units Subcutaneous Daily  . isosorbide mononitrate  15 mg Oral Daily  . latanoprost  1 drop Both Eyes QHS  . levalbuterol  1.25 mg Nebulization Q6H  . losartan  100 mg Oral QHS  . methylPREDNISolone (SOLU-MEDROL) injection  60 mg Intravenous Q6H  . metoprolol succinate  50 mg Oral BID  . moxifloxacin  1 drop Left Eye QID  . multivitamin  1 tablet Oral QHS  . pantoprazole  40 mg Oral Daily  . sodium chloride flush  3 mL Intravenous Q12H  . ticagrelor  60 mg Oral BID  . timolol  1 drop Left Eye BID  . vitamin C  250 mg Oral BID   Continuous Infusions: . sodium chloride     PRN Meds:.sodium chloride, albuterol, ALPRAZolam, alum & mag hydroxide-simeth, chlorpheniramine-HYDROcodone, diphenhydrAMINE, fluticasone, nitroGLYCERIN, ondansetron **OR** ondansetron (ZOFRAN) IV, ondansetron, oxyCODONE, sodium chloride flush, traMADol   Data Review:   Micro Results No results found for this or any previous visit (from the past 240 hour(s)).  Radiology Reports Ct Angio Chest Pe W And/or Wo Contrast  Result Date: 02/11/2018 CLINICAL DATA:  67 year old female presents with left-sided chest pain radiating to the back x2 days worse with deep inspiration. Reports cough. EXAM: CT ANGIOGRAPHY CHEST WITH CONTRAST TECHNIQUE: Multidetector CT imaging of the chest was performed using the standard protocol during bolus administration of intravenous contrast. Multiplanar CT image reconstructions and MIPs were obtained to evaluate the vascular anatomy. CONTRAST:  75mL OMNIPAQUE IOHEXOL 350 MG/ML SOLN COMPARISON:  02/11/2018 CXR FINDINGS: Cardiovascular: Heart is top normal with aortic atherosclerosis. No aneurysm. Right-sided pacemaker apparatus with right atrial and right ventricular leads are noted. The patient is status post CABG. Moderate atherosclerosis of the great vessels with normal branch pattern off the thoracic aorta. No aortic aneurysm. No apparent dissection though study is tailored for the assessment of  the pulmonary arterial system. No acute pulmonary embolus to the segmental branches. Dilatation of the main pulmonary artery to 3.5 cm consistent with changes of chronic pulmonary hypertension. Mediastinum/Nodes: Mild left  lower paratracheal lymphadenopathy up to 19 mm short axis with smaller right lower paratracheal lymph nodes measuring up to 8 mm short axis. Patent trachea and mainstem bronchi. Esophagus is unremarkable. No thyromegaly or mass. Lungs/Pleura: No acute pulmonary consolidation, effusion or pneumothorax. No dominant mass. Upper Abdomen: No acute abnormality Musculoskeletal: Schmorl's nodes involving several upper thoracic vertebrae. No acute nor suspicious osseous abnormality. Review of the MIP images confirms the above findings. IMPRESSION: 1. Moderate thoracic aortic and branch vessel atherosclerosis. 2. Status post CABG with native coronary arteriosclerosis. 3. No active pulmonary disease.  No acute pulmonary embolus. 4. Mild mediastinal adenopathy, nonspecific possibly reactive in etiology. Aortic Atherosclerosis (ICD10-I70.0). Electronically Signed   By: Tollie Eth M.D.   On: 02/11/2018 19:06   Dg Chest Port 1 View  Result Date: 02/11/2018 CLINICAL DATA:  Left-sided chest pain.  Shortness of breath. EXAM: PORTABLE CHEST 1 VIEW COMPARISON:  August 08, 2017 FINDINGS: Stable pacemaker. The heart size borderline. The hila and mediastinum are normal. No pulmonary nodules or masses. No focal infiltrates. IMPRESSION: No active disease. Electronically Signed   By: Gerome Sam III M.D   On: 02/11/2018 17:34     CBC Recent Labs  Lab 02/11/18 1659 02/11/18 2136 02/12/18 0338 02/13/18 0327 02/14/18 0631  WBC 6.2 5.8 5.3 11.8* 9.5  HGB 12.8 12.7 12.4 11.9* 11.1*  HCT 40.2 39.1 39.7 36.9 35.4  PLT 198 179 171 186 183  MCV 88.2 88.3 88.8 88.3 89.7  MCH 27.9 28.6 27.8 28.3 28.1  MCHC 31.7* 32.4 31.3* 32.1 31.3*  RDW 19.8* 20.0* 20.4* 20.2* 20.0*    Chemistries  Recent Labs  Lab  02/11/18 1659 02/11/18 2136 02/12/18 0338 02/14/18 0631  NA 136  --  135 134*  K 4.6  --  4.9 5.6*  CL 92*  --  92* 97*  CO2 32  --  25 27  GLUCOSE 245*  --  326* 280*  BUN 33*  --  41* 57*  CREATININE 6.58* 6.70* 7.09* 6.17*  CALCIUM 9.7  --  9.3 8.9   ------------------------------------------------------------------------------------------------------------------ estimated creatinine clearance is 8.1 mL/min (A) (by C-G formula based on SCr of 6.17 mg/dL (H)). ------------------------------------------------------------------------------------------------------------------ Recent Labs    02/12/18 0338  HGBA1C 8.1*   ------------------------------------------------------------------------------------------------------------------ No results for input(s): CHOL, HDL, LDLCALC, TRIG, CHOLHDL, LDLDIRECT in the last 72 hours. ------------------------------------------------------------------------------------------------------------------ No results for input(s): TSH, T4TOTAL, T3FREE, THYROIDAB in the last 72 hours.  Invalid input(s): FREET3 ------------------------------------------------------------------------------------------------------------------ No results for input(s): VITAMINB12, FOLATE, FERRITIN, TIBC, IRON, RETICCTPCT in the last 72 hours.  Coagulation profile Recent Labs  Lab 02/12/18 1334 02/14/18 0631  INR 1.08 1.17    No results for input(s): DDIMER in the last 72 hours.  Cardiac Enzymes Recent Labs  Lab 02/12/18 2242 02/13/18 0327 02/13/18 1043  TROPONINI 6.02* 6.93* 5.62*   ------------------------------------------------------------------------------------------------------------------ Invalid input(s): POCBNP    Assessment & Plan   67 year old female patient with history of end-stage renal disease on dialysis, COPD, hyperlipidemia, non-insulin-dependent diabetes mellitus, coronary artery disease, CABG currently under hospitalist service for COPD  exacerbation and elevated troponin and chest tightness.  1.  Elevated troponin with left bundle branch block on EKG with chest tightness Status post cardiology evaluation Cardiac cath done today, f/u report Continue aspirin, Brilinta, Lipitor, beta-blocker Cozaar and statin Echo shows reduced systolic function of 30 to 35%  2.  Diastolic congestive heart failure with pulmonary hypertension Volume control by hemodialysis Dialysis today  3.  End-stage renal disease on dialysis Appreciate nephrology follow-up  4.  Acute COPD exacerbation Switch to PO steroids tomorrow bronchodilator treatments Zithromax antibiotic Oxygen via nasal cannula  5.Hyperkalemia Dialysis today  6. DVT prophylaxis with [place] heparin       Code Status Orders  (From admission, onward)        Start     Ordered   02/11/18 2127  Full code  Continuous     02/11/18 2127    Code Status History    Date Active Date Inactive Code Status Order ID Comments User Context   11/28/2017 1726 11/30/2017 2143 Full Code [id number]  Altamese Dilling, MD Inpatient   08/08/2017 1036 08/09/2017 1855 Full Code [id number]  Enedina Finner, MD Inpatient   06/13/2017 1100 06/13/2017 1602 Full Code [id number]  Annice Needy, MD Inpatient      Time Spent in minutes   35  Greater than 50% of time spent in care coordination and counseling patient regarding the condition and plan of care.   Ihor Austin M.D on 02/14/2018 at 3:26 PM  Between 7am to 6pm - Pager - [phone]  After 6pm go to [email or web address] - Social research officer, government  [employer]   Office  [phone]

## 2018-02-14 NOTE — Progress Notes (Signed)
Inpatient Diabetes Program Recommendations  AACE/ADA: New Consensus Statement on Inpatient Glycemic Control (2015)  Target Ranges:  Prepandial:   less than 140 mg/dL      Peak postprandial:   less than 180 mg/dL (1-2 hours)      Critically ill patients:  140 - 180 mg/dL   Results for [name], Keeana (MRN [id number]) as of 02/14/2018 08:36  Ref. Range 02/13/2018 07:43 02/13/2018 14:40 02/13/2018 16:44 02/13/2018 22:14 02/14/2018 07:49  Glucose-Capillary Latest Ref Range: 65 - 99 mg/dL 308375 (H) 657244 (H) 846267 (H) 353 (H) 290 (H)  Results for [name], Anquanette (MRN [id number]) as of 02/14/2018 08:36  Ref. Range 11/29/2017 08:18 02/12/2018 03:38  Hemoglobin A1C Latest Ref Range: 4.8 - 5.6 % 7.2 (H) 8.1 (H)   Review of Glycemic Control  Diabetes history: DM2 Outpatient Diabetes medications: None Current orders for Inpatient glycemic control: Lantus 8 units daily, Novolog 0-9 units ACHS; Solumedrol 60 mg Q6H  Inpatient Diabetes Program Recommendations:  Insulin - Basal: Please consider increasing Lantus to 12 units daily (if steroids will be continued). Correction (SSI): Please consider increasing Novolog to 0-15 units ACHS. Insulin - Meal Coverage: If steroids are continued, please consider ordering Novolog 4 units TID with meals for meal coverage if patient eats at elast 50% of meals. HgbA1C: A1C 8.1% on 02/12/18. Diabetes Coordinator and RD talked with patient on 02/13/18 and patient is not willing to take any DM medications outpatient and not likely to make dietary changes to improve glycemic control.    Thanks, Orlando PennerMarie Trayce Caravello, RN, MSN, CDE Diabetes Coordinator Inpatient Diabetes Program [phone] (Team Pager from 8am to 5pm)

## 2018-02-14 NOTE — Progress Notes (Signed)
HD initiated via L AVF without issue using 15g needles x2. No heparin treatment. 2.5L UF goal. Patient currently denies complaints. All vitals stable. Continue to monitor

## 2018-02-15 ENCOUNTER — Inpatient Hospital Stay: Payer: No Typology Code available for payment source

## 2018-02-15 ENCOUNTER — Encounter: Payer: Self-pay | Admitting: Internal Medicine

## 2018-02-15 DIAGNOSIS — R0602 Shortness of breath: Secondary | ICD-10-CM

## 2018-02-15 DIAGNOSIS — J811 Chronic pulmonary edema: Secondary | ICD-10-CM

## 2018-02-15 LAB — GLUCOSE, CAPILLARY
GLUCOSE-CAPILLARY: 428 mg/dL — AB (ref 65–99)
Glucose-Capillary: 431 mg/dL — ABNORMAL HIGH (ref 65–99)
Glucose-Capillary: 534 mg/dL (ref 65–99)
Glucose-Capillary: 440 mg/dL — ABNORMAL HIGH (ref 65–99)
Glucose-Capillary: 104 mg/dL — ABNORMAL HIGH (ref 65–99)
Glucose-Capillary: 212 mg/dL — ABNORMAL HIGH (ref 65–99)

## 2018-02-15 LAB — MRSA PCR SCREENING: MRSA by PCR: NEGATIVE

## 2018-02-15 MED ORDER — PREDNISONE 20 MG PO TABS
40.0000 mg | ORAL_TABLET | Freq: Every day | ORAL | Status: DC
  Administered 2018-02-15 – 2018-02-17 (×3): 40 mg via ORAL
  Filled 2018-02-15 (×3): qty 2

## 2018-02-15 MED ORDER — INSULIN ASPART 100 UNIT/ML ~~LOC~~ SOLN
10.0000 [IU] | Freq: Three times a day (TID) | SUBCUTANEOUS | Status: DC
  Administered 2018-02-16 – 2018-02-17 (×4): 10 [IU] via SUBCUTANEOUS
  Filled 2018-02-15 (×4): qty 1

## 2018-02-15 MED ORDER — INSULIN ASPART 100 UNIT/ML ~~LOC~~ SOLN
20.0000 [IU] | Freq: Once | SUBCUTANEOUS | Status: AC
  Administered 2018-02-15: 20 [IU] via SUBCUTANEOUS
  Filled 2018-02-15: qty 0.2
  Filled 2018-02-15: qty 1

## 2018-02-15 MED ORDER — ISOSORBIDE MONONITRATE ER 30 MG PO TB24
15.0000 mg | ORAL_TABLET | Freq: Every day | ORAL | Status: DC
  Administered 2018-02-16: 15 mg via ORAL
  Filled 2018-02-15: qty 1

## 2018-02-15 MED ORDER — INSULIN ASPART 100 UNIT/ML ~~LOC~~ SOLN
18.0000 [IU] | Freq: Once | SUBCUTANEOUS | Status: AC
  Administered 2018-02-15: 18 [IU] via SUBCUTANEOUS
  Filled 2018-02-15: qty 1

## 2018-02-15 MED ORDER — INSULIN GLARGINE 100 UNIT/ML ~~LOC~~ SOLN
12.0000 [IU] | Freq: Two times a day (BID) | SUBCUTANEOUS | Status: DC
  Administered 2018-02-16: 12 [IU] via SUBCUTANEOUS
  Filled 2018-02-15 (×3): qty 0.12

## 2018-02-15 NOTE — Progress Notes (Signed)
HD tx end   02/15/18 1720  Vital Signs  Pulse Rate (!) 104  Pulse Rate Source Monitor  Resp 20  BP (!) 141/86  BP Location Left Arm  BP Method Automatic  Patient Position (if appropriate) Lying  Oxygen Therapy  SpO2 100 %  O2 Device Nasal Cannula  O2 Flow Rate (L/min) 2 L/min  During Hemodialysis Assessment  Dialysis Fluid Bolus Normal Saline  Bolus Amount (mL) 250 mL  Intra-Hemodialysis Comments Tx completed

## 2018-02-15 NOTE — Progress Notes (Signed)
Post HD assessment   02/15/18 1732  Neurological  Level of Consciousness Alert  Orientation Level Oriented X4  Respiratory  Respiratory Pattern Regular;Unlabored;Dyspnea with exertion  Chest Assessment Chest expansion symmetrical  Cardiac  ECG Monitor Yes  Antiarrhythmic device  Antiarrhythmic device Permanent Pacemaker  Vascular  R Radial Pulse +2  L Radial Pulse +2  Integumentary  Integumentary (WDL) X  Skin Color Jaundice  Musculoskeletal  Musculoskeletal (WDL) X  Generalized Weakness Yes  Assistive Device None  GU Assessment  Genitourinary (WDL) X  Genitourinary Symptoms  (HD)  Psychosocial  Psychosocial (WDL) WDL

## 2018-02-15 NOTE — Care Management Important Message (Signed)
Copy of signed IM left in patient's room.    

## 2018-02-15 NOTE — Progress Notes (Signed)
Pt.'s CBG at 0752 was 428. Prime doctor notified. New orders to place: d/c IV solumedrol, start prednisone 40 mg once daily PO, insulin 18 units SQ once at this time and give 4 units of meal coverage SQ. Pt is eating well. Pt has no complaints. RN will continue to monitor pt.   Coco Sharpnack [place]

## 2018-02-15 NOTE — Progress Notes (Signed)
MD made aware that pt.'s CBG now is 534, new orders to give 20 units of novolog and meal coverage. Pt has no complaints at this time. RN will continue to monitor pt.   Bhakti Labella [place]

## 2018-02-15 NOTE — Progress Notes (Addendum)
Progress Note  Patient Name: Nichole Hurst Date of Encounter: 02/15/2018  Primary Cardiologist: [facility]  Subjective   Seen in HD. No chest pain.   Inpatient Medications    Scheduled Meds: . aspirin EC  81 mg Oral QHS  . atorvastatin  80 mg Oral QPM  . calcium acetate  1,334 mg Oral TID WC  . Difluprednate  1 drop Left Eye Daily  . docusate sodium  100 mg Oral BID  . feeding supplement (NEPRO CARB STEADY)  237 mL Oral BID BM  . ferrous sulfate  325 mg Oral Q breakfast  . heparin  5,000 Units Subcutaneous Q8H  . insulin aspart  0-15 Units Subcutaneous TID WC  . insulin aspart  0-5 Units Subcutaneous QHS  . insulin aspart  4 Units Subcutaneous TID WC  . insulin glargine  12 Units Subcutaneous Daily  . isosorbide mononitrate  15 mg Oral Daily  . latanoprost  1 drop Both Eyes QHS  . levalbuterol  1.25 mg Nebulization Q6H  . losartan  100 mg Oral QHS  . metoprolol succinate  50 mg Oral BID  . moxifloxacin  1 drop Left Eye QID  . multivitamin  1 tablet Oral QHS  . pantoprazole  40 mg Oral Daily  . predniSONE  40 mg Oral Q breakfast  . sodium chloride flush  3 mL Intravenous Q12H  . ticagrelor  60 mg Oral BID  . timolol  1 drop Left Eye BID  . vitamin C  250 mg Oral BID   Continuous Infusions: . sodium chloride     PRN Meds: sodium chloride, albuterol, ALPRAZolam, alum & mag hydroxide-simeth, chlorpheniramine-HYDROcodone, diphenhydrAMINE, fluticasone, nitroGLYCERIN, ondansetron **OR** ondansetron (ZOFRAN) IV, ondansetron, oxyCODONE, sodium chloride flush, traMADol   Vital Signs    Vitals:   02/14/18 1852 02/14/18 1925 02/14/18 2136 02/15/18 0347  BP: 129/79  (!) 105/56 107/90  Pulse: 80  88 85  Resp:   19 20  Temp:   (!) 97.5 F (36.4 C) 97.6 F (36.4 C)  TempSrc:   Oral Oral  SpO2:  98% 100% 100%  Weight:      Height:        Intake/Output Summary (Last 24 hours) at 02/15/2018 1134 Last data filed at 02/15/2018 0400 Gross per 24 hour  Intake 120 ml  Output  3000 ml  Net -2880 ml   Filed Weights   02/13/18 1320 02/14/18 0858 02/14/18 1445  Weight: 154 lb 1.6 oz (69.9 kg) 154 lb (69.9 kg) 156 lb 8.4 oz (71 kg)    Telemetry    NSR - Personally Reviewed  ECG    n/a - Personally Reviewed  Physical Exam   GEN: No acute distress.   Neck: No JVD. Cardiac: RRR, I/VI systolic murmur, no rubs, or gallops.  Respiratory: Diminished breath sounds bilaterally.  GI: Soft, nontender, non-distended.   MS: Trace pretibial edema; No deformity. Neuro:  Alert and oriented x 3; Nonfocal.  Psych: Normal affect.  Labs    Chemistry Recent Labs  Lab 02/11/18 1659 02/11/18 2136 02/12/18 0338 02/14/18 0631  NA 136  --  135 134*  K 4.6  --  4.9 5.6*  CL 92*  --  92* 97*  CO2 32  --  25 27  GLUCOSE 245*  --  326* 280*  BUN 33*  --  41* 57*  CREATININE 6.58* 6.70* 7.09* 6.17*  CALCIUM 9.7  --  9.3 8.9  GFRNONAA 6* 6* 5* 6*  GFRAA 7* 7*  6* 7*  ANIONGAP 12  --  18* 10     Hematology Recent Labs  Lab 02/12/18 0338 02/13/18 0327 02/14/18 0631  WBC 5.3 11.8* 9.5  RBC 4.47 4.18 3.95  HGB 12.4 11.9* 11.1*  HCT 39.7 36.9 35.4  MCV 88.8 88.3 89.7  MCH 27.8 28.3 28.1  MCHC 31.3* 32.1 31.3*  RDW 20.4* 20.2* 20.0*  PLT 171 186 183    Cardiac Enzymes Recent Labs  Lab 02/12/18 1334 02/12/18 2242 02/13/18 0327 02/13/18 1043  TROPONINI 5.80* 6.02* 6.93* 5.62*   No results for input(s): TROPIPOC in the last 168 hours.   BNPNo results for input(s): BNP, PROBNP in the last 168 hours.   DDimer No results for input(s): DDIMER in the last 168 hours.   Radiology    Dg Chest Port 1 View  Result Date: 02/15/2018 IMPRESSION: No active disease. Electronically Signed   By: Alcide Clever M.D.   On: 02/15/2018 07:46    Cardiac Studies   [facility] 02/14/2018: Conclusion   Conclusions: 1. Severe native coronary disease, including diffusely calcified LAD and dominant LCx with 80-90% lesions. 2. Widely patent LIMA-LAD and SVG-OM.  The SVG-OM graft  does not adequately supply the distal LCx and its PL/PDA branches. 3. Severely elevated left heart, right heart, and pulmonary artery pressures. 4. Normal Fick cardiac output/index.  Recommendations: 1. Medical therapy of multivessel CAD.  PCI to the mid LCx could be considered but would like require orbital atherectomy.  This should only be attempted for refractory symptoms after the patient has been optimized from a heart failure standpoint. 2. Initiate isosorbide mononitrate 15 mg daily; uptitrate as tolerated. 3. Hemodialysis today and tomorrow with removal of as much fluid as tolerated.    TTE 02/13/2018: Study Conclusions  - Left ventricle: The cavity size was normal. There was moderate   concentric hypertrophy. Systolic function was moderately to   severely reduced. The estimated ejection fraction was in the   range of 30% to 35%. The study is not technically sufficient to   allow evaluation of LV diastolic function. - Mitral valve: Severely calcified annulus. Mildly thickened,   moderately calcified leaflets . There was moderate regurgitation. - Left atrium: The atrium was mildly to moderately dilated. - Tricuspid valve: There was moderate regurgitation. - Pulmonary arteries: Systolic pressure was moderately increased.   PA peak pressure: 50 mm Hg (S).  Impressions:  - Since last echo, EF dropped .  Patient Profile     67 y.o. female with history of coronary artery disease status post two-vessel CABG in July 2017(LIMA to LAD and SVG to OM1)in 2017, tachybradycardia syndrome status post pacemaker placement in 2017, end-stage renal disease on hemodialysis, chronic diastolic heart failure with pulmonary hypertension, peripheral arterial disease, anemia of chronic disease, diabetes mellitus, hypertension and hyperlipidemia who presented with atypical chest pain and shortness of breath and was found to have a non-ST elevation myocardial infarction.  Assessment & Plan      NSTEMI No significant chest pain reported today, though [name] still has intermittent shortness of breath and chest tightness.  Catheterization showed patent bypass grafts in the setting of severe native CAD.  It was suspected that her NSTEMI was the result of supply-demand mismatch in the setting of severe native CAD.  The mid/distal LCx and its PL/PDA branches are not revascularized by CABG.  Continue aggressive medical therapy including dual antiplatelet therapy with aspirin and low-dose ticagrelor.  Optimize heart failure regimen.  Aggressive secondary prevention including lipid  and glucose control.  Continue isosorbide mononitrate 15 mg daily, to be uptitrated as blood pressure allows.  If Ms. Dewaine CongerBarker has refractory symptoms despite optimal medical therapy and maintenance of a euvolemic state, PCI to the LCx would need to be considered.  Given diffuse calcification, this would likely need to involve atherectomy.  Acute systolic heart failure Left and right heart catheterization 4/9 showed severely elevated left heart, right heart, and pulmonary artery pressures.  It was suspected this is the driving force behind Ms. Dewaine CongerBarker his symptoms and demand ischemia.  Escalate fluid removal with hemodialysis.  She will likely need several liters removed before she is ready for discharge.  Continue metoprolol succinate to 50 mg twice daily.  Continue losartan 100 mg daily.  Hypertension Blood pressure improved today.  Hypotension has limited HD in the past.   Medications as above.  Hyperlipidemia:  Continue atorvastatin.  End-stage renal disease  As above, fluid removal needs to be escalated.  COPD No significant wheezing on exam today.  Recommend rapid de-escalation/discontinuation of systemic steroids.    For questions or updates, please contact [facility] Please consult [email or web address] for contact info under Cardiology/STEMI.    Signed, Eula Listenyan Dunn, PA-C [facility] Pager: [phone] 02/15/2018, 11:34 AM   Attending Note Patient seen and examined, agree with detailed note above,  Patient presentation and plan discussed on rounds.   She reports having some mild chest discomfort today Mild shortness of breath Had dialysis yesterday Plan for dialysis later today Reports she is unable to ambulate well after toe amputation, high fall risk  Cardiac catheterization results discussed with her in detail She reports significant anxiety, requesting medication  On physical exam appears anxious, almost shaking Lungs clear to auscultation, unable to estimate JVD, heart sounds regular normal S1-S2 no murmurs appreciated.  Abdomen soft nontender no significant lower extremity edema   Lab work reviewed showing troponin trending downward Potassium elevated 5.6 creatinine 6.1 hematocrit 35  A/P: NSTEMI Cath with patent grafts, The mid/distal LCx and its PL/PDA branches are not revascularized by CABG. -- dual antiplatelet therapy with aspirin and low-dose ticagrelor. Nitro sl for severe chest pain Could consider intervention to circumflex if anginal symptoms persist  Acute systolic heart failure Needs additional hemodialysis Elevated pressures on echo and right heart cath  Hypertension Elevated today during dialysis  if this stays high may need to increase her isosorbide  Hyperlipidemia: Continue atorvastatin.  End-stage renal disease Hemodialysis today  COPD No significant wheezing on exam today.  Anxiety Major issue today She may benefit from SSRI,  Xanax as needed  Greater than 50% was spent in counseling and coordination of care with patient Total encounter time 35 minutes or more   Signed: Dossie Arbourim Santiago Graf  M.D., Ph.D. [facility]

## 2018-02-15 NOTE — Progress Notes (Signed)
Inpatient Diabetes Program Recommendations  AACE/ADA: New Consensus Statement on Inpatient Glycemic Control (2015)  Target Ranges:  Prepandial:   less than 140 mg/dL      Peak postprandial:   less than 180 mg/dL (1-2 hours)      Critically ill patients:  140 - 180 mg/dL  Results for Nichole EvensBARKER, Nichole Hurst (MRN [id number]) as of 02/15/2018 07:53  Ref. Range 02/14/2018 07:49 02/14/2018 19:05 02/14/2018 21:36 02/15/2018 04:45 02/15/2018 07:52  Glucose-Capillary Latest Ref Range: 65 - 99 mg/dL 409290 (H) 811271 (H) 914476 (H) 431 (H) 428 (H)    Review of Glycemic Control  Diabetes history:DM2 Outpatient Diabetes medications:None Current orders for Inpatient glycemic control:Lantus 12 units daily, Novolog 0-15 units TID, Novolog 0-5 units QHS, Novolog 4 units TID with meals for meal coverage; Solumedrol 60 mg Q6H  Inpatient Diabetes Program Recommendations:  Insulin - Basal: In reviewing chart, noted patient did NOT receive any basal insulin on 02/14/18. Please consider increasing Lantus to 14 units and start now. Insulin - Meal Coverage: Noted Novolog meal coverage was ordered on 02/14/18 to start this morning with breakfast. HgbA1C:A1C 8.1% on 02/12/18. Diabetes Coordinator and RD talked with patient on 02/13/18 and patient is not willing to take any DM medications outpatient and not likely to make dietary changes to improve glycemic control.   Thanks, Orlando PennerMarie Mayleigh Tetrault, RN, MSN, CDE Diabetes Coordinator Inpatient Diabetes Program [phone] (Team Pager from 8am to 5pm)

## 2018-02-15 NOTE — Progress Notes (Signed)
Post HD assessment. Pt tolerated tx well without c/o or complications. Net UF 1712, goal not met. Tx shortened per MD orders.    02/15/18 1730  Vital Signs  Temp 97.6 F (36.4 C)  Temp Source Oral  Pulse Rate 87  Pulse Rate Source Monitor  Resp 14  BP (!) 134/98  BP Location Left Arm  BP Method Automatic  Patient Position (if appropriate) Lying  Oxygen Therapy  SpO2 100 %  O2 Device Nasal Cannula  O2 Flow Rate (L/min) 2 L/min  Dialysis Weight  Weight 69.2 kg (152 lb 8.9 oz)  Type of Weight Post-Dialysis  Post-Hemodialysis Assessment  Rinseback Volume (mL) 250 mL  KECN 34.8 V  Dialyzer Clearance Lightly streaked  Duration of HD Treatment -hour(s) 2 hour(s)  Hemodialysis Intake (mL) 500 mL  UF Total -Machine (mL) 2212 mL  Net UF (mL) 1712 mL  Tolerated HD Treatment Yes  AVG/AVF Arterial Site Held (minutes) 10 minutes  AVG/AVF Venous Site Held (minutes) 10 minutes  Education / Care Plan  Dialysis Education Provided Yes  Documented Education in Care Plan Yes  Fistula / Graft Left Upper arm  Placement Date/Time: 11/29/17 0900   Placed prior to admission: Yes  Orientation: Left  Access Location: Upper arm  Site Condition No complications  Fistula / Graft Assessment Present;Thrill;Bruit  Status Deaccessed  Drainage Description None

## 2018-02-15 NOTE — Progress Notes (Signed)
[facility]  ROUNDING NOTE   Subjective:   [name] admitted to [facility] on 02/11/2018 for Atypical chest pain [R07.89] Chest pain [R07.9] Chronic obstructive pulmonary disease, unspecified COPD type (HCC) [J44.9]    HEMODIALYSIS FLOWSHEET:  Blood Flow Rate (mL/min): 300 mL/min Arterial Pressure (mmHg): -110 mmHg Venous Pressure (mmHg): 140 mmHg Transmembrane Pressure (mmHg): 80 mmHg Ultrafiltration Rate (mL/min): 1120 mL/min Dialysate Flow Rate (mL/min): 600 ml/min Conductivity: Machine : 15.4 Conductivity: Machine : 15.4 Dialysis Fluid Bolus: Normal Saline Bolus Amount (mL): 250 mL Dialysate Change: 2K  Seen during HD Tolerating well  Objective:  Vital signs in last 24 hours:  Temp:  [97.5 F (36.4 C)-98.3 F (36.8 C)] 97.8 F (36.6 C) (04/10 1211) Pulse Rate:  [78-97] 97 (04/10 1645) Resp:  [13-20] 14 (04/10 1645) BP: (105-181)/(56-100) 181/83 (04/10 1645) SpO2:  [95 %-100 %] 99 % (04/10 1645)  Weight change: -8.7 oz (-0.246 kg) Filed Weights   02/13/18 1320 02/14/18 0858 02/14/18 1445  Weight: 154 lb 1.6 oz (69.9 kg) 154 lb (69.9 kg) 156 lb 8.4 oz (71 kg)    Intake/Output: I/O last 3 completed shifts: In: 431.4 [P.O.:120; I.V.:311.4] Out: 3000 [Other:3000]   Intake/Output this shift:  Total I/O In: 240 [P.O.:240] Out: -   Physical Exam: General: NAD, sitting   Head: Normocephalic, atraumatic. Moist oral mucosal membranes  Eyes: Anicteric,    Neck: Supple,    Lungs:  Clear today  Heart: Regular rate and rhythm  Abdomen:  Soft, nontender,   Extremities: Trace peripheral edema.    Neurologic: Nonfocal, moving all four extremities  Skin: Healing left first toe ulcer  Access: Left upper arm AVF    Basic Metabolic Panel: Recent Labs  Lab 02/11/18 1659 02/11/18 2136 02/12/18 0338 02/14/18 0631  NA 136  --  135 134*  K 4.6  --  4.9 5.6*  CL 92*  --  92* 97*  CO2 32  --  25 27  GLUCOSE 245*  --  326* 280*  BUN 33*  --  41*  57*  CREATININE 6.58* 6.70* 7.09* 6.17*  CALCIUM 9.7  --  9.3 8.9    Liver Function Tests: No results for input(s): AST, ALT, ALKPHOS, BILITOT, PROT, ALBUMIN in the last 168 hours. No results for input(s): LIPASE, AMYLASE in the last 168 hours. No results for input(s): AMMONIA in the last 168 hours.  CBC: Recent Labs  Lab 02/11/18 1659 02/11/18 2136 02/12/18 0338 02/13/18 0327 02/14/18 0631  WBC 6.2 5.8 5.3 11.8* 9.5  HGB 12.8 12.7 12.4 11.9* 11.1*  HCT 40.2 39.1 39.7 36.9 35.4  MCV 88.2 88.3 88.8 88.3 89.7  PLT 198 179 171 186 183    Cardiac Enzymes: Recent Labs  Lab 02/12/18 0907 02/12/18 1334 02/12/18 2242 02/13/18 0327 02/13/18 1043  TROPONINI 3.09* 5.80* 6.02* 6.93* 5.62*    BNP: Invalid input(s): POCBNP  CBG: Recent Labs  Lab 02/14/18 2136 02/15/18 0445 02/15/18 0752 02/15/18 1133 02/15/18 1314  GLUCAP 476* 431* 428* 534* 440*    Microbiology: Results for orders placed or performed during the hospital encounter of 02/11/18  MRSA PCR Screening     Status: None   Collection Time: 02/15/18 11:14 AM  Result Value Ref Range Status   MRSA by PCR NEGATIVE NEGATIVE Final    Comment:        The GeneXpert MRSA Assay (FDA approved for NASAL specimens only), is one component of a comprehensive MRSA colonization surveillance program. It is not intended to  diagnose MRSA infection nor to guide or monitor treatment for MRSA infections. Performed at [facility], [address] [place], [place] [address]     Coagulation Studies: Recent Labs    02/14/18 0631  LABPROT 14.8  INR 1.17    Urinalysis: No results for input(s): COLORURINE, LABSPEC, PHURINE, GLUCOSEU, HGBUR, BILIRUBINUR, KETONESUR, PROTEINUR, UROBILINOGEN, NITRITE, LEUKOCYTESUR in the last 72 hours.  Invalid input(s): APPERANCEUR    Imaging: Dg Chest Port 1 View  Result Date: 02/15/2018 CLINICAL DATA:  Shortness of Breath EXAM: PORTABLE CHEST 1 VIEW COMPARISON:  02/11/2018  FINDINGS: Cardiac shadow is mildly enlarged. Postsurgical changes are seen. Pacemaker is noted in satisfactory position. The lungs are well aerated bilaterally. No bony abnormality is seen. IMPRESSION: No active disease. Electronically Signed   By: Alcide CleverMark  Lukens M.D.   On: 02/15/2018 07:46     Medications:   . sodium chloride     . aspirin EC  81 mg Oral QHS  . atorvastatin  80 mg Oral QPM  . calcium acetate  1,334 mg Oral TID WC  . Difluprednate  1 drop Left Eye Daily  . docusate sodium  100 mg Oral BID  . feeding supplement (NEPRO CARB STEADY)  237 mL Oral BID BM  . ferrous sulfate  325 mg Oral Q breakfast  . heparin  5,000 Units Subcutaneous Q8H  . insulin aspart  0-15 Units Subcutaneous TID WC  . insulin aspart  0-5 Units Subcutaneous QHS  . insulin aspart  10 Units Subcutaneous TID WC  . insulin glargine  12 Units Subcutaneous BID  . isosorbide mononitrate  15 mg Oral Daily  . latanoprost  1 drop Both Eyes QHS  . levalbuterol  1.25 mg Nebulization Q6H  . losartan  100 mg Oral QHS  . metoprolol succinate  50 mg Oral BID  . moxifloxacin  1 drop Left Eye QID  . multivitamin  1 tablet Oral QHS  . pantoprazole  40 mg Oral Daily  . predniSONE  40 mg Oral Q breakfast  . sodium chloride flush  3 mL Intravenous Q12H  . ticagrelor  60 mg Oral BID  . timolol  1 drop Left Eye BID  . vitamin C  250 mg Oral BID   sodium chloride, albuterol, ALPRAZolam, alum & mag hydroxide-simeth, chlorpheniramine-HYDROcodone, diphenhydrAMINE, fluticasone, nitroGLYCERIN, ondansetron **OR** ondansetron (ZOFRAN) IV, ondansetron, oxyCODONE, sodium chloride flush, traMADol  Assessment/ Plan:  [name] is a 67 y.o. white female with end stage renal disease on hemodialysis, coronary artery disease, hypertension, congestive heart failure, CVA, pulmonary hypertension, peripheral arterial disease, hyperlipidemia, GERD, glaucoma, COPD, left first toe amputation.   M[facility]    1. End Stage Renal Disease with Acute Pulmonary edema: on hemodialysis MWF.  -   3 L of fluid was removed with hemodialysis on Tuesday.  Another 2-2.5 L scheduled to be removed with dialysis today Overall clinically feels better. Next dialysis to be planned for Friday if still in the hospital  2.  NSTEMI - cardiology team is following - Echo : LVEF 30-35%, mod Pulm HTN, mod MR - Cardiac cath 02/14/18- Multivessel CAD.  - optimize medical therapy and volume status - Isosorbide status  3. Anemia of chronic kidney disease: hemoglobin within normal limits.  - Continue Mircera as outpatient - Hgb 11.1 (at goal)  4. Secondary Hyperparathyroidism:  - Calcium acetate with meals.      LOS: 4 Gregery Walberg 4/10/20195:03 PM

## 2018-02-15 NOTE — Progress Notes (Signed)
[employer] - [place] at [facility]                                                                                                                                                                                  Patient Demographics   Nichole EvensGloria Hurst, is a 67 y.o. female, DOB - 11/29/1950, ZOX:[id number]  Admit date - 02/11/2018   Admitting Physician Ramonita LabAruna Gouru, MD  [facility]patient [facility]   LOS - 4  Subjective: Patient seen and evaluated today  Feels better Decreased shortness of breath Getting dialysis today Is elevated blood sugars today   Review of Systems:   CONSTITUTIONAL: No documented fever. No fatigue, weakness. No weight gain, no weight loss.  EYES: No blurry or double vision.  ENT: No tinnitus. No postnasal drip. No redness of the oropharynx.  RESPIRATORY: Has decreased cough, has decreased wheezing, no hemoptysis. No dyspnea.  CARDIOVASCULAR: Has chest tightness. No orthopnea. No palpitations. No syncope.  GASTROINTESTINAL: No nausea, no vomiting or diarrhea. No abdominal pain. No melena or hematochezia.  GENITOURINARY: No dysuria or hematuria.  ENDOCRINE: No polyuria or nocturia. No heat or cold intolerance.  HEMATOLOGY: No anemia. No bruising. No bleeding.  INTEGUMENTARY: No rashes. No lesions.  MUSCULOSKELETAL: No arthritis. No swelling. No gout.  NEUROLOGIC: No numbness, tingling, or ataxia. No seizure-type activity.  PSYCHIATRIC: No anxiety. No insomnia. No ADD.    Vitals:   Vitals:   02/14/18 1925 02/14/18 2136 02/15/18 0347 02/15/18 1211  BP:  (!) 105/56 107/90 (!) 152/84  Pulse:  88 85 95  Resp:  19 20 14   Temp:  (!) 97.5 F (36.4 C) 97.6 F (36.4 C) 97.8 F (36.6 C)  TempSrc:  Oral Oral Oral  SpO2: 98% 100% 100% 100%  Weight:      Height:        Wt Readings from Last 3 Encounters:  02/14/18 71 kg (156 lb 8.4 oz)  01/31/18 66.7 kg (147 lb)  11/30/17 70.3 kg (154 lb 15.7 oz)      Intake/Output Summary (Last 24 hours) at 02/15/2018 1403 Last data filed at 02/15/2018 0400 Gross per 24 hour  Intake 120 ml  Output 3000 ml  Net -2880 ml    Physical Exam:   GENERAL: Pleasant-appearing female patient in no apparent distress on oxygen via nasal canula HEAD, EYES, EARS, NOSE AND THROAT: Atraumatic, normocephalic. Extraocular muscles are intact. Pupils equal and reactive to light. Sclerae anicteric. No conjunctival injection. No oro-pharyngeal erythema.  NECK: Supple. There is no jugular venous distention. No bruits, no lymphadenopathy, no thyromegaly.  HEART: Regular rate and rhythm,. No murmurs, no rubs, no  clicks.  LUNGS:Improved airflow in both lungs, bilateral wheezing decreased ABDOMEN: Soft, flat, nontender, nondistended. Has good bowel sounds. No hepatosplenomegaly appreciated.  EXTREMITIES: No evidence of any cyanosis, clubbing, or peripheral edema.  +2 pedal and radial pulses bilaterally.  NEUROLOGIC: The patient is alert, awake, and oriented x3 with no focal motor or sensory deficits appreciated bilaterally.  SKIN: Moist and warm with no rashes appreciated.  Psych: Not anxious, depressed LN: No inguinal LN enlargement    Antibiotics   Anti-infectives (From admission, onward)   Start     Dose/Rate Route Frequency Ordered Stop   02/11/18 2130  azithromycin (ZITHROMAX) tablet 500 mg     500 mg Oral Daily 02/11/18 2127 02/13/18 1513      Medications   Scheduled Meds: . aspirin EC  81 mg Oral QHS  . atorvastatin  80 mg Oral QPM  . calcium acetate  1,334 mg Oral TID WC  . Difluprednate  1 drop Left Eye Daily  . docusate sodium  100 mg Oral BID  . feeding supplement (NEPRO CARB STEADY)  237 mL Oral BID BM  . ferrous sulfate  325 mg Oral Q breakfast  . heparin  5,000 Units Subcutaneous Q8H  . insulin aspart  0-15 Units Subcutaneous TID WC  . insulin aspart  0-5 Units Subcutaneous QHS  . insulin aspart  10 Units Subcutaneous TID WC  . insulin  glargine  12 Units Subcutaneous BID  . isosorbide mononitrate  15 mg Oral Daily  . latanoprost  1 drop Both Eyes QHS  . levalbuterol  1.25 mg Nebulization Q6H  . losartan  100 mg Oral QHS  . metoprolol succinate  50 mg Oral BID  . moxifloxacin  1 drop Left Eye QID  . multivitamin  1 tablet Oral QHS  . pantoprazole  40 mg Oral Daily  . predniSONE  40 mg Oral Q breakfast  . sodium chloride flush  3 mL Intravenous Q12H  . ticagrelor  60 mg Oral BID  . timolol  1 drop Left Eye BID  . vitamin C  250 mg Oral BID   Continuous Infusions: . sodium chloride     PRN Meds:.sodium chloride, albuterol, ALPRAZolam, alum & mag hydroxide-simeth, chlorpheniramine-HYDROcodone, diphenhydrAMINE, fluticasone, nitroGLYCERIN, ondansetron **OR** ondansetron (ZOFRAN) IV, ondansetron, oxyCODONE, sodium chloride flush, traMADol   Data Review:   Micro Results Recent Results (from the past 240 hour(s))  MRSA PCR Screening     Status: None   Collection Time: 02/15/18 11:14 AM  Result Value Ref Range Status   MRSA by PCR NEGATIVE NEGATIVE Final    Comment:        The GeneXpert MRSA Assay (FDA approved for NASAL specimens only), is one component of a comprehensive MRSA colonization surveillance program. It is not intended to diagnose MRSA infection nor to guide or monitor treatment for MRSA infections. Performed at [facility], [address], [place], [place] [address]     Radiology Reports Ct Angio Chest Pe W And/or Wo Contrast  Result Date: 02/11/2018 CLINICAL DATA:  67 year old female presents with left-sided chest pain radiating to the back x2 days worse with deep inspiration. Reports cough. EXAM: CT ANGIOGRAPHY CHEST WITH CONTRAST TECHNIQUE: Multidetector CT imaging of the chest was performed using the standard protocol during bolus administration of intravenous contrast. Multiplanar CT image reconstructions and MIPs were obtained to evaluate the vascular anatomy. CONTRAST:  75mL  OMNIPAQUE IOHEXOL 350 MG/ML SOLN COMPARISON:  02/11/2018 CXR FINDINGS: Cardiovascular: Heart is top normal with aortic atherosclerosis.  No aneurysm. Right-sided pacemaker apparatus with right atrial and right ventricular leads are noted. The patient is status post CABG. Moderate atherosclerosis of the great vessels with normal branch pattern off the thoracic aorta. No aortic aneurysm. No apparent dissection though study is tailored for the assessment of the pulmonary arterial system. No acute pulmonary embolus to the segmental branches. Dilatation of the main pulmonary artery to 3.5 cm consistent with changes of chronic pulmonary hypertension. Mediastinum/Nodes: Mild left lower paratracheal lymphadenopathy up to 19 mm short axis with smaller right lower paratracheal lymph nodes measuring up to 8 mm short axis. Patent trachea and mainstem bronchi. Esophagus is unremarkable. No thyromegaly or mass. Lungs/Pleura: No acute pulmonary consolidation, effusion or pneumothorax. No dominant mass. Upper Abdomen: No acute abnormality Musculoskeletal: Schmorl's nodes involving several upper thoracic vertebrae. No acute nor suspicious osseous abnormality. Review of the MIP images confirms the above findings. IMPRESSION: 1. Moderate thoracic aortic and branch vessel atherosclerosis. 2. Status post CABG with native coronary arteriosclerosis. 3. No active pulmonary disease.  No acute pulmonary embolus. 4. Mild mediastinal adenopathy, nonspecific possibly reactive in etiology. Aortic Atherosclerosis (ICD10-I70.0). Electronically Signed   By: Tollie Eth M.D.   On: 02/11/2018 19:06   Dg Chest Port 1 View  Result Date: 02/15/2018 CLINICAL DATA:  Shortness of Breath EXAM: PORTABLE CHEST 1 VIEW COMPARISON:  02/11/2018 FINDINGS: Cardiac shadow is mildly enlarged. Postsurgical changes are seen. Pacemaker is noted in satisfactory position. The lungs are well aerated bilaterally. No bony abnormality is seen. IMPRESSION: No active  disease. Electronically Signed   By: Alcide Clever M.D.   On: 02/15/2018 07:46   Dg Chest Port 1 View  Result Date: 02/11/2018 CLINICAL DATA:  Left-sided chest pain.  Shortness of breath. EXAM: PORTABLE CHEST 1 VIEW COMPARISON:  August 08, 2017 FINDINGS: Stable pacemaker. The heart size borderline. The hila and mediastinum are normal. No pulmonary nodules or masses. No focal infiltrates. IMPRESSION: No active disease. Electronically Signed   By: Gerome Sam III M.D   On: 02/11/2018 17:34     CBC Recent Labs  Lab 02/11/18 1659 02/11/18 2136 02/12/18 0338 02/13/18 0327 02/14/18 0631  WBC 6.2 5.8 5.3 11.8* 9.5  HGB 12.8 12.7 12.4 11.9* 11.1*  HCT 40.2 39.1 39.7 36.9 35.4  PLT 198 179 171 186 183  MCV 88.2 88.3 88.8 88.3 89.7  MCH 27.9 28.6 27.8 28.3 28.1  MCHC 31.7* 32.4 31.3* 32.1 31.3*  RDW 19.8* 20.0* 20.4* 20.2* 20.0*    Chemistries  Recent Labs  Lab 02/11/18 1659 02/11/18 2136 02/12/18 0338 02/14/18 0631  NA 136  --  135 134*  K 4.6  --  4.9 5.6*  CL 92*  --  92* 97*  CO2 32  --  25 27  GLUCOSE 245*  --  326* 280*  BUN 33*  --  41* 57*  CREATININE 6.58* 6.70* 7.09* 6.17*  CALCIUM 9.7  --  9.3 8.9   ------------------------------------------------------------------------------------------------------------------ estimated creatinine clearance is 8.1 mL/min (A) (by C-G formula based on SCr of 6.17 mg/dL (H)). ------------------------------------------------------------------------------------------------------------------ No results for input(s): HGBA1C in the last 72 hours. ------------------------------------------------------------------------------------------------------------------ No results for input(s): CHOL, HDL, LDLCALC, TRIG, CHOLHDL, LDLDIRECT in the last 72 hours. ------------------------------------------------------------------------------------------------------------------ No results for input(s): TSH, T4TOTAL, T3FREE, THYROIDAB in the last 72  hours.  Invalid input(s): FREET3 ------------------------------------------------------------------------------------------------------------------ No results for input(s): VITAMINB12, FOLATE, FERRITIN, TIBC, IRON, RETICCTPCT in the last 72 hours.  Coagulation profile Recent Labs  Lab 02/12/18 1334 02/14/18 0631  INR 1.08 1.17  No results for input(s): DDIMER in the last 72 hours.  Cardiac Enzymes Recent Labs  Lab 02/12/18 2242 02/13/18 0327 02/13/18 1043  TROPONINI 6.02* 6.93* 5.62*   ------------------------------------------------------------------------------------------------------------------ Invalid input(s): POCBNP    Assessment & Plan   67 year old female patient with history of end-stage renal disease on dialysis, COPD, hyperlipidemia, non-insulin-dependent diabetes mellitus, coronary artery disease, CABG currently under hospitalist service for COPD exacerbation and elevated troponin and chest tightness.  1.  Non-STEMI Status post cardiac catheterization 1. Severe native coronary disease, including diffusely calcified LAD and dominant LCx with 80-90% lesions. 2. Widely patent LIMA-LAD and SVG-OM. The SVG-OM graft does not adequately supply the distal LCx and its PL/PDA branches. 3. Severely elevated left heart, right heart, and pulmonary artery pressures. 4. Normal Fick cardiac output/index.  Medical therapy for multivessel coronary artery disease Continue aspirin, Brilinta, Lipitor, beta-blocker Cozaar and statin Echo shows reduced systolic function of 30 to 35%  2.  Systolic congestive heart failure with pulmonary hypertension Volume control by hemodialysis Dialysis today  3.  End-stage renal disease on dialysis Appreciate nephrology follow-up  4.  Acute COPD exacerbation Switch to PO steroids tomorrow bronchodilator treatments Zithromax antibiotic Oxygen via nasal cannula  5.Hyperkalemia Dialysis today  6.  Uncontrolled diabetes  mellitus Lantus insulin 12 units [place] BID Increas meal time insulin to 10 units tid  7. DVT prophylaxis with [place] heparin  8. Disposition  Depends only after excess fluid has been removed by hemodialysis.       Code Status Orders  (From admission, onward)        Start     Ordered   02/11/18 2127  Full code  Continuous     02/11/18 2127    Code Status History    Date Active Date Inactive Code Status Order ID Comments User Context   11/28/2017 1726 11/30/2017 2143 Full Code [id number]  Altamese Dilling, MD Inpatient   08/08/2017 1036 08/09/2017 1855 Full Code [id number]  Enedina Finner, MD Inpatient   06/13/2017 1100 06/13/2017 1602 Full Code [id number]  Annice Needy, MD Inpatient      Time Spent in minutes   35  Greater than 50% of time spent in care coordination and counseling patient regarding the condition and plan of care.   Ihor Austin M.D on 02/15/2018 at 2:03 PM  Between 7am to 6pm - Pager - [phone]  After 6pm go to [email or web address] - Social research officer, government  [employer]   Office  [phone]

## 2018-02-16 DIAGNOSIS — J449 Chronic obstructive pulmonary disease, unspecified: Secondary | ICD-10-CM

## 2018-02-16 DIAGNOSIS — R079 Chest pain, unspecified: Secondary | ICD-10-CM

## 2018-02-16 DIAGNOSIS — Z992 Dependence on renal dialysis: Secondary | ICD-10-CM

## 2018-02-16 LAB — GLUCOSE, CAPILLARY
GLUCOSE-CAPILLARY: 248 mg/dL — AB (ref 65–99)
Glucose-Capillary: 258 mg/dL — ABNORMAL HIGH (ref 65–99)
Glucose-Capillary: 192 mg/dL — ABNORMAL HIGH (ref 65–99)
Glucose-Capillary: 123 mg/dL — ABNORMAL HIGH (ref 65–99)

## 2018-02-16 MED ORDER — TORSEMIDE 100 MG PO TABS
100.0000 mg | ORAL_TABLET | Freq: Every day | ORAL | Status: DC
  Administered 2018-02-16: 100 mg via ORAL
  Filled 2018-02-16 (×2): qty 1

## 2018-02-16 MED ORDER — INSULIN GLARGINE 100 UNIT/ML ~~LOC~~ SOLN
14.0000 [IU] | Freq: Every day | SUBCUTANEOUS | Status: DC
  Filled 2018-02-16 (×2): qty 0.14

## 2018-02-16 NOTE — Progress Notes (Signed)
Inpatient Diabetes Program Recommendations  AACE/ADA: New Consensus Statement on Inpatient Glycemic Control (2015)  Target Ranges:  Prepandial:   less than 140 mg/dL      Peak postprandial:   less than 180 mg/dL (1-2 hours)      Critically ill patients:  140 - 180 mg/dL   Results for Nichole EvensBARKER, [name] (MRN [id number]) as of 02/16/2018 08:23  Ref. Range 02/15/2018 07:52 02/15/2018 11:33 02/15/2018 13:14 02/15/2018 18:12 02/15/2018 21:40 02/16/2018 07:30  Glucose-Capillary Latest Ref Range: 65 - 99 mg/dL 782428 (H)  Novolog 22 units  Lantus 12 units 534 (HH)  Novolog 24 units 440 (H)   104 (H) 212 (H)  Novolog 2 units 248 (H)   Review of Glycemic Control  Diabetes history:DM2 Outpatient Diabetes medications:None Current orders for Inpatient glycemic control:Lantus 12 units BID, Novolog 0-15 units TID, Novolog 0-5 units QHS, Novolog 10 units TID with meals for meal coverage; Solumedrol 60 mg Q6H  Inpatient Diabetes Program Recommendations:  Insulin - Basal: In reviewing chart, noted patient did NOT receive evening dose of Lantus last night and fasting glucose 248 mg/dl. Also noted steroids have been significantly decreased.  Please consider changing Lantus to 14 units daily and start now. Insulin - Meal Coverage: Since steroids have been decreased significantly, MD may want to consider decreasing meal coverage insulin.  HgbA1C:A1C 8.1% on 02/12/18.Diabetes Coordinator and RD talked with patient on 02/13/18 and patient is not willing to take any DM medications outpatient and not likely to make dietary changes to improve glycemic control.   Thanks, Orlando PennerMarie Antoinne Spadaccini, RN, MSN, CDE Diabetes Coordinator Inpatient Diabetes Program [phone] (Team Pager from 8am to 5pm)

## 2018-02-16 NOTE — Progress Notes (Signed)
Albuterol Neb administered by [employer]

## 2018-02-16 NOTE — Progress Notes (Signed)
Progress Note  Patient Name: Nichole Hurst Date of Encounter: 02/16/2018  Primary Cardiologist: [facility]  Subjective   Had back pain overnight Does not ambulate Dialysis yesterday for 2 hours Feels that her breathing is improved, chest tightness improved  Inpatient Medications    Scheduled Meds: . aspirin EC  81 mg Oral QHS  . atorvastatin  80 mg Oral QPM  . calcium acetate  1,334 mg Oral TID WC  . Difluprednate  1 drop Left Eye Daily  . docusate sodium  100 mg Oral BID  . feeding supplement (NEPRO CARB STEADY)  237 mL Oral BID BM  . ferrous sulfate  325 mg Oral Q breakfast  . heparin  5,000 Units Subcutaneous Q8H  . insulin aspart  0-15 Units Subcutaneous TID WC  . insulin aspart  0-5 Units Subcutaneous QHS  . insulin aspart  10 Units Subcutaneous TID WC  . insulin glargine  12 Units Subcutaneous BID  . isosorbide mononitrate  15 mg Oral Daily  . latanoprost  1 drop Both Eyes QHS  . levalbuterol  1.25 mg Nebulization Q6H  . losartan  100 mg Oral QHS  . metoprolol succinate  50 mg Oral BID  . moxifloxacin  1 drop Left Eye QID  . multivitamin  1 tablet Oral QHS  . pantoprazole  40 mg Oral Daily  . predniSONE  40 mg Oral Q breakfast  . sodium chloride flush  3 mL Intravenous Q12H  . ticagrelor  60 mg Oral BID  . timolol  1 drop Left Eye BID  . torsemide  100 mg Oral Daily  . vitamin C  250 mg Oral BID   Continuous Infusions: . sodium chloride     PRN Meds: sodium chloride, albuterol, ALPRAZolam, alum & mag hydroxide-simeth, chlorpheniramine-HYDROcodone, diphenhydrAMINE, fluticasone, nitroGLYCERIN, ondansetron **OR** ondansetron (ZOFRAN) IV, ondansetron, oxyCODONE, sodium chloride flush, traMADol   Vital Signs    Vitals:   02/15/18 2140 02/16/18 0450 02/16/18 0743 02/16/18 1129  BP:  104/70  104/65  Pulse:  96  (!) 101  Resp:  18  17  Temp:  97.7 F (36.5 C)    TempSrc:  Oral    SpO2: 96% 99% 96% 100%  Weight:      Height:        Intake/Output Summary  (Last 24 hours) at 02/16/2018 1211 Last data filed at 02/16/2018 1113 Gross per 24 hour  Intake 598 ml  Output 1812 ml  Net -1214 ml   Filed Weights   02/14/18 1445 02/15/18 1510 02/15/18 1730  Weight: 156 lb 8.4 oz (71 kg) 156 lb 1.4 oz (70.8 kg) 152 lb 8.9 oz (69.2 kg)    Telemetry    NSR - Personally Reviewed  ECG    n/a - Personally Reviewed  Physical Exam   No significant change in exam GEN: No acute distress.   Neck: No JVD. Cardiac: RRR, I/VI systolic murmur, no rubs, or gallops.  Respiratory: Diminished breath sounds bilaterally.  GI: Soft, nontender, non-distended.   MS: Trace pretibial edema; No deformity. Neuro:  Alert and oriented x 3; Nonfocal.  Psych: Normal affect.  Labs    Chemistry Recent Labs  Lab 02/11/18 1659 02/11/18 2136 02/12/18 0338 02/14/18 0631  NA 136  --  135 134*  K 4.6  --  4.9 5.6*  CL 92*  --  92* 97*  CO2 32  --  25 27  GLUCOSE 245*  --  326* 280*  BUN 33*  --  41*  57*  CREATININE 6.58* 6.70* 7.09* 6.17*  CALCIUM 9.7  --  9.3 8.9  GFRNONAA 6* 6* 5* 6*  GFRAA 7* 7* 6* 7*  ANIONGAP 12  --  18* 10     Hematology Recent Labs  Lab 02/12/18 0338 02/13/18 0327 02/14/18 0631  WBC 5.3 11.8* 9.5  RBC 4.47 4.18 3.95  HGB 12.4 11.9* 11.1*  HCT 39.7 36.9 35.4  MCV 88.8 88.3 89.7  MCH 27.8 28.3 28.1  MCHC 31.3* 32.1 31.3*  RDW 20.4* 20.2* 20.0*  PLT 171 186 183    Cardiac Enzymes Recent Labs  Lab 02/12/18 1334 02/12/18 2242 02/13/18 0327 02/13/18 1043  TROPONINI 5.80* 6.02* 6.93* 5.62*   No results for input(s): TROPIPOC in the last 168 hours.   BNPNo results for input(s): BNP, PROBNP in the last 168 hours.   DDimer No results for input(s): DDIMER in the last 168 hours.   Radiology    Dg Chest Port 1 View  Result Date: 02/15/2018 IMPRESSION: No active disease. Electronically Signed   By: Alcide CleverMark  Lukens M.D.   On: 02/15/2018 07:46    Cardiac Studies   [facility]   Conclusions: 1. Severe  native coronary disease, including diffusely calcified LAD and dominant LCx with 80-90% lesions. 2. Widely patent LIMA-LAD and SVG-OM.  The SVG-OM graft does not adequately supply the distal LCx and its PL/PDA branches. 3. Severely elevated left heart, right heart, and pulmonary artery pressures. 4. Normal Fick cardiac output/index.  Recommendations: 1. Medical therapy of multivessel CAD.  PCI to the mid LCx could be considered but would like require orbital atherectomy.  This should only be attempted for refractory symptoms after the patient has been optimized from a heart failure standpoint. 2. Initiate isosorbide mononitrate 15 mg daily; uptitrate as tolerated. 3. Hemodialysis today and tomorrow with removal of as much fluid as tolerated.    TTE 02/13/2018: Study Conclusions  - Left ventricle: The cavity size was normal. There was moderate   concentric hypertrophy. Systolic function was moderately to   severely reduced. The estimated ejection fraction was in the   range of 30% to 35%. The study is not technically sufficient to   allow evaluation of LV diastolic function. - Mitral valve: Severely calcified annulus. Mildly thickened,   moderately calcified leaflets . There was moderate regurgitation. - Left atrium: The atrium was mildly to moderately dilated. - Tricuspid valve: There was moderate regurgitation. - Pulmonary arteries: Systolic pressure was moderately increased.   PA peak pressure: 50 mm Hg (S).  Impressions:  - Since last echo, EF dropped .  Patient Profile     67 y.o. female with history of coronary artery disease status post two-vessel CABG in July 2017(LIMA to LAD and SVG to OM1)in 2017, tachybradycardia syndrome status post pacemaker placement in 2017, end-stage renal disease on hemodialysis, chronic diastolic heart failure with pulmonary hypertension, peripheral arterial disease, anemia of chronic disease, diabetes mellitus, hypertension and hyperlipidemia who  presented with atypical chest pain and shortness of breath and was found to have a non-ST elevation myocardial infarction.  Assessment & Plan    NSTEMI Cath with patent grafts, The mid/distal LCx and its PL/PDA branches are not revascularized by CABG. -- dual antiplatelet therapy with aspirin and low-dose ticagrelor. Nitro sl for severe chest pain consider intervention to circumflex if anginal symptoms persist As she is currently pain-free will have her follow-up as outpatient in clinic   Acute systolic heart failure Had several rounds of hemodialysis this hospitalization  Discussed minimizing her fluid intake Would discuss with nephrology consider Lasix on nondialysis days Very elevated pressures on echo and right heart cath  Hypertension Improved after hemodialysis now running slightly low  Hyperlipidemia: Continue atorvastatin.  Stable  End-stage renal disease Hemodialysis per nephrology  COPD No significant wheezing on exam today. Will need inhalers at home  Anxiety Major issue at baseline  Greater than 50% was spent in counseling and coordination of care with patient Total encounter time 25 minutes or more   Signed: Dossie Arbour  M.D., Ph.D. [facility]

## 2018-02-16 NOTE — Progress Notes (Signed)
Chaplain provided active listening, prayers, blessings of assurance, and a pastoral presence as the patient and family discussed the patient's condition. The patient and family seek reconciliation during this difficult time. Chaplain lifted up grace, mercy, and compassion for the son and girlfriend.

## 2018-02-16 NOTE — Progress Notes (Signed)
[facility]  ROUNDING NOTE   Subjective:   [name] admitted to [facility] on 02/11/2018 for Atypical chest pain [R07.89] Chest pain [R07.9] Chronic obstructive pulmonary disease, unspecified COPD type (HCC) [J44.9]  Patient is doing overall better today Her breathing is close to baseline She is currently on oxygen by nasal cannula 1700 cc of fluid was removed with hemodialysis yesterday   Objective:  Vital signs in last 24 hours:  Temp:  [97.6 F (36.4 C)-97.8 F (36.6 C)] 97.7 F (36.5 C) (04/11 0450) Pulse Rate:  [82-104] 101 (04/11 1129) Resp:  [13-20] 17 (04/11 1129) BP: (104-181)/(65-110) 104/65 (04/11 1129) SpO2:  [96 %-100 %] 100 % (04/11 1129) Weight:  [152 lb 8.9 oz (69.2 kg)-156 lb 1.4 oz (70.8 kg)] 152 lb 8.9 oz (69.2 kg) (04/10 1730)  Weight change: 2 lb 1.4 oz (0.946 kg) Filed Weights   02/14/18 1445 02/15/18 1510 02/15/18 1730  Weight: 156 lb 8.4 oz (71 kg) 156 lb 1.4 oz (70.8 kg) 152 lb 8.9 oz (69.2 kg)    Intake/Output: I/O last 3 completed shifts: In: 478 [P.O.:478] Out: 1812 [Urine:100; Other:1712]   Intake/Output this shift:  Total I/O In: 240 [P.O.:240] Out: -   Physical Exam: General: NAD, sitting   Head: Normocephalic, atraumatic. Moist oral mucosal membranes  Eyes: Anicteric,    Neck: Supple,    Lungs:  Clear today  Heart: Regular rate and rhythm  Abdomen:  Soft, nontender,   Extremities: Trace peripheral edema.    Neurologic: Nonfocal, moving all four extremities  Skin: Healing left first toe ulcer  Access: Left upper arm AVF    Basic Metabolic Panel: Recent Labs  Lab 02/11/18 1659 02/11/18 2136 02/12/18 0338 02/14/18 0631  NA 136  --  135 134*  K 4.6  --  4.9 5.6*  CL 92*  --  92* 97*  CO2 32  --  25 27  GLUCOSE 245*  --  326* 280*  BUN 33*  --  41* 57*  CREATININE 6.58* 6.70* 7.09* 6.17*  CALCIUM 9.7  --  9.3 8.9    Liver Function Tests: No results for input(s): AST, ALT, ALKPHOS, BILITOT, PROT,  ALBUMIN in the last 168 hours. No results for input(s): LIPASE, AMYLASE in the last 168 hours. No results for input(s): AMMONIA in the last 168 hours.  CBC: Recent Labs  Lab 02/11/18 1659 02/11/18 2136 02/12/18 0338 02/13/18 0327 02/14/18 0631  WBC 6.2 5.8 5.3 11.8* 9.5  HGB 12.8 12.7 12.4 11.9* 11.1*  HCT 40.2 39.1 39.7 36.9 35.4  MCV 88.2 88.3 88.8 88.3 89.7  PLT 198 179 171 186 183    Cardiac Enzymes: Recent Labs  Lab 02/12/18 0907 02/12/18 1334 02/12/18 2242 02/13/18 0327 02/13/18 1043  TROPONINI 3.09* 5.80* 6.02* 6.93* 5.62*    BNP: Invalid input(s): POCBNP  CBG: Recent Labs  Lab 02/15/18 1314 02/15/18 1812 02/15/18 2140 02/16/18 0730 02/16/18 1128  GLUCAP 440* 104* 212* 248* 258*    Microbiology: Results for orders placed or performed during the hospital encounter of 02/11/18  MRSA PCR Screening     Status: None   Collection Time: 02/15/18 11:14 AM  Result Value Ref Range Status   MRSA by PCR NEGATIVE NEGATIVE Final    Comment:        The GeneXpert MRSA Assay (FDA approved for NASAL specimens only), is one component of a comprehensive MRSA colonization surveillance program. It is not intended to diagnose MRSA infection nor to guide or monitor treatment  for MRSA infections. Performed at [facility], [address] [place], [place] [address]     Coagulation Studies: Recent Labs    02/14/18 0631  LABPROT 14.8  INR 1.17    Urinalysis: No results for input(s): COLORURINE, LABSPEC, PHURINE, GLUCOSEU, HGBUR, BILIRUBINUR, KETONESUR, PROTEINUR, UROBILINOGEN, NITRITE, LEUKOCYTESUR in the last 72 hours.  Invalid input(s): APPERANCEUR    Imaging: Dg Chest Port 1 View  Result Date: 02/15/2018 CLINICAL DATA:  Shortness of Breath EXAM: PORTABLE CHEST 1 VIEW COMPARISON:  02/11/2018 FINDINGS: Cardiac shadow is mildly enlarged. Postsurgical changes are seen. Pacemaker is noted in satisfactory position. The lungs are well aerated  bilaterally. No bony abnormality is seen. IMPRESSION: No active disease. Electronically Signed   By: Alcide Clever M.D.   On: 02/15/2018 07:46     Medications:   . sodium chloride     . aspirin EC  81 mg Oral QHS  . atorvastatin  80 mg Oral QPM  . calcium acetate  1,334 mg Oral TID WC  . Difluprednate  1 drop Left Eye Daily  . docusate sodium  100 mg Oral BID  . feeding supplement (NEPRO CARB STEADY)  237 mL Oral BID BM  . ferrous sulfate  325 mg Oral Q breakfast  . heparin  5,000 Units Subcutaneous Q8H  . insulin aspart  0-15 Units Subcutaneous TID WC  . insulin aspart  0-5 Units Subcutaneous QHS  . insulin aspart  10 Units Subcutaneous TID WC  . insulin glargine  12 Units Subcutaneous BID  . isosorbide mononitrate  15 mg Oral Daily  . latanoprost  1 drop Both Eyes QHS  . levalbuterol  1.25 mg Nebulization Q6H  . losartan  100 mg Oral QHS  . metoprolol succinate  50 mg Oral BID  . moxifloxacin  1 drop Left Eye QID  . multivitamin  1 tablet Oral QHS  . pantoprazole  40 mg Oral Daily  . predniSONE  40 mg Oral Q breakfast  . sodium chloride flush  3 mL Intravenous Q12H  . ticagrelor  60 mg Oral BID  . timolol  1 drop Left Eye BID  . torsemide  100 mg Oral Daily  . vitamin C  250 mg Oral BID   sodium chloride, albuterol, ALPRAZolam, alum & mag hydroxide-simeth, chlorpheniramine-HYDROcodone, diphenhydrAMINE, fluticasone, nitroGLYCERIN, ondansetron **OR** ondansetron (ZOFRAN) IV, ondansetron, oxyCODONE, sodium chloride flush, traMADol  Assessment/ Plan:  [name] is a 68 y.o. white female with end stage renal disease on hemodialysis, coronary artery disease, hypertension, congestive heart failure, CVA, pulmonary hypertension, peripheral arterial disease, hyperlipidemia, GERD, glaucoma, COPD, left first toe amputation.   MWF [facility] [employer]   1. End Stage Renal Disease with Acute Pulmonary edema: on hemodialysis MWF.  -   3 L of fluid was removed with  hemodialysis on Tuesday.  Another 1.7 L removed on Wednesday Overall clinically feels better. Next dialysis to be planned for Friday if still in the hospital Okay to discharge from renal standpoint  2.  NSTEMI - cardiology team is following - Echo : LVEF 30-35%, mod Pulm HTN, mod MR - Cardiac cath 02/14/18- Multivessel CAD.  - optimize medical therapy and volume status - Isosorbide   3. Anemia of chronic kidney disease: hemoglobin within normal limits.  - Continue Mircera as outpatient - Hgb 11.1 (at goal)  4. Secondary Hyperparathyroidism:  - Calcium acetate with meals.      LOS: 5 Nichole Hurst 4/11/201912:37 PM

## 2018-02-16 NOTE — Progress Notes (Signed)
[facility] [place] [facility]                                                                                                                                                                                  Patient Demographics   Nichole EvensGloria Hurst, is a 67 y.o. female, DOB - 04/01/1951, OZH:[id number]  Admit date - 02/11/2018   Admitting Physician Ramonita LabAruna Gouru, MD  Outpatient [facility]   LOS - 5  Subjective: Patient seen and evaluated today  Feels less short of breath Insulin adjusted today for elevated blood sugars No complaints of any chest pain today   Review of Systems:   CONSTITUTIONAL: No documented fever. No fatigue, weakness. No weight gain, no weight loss.  EYES: No blurry or double vision.  ENT: No tinnitus. No postnasal drip. No redness of the oropharynx.  RESPIRATORY: Has decreased cough, has decreased wheezing, no hemoptysis. No dyspnea.  CARDIOVASCULAR: Has chest tightness. No orthopnea. No palpitations. No syncope.  GASTROINTESTINAL: No nausea, no vomiting or diarrhea. No abdominal pain. No melena or hematochezia.  GENITOURINARY: No dysuria or hematuria.  ENDOCRINE: No polyuria or nocturia. No heat or cold intolerance.  HEMATOLOGY: No anemia. No bruising. No bleeding.  INTEGUMENTARY: No rashes. No lesions.  MUSCULOSKELETAL: No arthritis. No swelling. No gout.  NEUROLOGIC: No numbness, tingling, or ataxia. No seizure-type activity.  PSYCHIATRIC: No anxiety. No insomnia. No ADD.    Vitals:   Vitals:   02/16/18 0450 02/16/18 0743 02/16/18 1129 02/16/18 1329  BP: 104/70  104/65 (!) 119/58  Pulse: 96  (!) 101 76  Resp: 18  17   Temp: 97.7 F (36.5 C)     TempSrc: Oral     SpO2: 99% 96% 100% 100%  Weight:      Height:        Wt Readings from Last 3 Encounters:  02/15/18 69.2 kg (152 lb 8.9 oz)  01/31/18 66.7 kg (147 lb)  11/30/17 70.3 kg (154 lb 15.7 oz)     Intake/Output Summary (Last 24  hours) at 02/16/2018 1609 Last data filed at 02/16/2018 1113 Gross per 24 hour  Intake 358 ml  Output 1812 ml  Net -1454 ml    Physical Exam:   GENERAL: Pleasant-appearing female patient in no apparent distress on oxygen via nasal canula HEAD, EYES, EARS, NOSE AND THROAT: Atraumatic, normocephalic. Extraocular muscles are intact. Pupils equal and reactive to light. Sclerae anicteric. No conjunctival injection. No oro-pharyngeal erythema.  NECK: Supple. There is no jugular venous distention. No bruits, no lymphadenopathy, no thyromegaly.  HEART: Regular rate and rhythm,. No murmurs, no rubs, no clicks.  LUNGS:Improved airflow in both lungs, bilateral wheezing decreased ABDOMEN: Soft, flat, nontender, nondistended. Has good bowel sounds. No hepatosplenomegaly appreciated.  EXTREMITIES: No evidence of any cyanosis, clubbing, or peripheral edema.  +2 pedal and radial pulses bilaterally.  NEUROLOGIC: The patient is alert, awake, and oriented x3 with no focal motor or sensory deficits appreciated bilaterally.  SKIN: Moist and warm with no rashes appreciated.  Psych: Not anxious, depressed LN: No inguinal LN enlargement    Antibiotics   Anti-infectives (From admission, onward)   Start     Dose/Rate Route Frequency Ordered Stop   02/11/18 2130  azithromycin (ZITHROMAX) tablet 500 mg     500 mg Oral Daily 02/11/18 2127 02/13/18 1513      Medications   Scheduled Meds: . aspirin EC  81 mg Oral QHS  . atorvastatin  80 mg Oral QPM  . calcium acetate  1,334 mg Oral TID WC  . Difluprednate  1 drop Left Eye Daily  . docusate sodium  100 mg Oral BID  . feeding supplement (NEPRO CARB STEADY)  237 mL Oral BID BM  . ferrous sulfate  325 mg Oral Q breakfast  . heparin  5,000 Units Subcutaneous Q8H  . insulin aspart  0-15 Units Subcutaneous TID WC  . insulin aspart  0-5 Units Subcutaneous QHS  . insulin aspart  10 Units Subcutaneous TID WC  . [START ON 02/17/2018] insulin glargine  14 Units  Subcutaneous Daily  . isosorbide mononitrate  15 mg Oral Daily  . latanoprost  1 drop Both Eyes QHS  . levalbuterol  1.25 mg Nebulization Q6H  . losartan  100 mg Oral QHS  . metoprolol succinate  50 mg Oral BID  . moxifloxacin  1 drop Left Eye QID  . multivitamin  1 tablet Oral QHS  . pantoprazole  40 mg Oral Daily  . predniSONE  40 mg Oral Q breakfast  . sodium chloride flush  3 mL Intravenous Q12H  . ticagrelor  60 mg Oral BID  . timolol  1 drop Left Eye BID  . torsemide  100 mg Oral Daily  . vitamin C  250 mg Oral BID   Continuous Infusions: . sodium chloride     PRN Meds:.sodium chloride, albuterol, ALPRAZolam, alum & mag hydroxide-simeth, chlorpheniramine-HYDROcodone, diphenhydrAMINE, fluticasone, nitroGLYCERIN, ondansetron **OR** ondansetron (ZOFRAN) IV, ondansetron, oxyCODONE, sodium chloride flush, traMADol   Data Review:   Micro Results Recent Results (from the past 240 hour(s))  MRSA PCR Screening     Status: None   Collection Time: 02/15/18 11:14 AM  Result Value Ref Range Status   MRSA by PCR NEGATIVE NEGATIVE Final    Comment:        The GeneXpert MRSA Assay (FDA approved for NASAL specimens only), is one component of a comprehensive MRSA colonization surveillance program. It is not intended to diagnose MRSA infection nor to guide or monitor treatment for MRSA infections. Performed at [facility], [address], [place], [place] [address]     Radiology Reports Ct Angio Chest Pe W And/or Wo Contrast  Result Date: 02/11/2018 CLINICAL DATA:  67 year old female presents with left-sided chest pain radiating to the back x2 days worse with deep inspiration. Reports cough. EXAM: CT ANGIOGRAPHY CHEST WITH CONTRAST TECHNIQUE: Multidetector CT imaging of the chest was performed using the standard protocol during bolus administration of intravenous contrast. Multiplanar CT image reconstructions and MIPs were obtained to evaluate the vascular anatomy.  CONTRAST:  75mL OMNIPAQUE IOHEXOL 350 MG/ML SOLN COMPARISON:  02/11/2018 CXR  FINDINGS: Cardiovascular: Heart is top normal with aortic atherosclerosis. No aneurysm. Right-sided pacemaker apparatus with right atrial and right ventricular leads are noted. The patient is status post CABG. Moderate atherosclerosis of the great vessels with normal branch pattern off the thoracic aorta. No aortic aneurysm. No apparent dissection though study is tailored for the assessment of the pulmonary arterial system. No acute pulmonary embolus to the segmental branches. Dilatation of the main pulmonary artery to 3.5 cm consistent with changes of chronic pulmonary hypertension. Mediastinum/Nodes: Mild left lower paratracheal lymphadenopathy up to 19 mm short axis with smaller right lower paratracheal lymph nodes measuring up to 8 mm short axis. Patent trachea and mainstem bronchi. Esophagus is unremarkable. No thyromegaly or mass. Lungs/Pleura: No acute pulmonary consolidation, effusion or pneumothorax. No dominant mass. Upper Abdomen: No acute abnormality Musculoskeletal: Schmorl's nodes involving several upper thoracic vertebrae. No acute nor suspicious osseous abnormality. Review of the MIP images confirms the above findings. IMPRESSION: 1. Moderate thoracic aortic and branch vessel atherosclerosis. 2. Status post CABG with native coronary arteriosclerosis. 3. No active pulmonary disease.  No acute pulmonary embolus. 4. Mild mediastinal adenopathy, nonspecific possibly reactive in etiology. Aortic Atherosclerosis (ICD10-I70.0). Electronically Signed   By: Tollie Eth M.D.   On: 02/11/2018 19:06   Dg Chest Port 1 View  Result Date: 02/15/2018 CLINICAL DATA:  Shortness of Breath EXAM: PORTABLE CHEST 1 VIEW COMPARISON:  02/11/2018 FINDINGS: Cardiac shadow is mildly enlarged. Postsurgical changes are seen. Pacemaker is noted in satisfactory position. The lungs are well aerated bilaterally. No bony abnormality is seen. IMPRESSION:  No active disease. Electronically Signed   By: Alcide Clever M.D.   On: 02/15/2018 07:46   Dg Chest Port 1 View  Result Date: 02/11/2018 CLINICAL DATA:  Left-sided chest pain.  Shortness of breath. EXAM: PORTABLE CHEST 1 VIEW COMPARISON:  August 08, 2017 FINDINGS: Stable pacemaker. The heart size borderline. The hila and mediastinum are normal. No pulmonary nodules or masses. No focal infiltrates. IMPRESSION: No active disease. Electronically Signed   By: Gerome Sam III M.D   On: 02/11/2018 17:34     CBC Recent Labs  Lab 02/11/18 1659 02/11/18 2136 02/12/18 0338 02/13/18 0327 02/14/18 0631  WBC 6.2 5.8 5.3 11.8* 9.5  HGB 12.8 12.7 12.4 11.9* 11.1*  HCT 40.2 39.1 39.7 36.9 35.4  PLT 198 179 171 186 183  MCV 88.2 88.3 88.8 88.3 89.7  MCH 27.9 28.6 27.8 28.3 28.1  MCHC 31.7* 32.4 31.3* 32.1 31.3*  RDW 19.8* 20.0* 20.4* 20.2* 20.0*    Chemistries  Recent Labs  Lab 02/11/18 1659 02/11/18 2136 02/12/18 0338 02/14/18 0631  NA 136  --  135 134*  K 4.6  --  4.9 5.6*  CL 92*  --  92* 97*  CO2 32  --  25 27  GLUCOSE 245*  --  326* 280*  BUN 33*  --  41* 57*  CREATININE 6.58* 6.70* 7.09* 6.17*  CALCIUM 9.7  --  9.3 8.9   ------------------------------------------------------------------------------------------------------------------ estimated creatinine clearance is 8 mL/min (A) (by C-G formula based on SCr of 6.17 mg/dL (H)). ------------------------------------------------------------------------------------------------------------------ No results for input(s): HGBA1C in the last 72 hours. ------------------------------------------------------------------------------------------------------------------ No results for input(s): CHOL, HDL, LDLCALC, TRIG, CHOLHDL, LDLDIRECT in the last 72 hours. ------------------------------------------------------------------------------------------------------------------ No results for input(s): TSH, T4TOTAL, T3FREE, THYROIDAB in the last  72 hours.  Invalid input(s): FREET3 ------------------------------------------------------------------------------------------------------------------ No results for input(s): VITAMINB12, FOLATE, FERRITIN, TIBC, IRON, RETICCTPCT in the last 72 hours.  Coagulation profile Recent Labs  Lab  02/12/18 1334 02/14/18 0631  INR 1.08 1.17    No results for input(s): DDIMER in the last 72 hours.  Cardiac Enzymes Recent Labs  Lab 02/12/18 2242 02/13/18 0327 02/13/18 1043  TROPONINI 6.02* 6.93* 5.62*   ------------------------------------------------------------------------------------------------------------------ Invalid input(s): POCBNP    Assessment & Plan   67 year old female patient with history of end-stage renal disease on dialysis, COPD, hyperlipidemia, non-insulin-dependent diabetes mellitus, coronary artery disease, CABG currently under hospitalist service for COPD exacerbation and elevated troponin and chest tightness.  1.  Non-STEMI Status post cardiac catheterization 1. Severe native coronary disease, including diffusely calcified LAD and dominant LCx with 80-90% lesions. 2. Widely patent LIMA-LAD and SVG-OM. The SVG-OM graft does not adequately supply the distal LCx and its PL/PDA branches. 3. Severely elevated left heart, right heart, and pulmonary artery pressures. 4. Normal Fick cardiac output/index.  Medical therapy for multivessel coronary artery disease Continue aspirin, Brilinta, Lipitor, beta-blocker Cozaar and statin Echo shows reduced systolic function of 30 to 35% Counseling given by cardiology today  2.  Systolic congestive heart failure with pulmonary hypertension Volume control by hemodialysis Dialysis today  3.  End-stage renal disease on dialysis Appreciate nephrology follow-up Will discuss with nephrology for Lasix on nondialysis days  4.  Acute COPD exacerbation Switch to PO steroids tomorrow bronchodilator treatments Zithromax  antibiotic Oxygen via nasal cannula  5.Hyperkalemia Repeat levels tomorrow  6.  Uncontrolled diabetes mellitus Lantus insulin 12 units Jerome BID Increas meal time insulin to 10 units tid  7. DVT prophylaxis with Trilby heparin  8. Disposition  Had multiple sessions of dialysis to remove excess fluid Possible discharge in a.m. if clinically stable and okay with cardiology and nephrology.       Code Status Orders  (From admission, onward)        Start     Ordered   02/11/18 2127  Full code  Continuous     02/11/18 2127    Code Status History    Date Active Date Inactive Code Status Order ID Comments User Context   11/28/2017 1726 11/30/2017 2143 Full Code [id number]  Altamese Dilling, MD Inpatient   08/08/2017 1036 08/09/2017 1855 Full Code [id number]  Enedina Finner, MD Inpatient   06/13/2017 1100 06/13/2017 1602 Full Code [id number]  Annice Needy, MD Inpatient      Time Spent in minutes   35  Greater than 50% of time spent in care coordination and counseling patient regarding the condition and plan of care.   Ihor Austin M.D on 02/16/2018 at 4:09 PM  Between 7am to 6pm - Pager - [phone]  After 6pm go to [email or web address] - Social research officer, government  [employer]   Office  [phone]

## 2018-02-17 LAB — GLUCOSE, CAPILLARY
GLUCOSE-CAPILLARY: 116 mg/dL — AB (ref 65–99)
Glucose-Capillary: 144 mg/dL — ABNORMAL HIGH (ref 65–99)

## 2018-02-17 LAB — CBC
HEMATOCRIT: 33.4 % — AB (ref 35.0–47.0)
Hemoglobin: 10.8 g/dL — ABNORMAL LOW (ref 12.0–16.0)
MCH: 29 pg (ref 26.0–34.0)
MCHC: 32.3 g/dL (ref 32.0–36.0)
MCV: 89.5 fL (ref 80.0–100.0)
Platelets: 153 10*3/uL (ref 150–440)
RBC: 3.73 MIL/uL — ABNORMAL LOW (ref 3.80–5.20)
RDW: 19.7 % — AB (ref 11.5–14.5)
WBC: 9.9 10*3/uL (ref 3.6–11.0)

## 2018-02-17 LAB — BASIC METABOLIC PANEL
ANION GAP: 11 (ref 5–15)
BUN: 83 mg/dL — ABNORMAL HIGH (ref 6–20)
CALCIUM: 8.7 mg/dL — AB (ref 8.9–10.3)
CO2: 26 mmol/L (ref 22–32)
CREATININE: 5.97 mg/dL — AB (ref 0.44–1.00)
Chloride: 96 mmol/L — ABNORMAL LOW (ref 101–111)
GFR calc non Af Amer: 7 mL/min — ABNORMAL LOW (ref >60)
GFR, EST AFRICAN AMERICAN: 8 mL/min — AB (ref >60)
Glucose, Bld: 141 mg/dL — ABNORMAL HIGH (ref 65–99)
Potassium: 4.5 mmol/L (ref 3.5–5.1)
Sodium: 133 mmol/L — ABNORMAL LOW (ref 135–145)

## 2018-02-17 LAB — PHOSPHORUS: Phosphorus: 2.7 mg/dL (ref 2.5–4.6)

## 2018-02-17 MED ORDER — ALPRAZOLAM 0.5 MG PO TABS: 0.5000 mg | ORAL_TABLET | Freq: Two times a day (BID) | ORAL | 0 refills | Status: AC | PRN

## 2018-02-17 MED ORDER — HEPARIN SODIUM (PORCINE) 1000 UNIT/ML DIALYSIS
1000.0000 [IU] | INTRAMUSCULAR | Status: DC | PRN
  Filled 2018-02-17: qty 1

## 2018-02-17 MED ORDER — TORSEMIDE 100 MG PO TABS: 100.0000 mg | ORAL_TABLET | Freq: Every day | ORAL | 1 refills | Status: DC

## 2018-02-17 MED ORDER — PENTAFLUOROPROP-TETRAFLUOROETH EX AERO
1.0000 "application " | INHALATION_SPRAY | CUTANEOUS | Status: DC | PRN
  Filled 2018-02-17: qty 30

## 2018-02-17 MED ORDER — SODIUM CHLORIDE 0.9 % IV SOLN: 100.0000 mL | INTRAVENOUS | Status: DC | PRN

## 2018-02-17 MED ORDER — ALTEPLASE 2 MG IJ SOLR: 2.0000 mg | Freq: Once | INTRAMUSCULAR | Status: DC | PRN

## 2018-02-17 MED ORDER — PREDNISONE 10 MG PO TABS: 10.0000 mg | ORAL_TABLET | Freq: Every day | ORAL | 0 refills | Status: DC

## 2018-02-17 MED ORDER — LIDOCAINE HCL (PF) 1 % IJ SOLN
5.0000 mL | INTRAMUSCULAR | Status: DC | PRN
  Filled 2018-02-17: qty 5

## 2018-02-17 MED ORDER — ISOSORBIDE MONONITRATE ER 30 MG PO TB24: 15.0000 mg | ORAL_TABLET | Freq: Every day | ORAL | 1 refills | Status: DC

## 2018-02-17 MED ORDER — METOPROLOL SUCCINATE ER 50 MG PO TB24: 50.0000 mg | ORAL_TABLET | Freq: Two times a day (BID) | ORAL | 1 refills | Status: DC

## 2018-02-17 MED ORDER — MIDODRINE HCL 5 MG PO TABS
10.0000 mg | ORAL_TABLET | Freq: Once | ORAL | Status: AC
  Administered 2018-02-17: 10 mg via ORAL
  Filled 2018-02-17: qty 2

## 2018-02-17 MED ORDER — TRAMADOL HCL 50 MG PO TABS: 50.0000 mg | ORAL_TABLET | Freq: Three times a day (TID) | ORAL | 0 refills | Status: DC | PRN

## 2018-02-17 MED ORDER — TRAMADOL HCL 50 MG PO TABS: 50.0000 mg | ORAL_TABLET | Freq: Three times a day (TID) | ORAL | 0 refills | Status: AC | PRN

## 2018-02-17 MED ORDER — ALPRAZOLAM 0.5 MG PO TABS: 0.5000 mg | ORAL_TABLET | Freq: Two times a day (BID) | ORAL | 0 refills | Status: DC | PRN

## 2018-02-17 MED ORDER — LIDOCAINE-PRILOCAINE 2.5-2.5 % EX CREA
1.0000 "application " | TOPICAL_CREAM | CUTANEOUS | Status: DC | PRN
  Filled 2018-02-17: qty 5

## 2018-02-17 NOTE — Care Management (Signed)
Patient to discharge today.  [facility] and [facility] will be [facility] at [facility].  [facility] to determine if any home services are need, and will arrange if indicated.  Nichole Hurst dialysis liaison notified of discharge.

## 2018-02-17 NOTE — Progress Notes (Signed)
[facility]  ROUNDING NOTE   Subjective:   [name] admitted to [facility] on 02/11/2018 for Atypical chest pain [R07.89] Chest pain [R07.9] Chronic obstructive pulmonary disease, unspecified COPD type (HCC) [J44.9]  Patient seen and evaluated during dialysis. Tolerating well.    Objective:  Vital signs in last 24 hours:  Temp:  [97.8 F (36.6 C)-98.1 F (36.7 C)] 98.1 F (36.7 C) (04/12 1030) Pulse Rate:  [69-101] 94 (04/12 1100) Resp:  [14-23] 23 (04/12 1100) BP: (95-119)/(58-86) 95/64 (04/12 1100) SpO2:  [97 %-100 %] 100 % (04/12 1100) Weight:  [74.4 kg (164 lb 0.4 oz)] 74.4 kg (164 lb 0.4 oz) (04/12 1030)  Weight change:  Filed Weights   02/15/18 1510 02/15/18 1730 02/17/18 1030  Weight: 70.8 kg (156 lb 1.4 oz) 69.2 kg (152 lb 8.9 oz) 74.4 kg (164 lb 0.4 oz)    Intake/Output: I/O last 3 completed shifts: In: 358 [P.O.:358] Out: 200 [Urine:200]   Intake/Output this shift:  Total I/O In: 240 [P.O.:240] Out: -   Physical Exam: General: NAD  Head: Normocephalic, atraumatic. Moist oral mucosal membranes  Eyes: Anicteric  Neck: Supple   Lungs:  Clear bilateral  Heart: Regular rate and rhythm  Abdomen:  Soft, nontender, BS present  Extremities: Trace peripheral edema.    Neurologic: Nonfocal, moving all four extremities  Skin: Healing left first toe ulcer  Access: Left upper arm AVF    Basic Metabolic Panel: Recent Labs  Lab 02/11/18 1659 02/11/18 2136 02/12/18 0338 02/14/18 0631 02/17/18 0547  NA 136  --  135 134* 133*  K 4.6  --  4.9 5.6* 4.5  CL 92*  --  92* 97* 96*  CO2 32  --  25 27 26   GLUCOSE 245*  --  326* 280* 141*  BUN 33*  --  41* 57* 83*  CREATININE 6.58* 6.70* 7.09* 6.17* 5.97*  CALCIUM 9.7  --  9.3 8.9 8.7*    Liver Function Tests: No results for input(s): AST, ALT, ALKPHOS, BILITOT, PROT, ALBUMIN in the last 168 hours. No results for input(s): LIPASE, AMYLASE in the last 168 hours. No results for input(s): AMMONIA  in the last 168 hours.  CBC: Recent Labs  Lab 02/11/18 1659 02/11/18 2136 02/12/18 0338 02/13/18 0327 02/14/18 0631  WBC 6.2 5.8 5.3 11.8* 9.5  HGB 12.8 12.7 12.4 11.9* 11.1*  HCT 40.2 39.1 39.7 36.9 35.4  MCV 88.2 88.3 88.8 88.3 89.7  PLT 198 179 171 186 183    Cardiac Enzymes: Recent Labs  Lab 02/12/18 0907 02/12/18 1334 02/12/18 2242 02/13/18 0327 02/13/18 1043  TROPONINI 3.09* 5.80* 6.02* 6.93* 5.62*    BNP: Invalid input(s): POCBNP  CBG: Recent Labs  Lab 02/16/18 0730 02/16/18 1128 02/16/18 1629 02/16/18 2112 02/17/18 0737  GLUCAP 248* 258* 192* 123* 144*    Microbiology: Results for orders placed or performed during the hospital encounter of 02/11/18  MRSA PCR Screening     Status: None   Collection Time: 02/15/18 11:14 AM  Result Value Ref Range Status   MRSA by PCR NEGATIVE NEGATIVE Final    Comment:        The GeneXpert MRSA Assay (FDA approved for NASAL specimens only), is one component of a comprehensive MRSA colonization surveillance program. It is not intended to diagnose MRSA infection nor to guide or monitor treatment for MRSA infections. Performed at [facility], [address] [place], [place] [address]     Coagulation Studies: No results for input(s):  LABPROT, INR in the last 72 hours.  Urinalysis: No results for input(s): COLORURINE, LABSPEC, PHURINE, GLUCOSEU, HGBUR, BILIRUBINUR, KETONESUR, PROTEINUR, UROBILINOGEN, NITRITE, LEUKOCYTESUR in the last 72 hours.  Invalid input(s): APPERANCEUR    Imaging: No results found.   Medications:   . sodium chloride    . sodium chloride    . sodium chloride     . aspirin EC  81 mg Oral QHS  . atorvastatin  80 mg Oral QPM  . calcium acetate  1,334 mg Oral TID WC  . Difluprednate  1 drop Left Eye Daily  . docusate sodium  100 mg Oral BID  . feeding supplement (NEPRO CARB STEADY)  237 mL Oral BID BM  . ferrous sulfate  325 mg Oral Q breakfast  . heparin  5,000  Units Subcutaneous Q8H  . insulin aspart  0-15 Units Subcutaneous TID WC  . insulin aspart  0-5 Units Subcutaneous QHS  . insulin aspart  10 Units Subcutaneous TID WC  . insulin glargine  14 Units Subcutaneous Daily  . isosorbide mononitrate  15 mg Oral Daily  . latanoprost  1 drop Both Eyes QHS  . levalbuterol  1.25 mg Nebulization Q6H  . losartan  100 mg Oral QHS  . metoprolol succinate  50 mg Oral BID  . moxifloxacin  1 drop Left Eye QID  . multivitamin  1 tablet Oral QHS  . pantoprazole  40 mg Oral Daily  . predniSONE  40 mg Oral Q breakfast  . sodium chloride flush  3 mL Intravenous Q12H  . ticagrelor  60 mg Oral BID  . timolol  1 drop Left Eye BID  . torsemide  100 mg Oral Daily  . vitamin C  250 mg Oral BID   sodium chloride, sodium chloride, sodium chloride, albuterol, ALPRAZolam, alteplase, alum & mag hydroxide-simeth, chlorpheniramine-HYDROcodone, diphenhydrAMINE, fluticasone, heparin, lidocaine (PF), lidocaine-prilocaine, nitroGLYCERIN, ondansetron **OR** ondansetron (ZOFRAN) IV, ondansetron, oxyCODONE, pentafluoroprop-tetrafluoroeth, sodium chloride flush, traMADol  Assessment/ Plan:  Nichole Hurst is a 67 y.o. white female with end stage renal disease on hemodialysis, coronary artery disease, hypertension, congestive heart failure, CVA, pulmonary hypertension, peripheral arterial disease, hyperlipidemia, GERD, glaucoma, COPD, left first toe amputation.   MWF [facility] [employer]   1. End Stage Renal Disease with Acute Pulmonary edema: on hemodialysis MWF.  -   Patient had HD on Tuesday and Wednesday of this week.  Seen and evaluated during HD today.  Tolerating well, UF target 1.5kg.   2.  NSTEMI - cardiology team is following - Echo : LVEF 30-35%, mod Pulm HTN, mod MR - Cardiac cath 02/14/18- Multivessel CAD.  - optimize medical therapy and volume status  3. Anemia of chronic kidney disease: hemoglobin within normal limits.  - Continue Mircera as  outpatient, hgb 11 at last check.   4. Secondary Hyperparathyroidism:  - Continue calcium acetate 1334     LOS: 6 Nichole Hurst 4/12/201911:10 AM

## 2018-02-17 NOTE — Progress Notes (Signed)
This note also relates to the following rows which could not be included: Pulse Rate - Cannot attach notes to unvalidated device data Resp - Cannot attach notes to unvalidated device data BP - Cannot attach notes to unvalidated device data  Hd completed  

## 2018-02-17 NOTE — Progress Notes (Signed)
Pre HD assessment    02/17/18 1035  Neurological  Level of Consciousness Alert  Orientation Level Oriented X4  Respiratory  Respiratory Pattern Regular;Unlabored  Chest Assessment Chest expansion symmetrical  Cardiac  ECG Monitor Yes  Cardiac Rhythm NSR  Antiarrhythmic device  Antiarrhythmic device Permanent Pacemaker  Vascular  R Radial Pulse +2  L Radial Pulse +2  Integumentary  Integumentary (WDL) X  Skin Color Appropriate for ethnicity  Musculoskeletal  Musculoskeletal (WDL) X  Generalized Weakness Yes  Assistive Device None  GU Assessment  Genitourinary (WDL) X  Genitourinary Symptoms  (HD)  Psychosocial  Psychosocial (WDL) WDL

## 2018-02-17 NOTE — Progress Notes (Signed)
Pre HD assessment    02/17/18 1030  Vital Signs  Temp 98.1 F (36.7 C)  Temp Source Oral  Pulse Rate 94  Pulse Rate Source Monitor  Resp 14  BP 103/86  BP Location Right Arm  BP Method Automatic  Patient Position (if appropriate) Lying  Oxygen Therapy  SpO2 100 %  O2 Device Room Air  Pain Assessment  Pain Scale 0-10  Pain Score 0  Dialysis Weight  Weight 74.4 kg (164 lb 0.4 oz)  Type of Weight Pre-Dialysis  Time-Out for Hemodialysis  What Procedure? HD  Pt Identifiers(min of two) First/Last Name;MRN/Account#  Correct Site? Yes  Correct Side? Yes  Correct Procedure? Yes  Consents Verified? Yes  Rad Studies Available? N/A  Safety Precautions Reviewed? Yes  Biochemist, clinicalMachine Checks  Machine Number  ([id number])  Station Number 1  UF/Alarm Test Passed  Conductivity: Meter 13.8  Conductivity: Machine  13.9  pH 7.4  Reverse Osmosis main  Normal Saline Lot Number [id number]  Dialyzer Lot Number [id number]  Disposable Set Lot Number [id number]  Machine Temperature 98.6 F (37 C)  ImmunologistAir Detector Armed and Audible Yes  Blood Lines Intact and Secured Yes  Pre Treatment Patient Checks  Vascular access used during treatment Fistula  Hepatitis B Surface Antigen Results Negative  Date Hepatitis B Surface Antigen Drawn 06/08/17  Hepatitis B Surface Antibody 287  Date Hepatitis B Surface Antibody Drawn 06/08/17  Hemodialysis Consent Verified Yes  Hemodialysis Standing Orders Initiated Yes  ECG (Telemetry) Monitor On Yes  Prime Ordered Normal Saline  Length of  DialysisTreatment -hour(s) 3.5 Hour(s)  Dialyzer Elisio 17H NR  Dialysate 2K, 2.5 Ca  Dialysis Anticoagulant None  Dialysate Flow Ordered 800  Blood Flow Rate Ordered 400 mL/min  Ultrafiltration Goal 1.5 Liters  Pre Treatment Labs CBC;Phosphorus  Dialysis Blood Pressure Support Ordered Normal Saline  Education / Care Plan  Dialysis Education Provided Yes  Documented Education in Care Plan Yes  Fistula / Graft Left Upper arm  Placement  Date/Time: 11/29/17 0900   Placed prior to admission: Yes  Orientation: Left  Access Location: Upper arm  Site Condition No complications  Fistula / Graft Assessment Present;Thrill;Bruit  Drainage Description None

## 2018-02-17 NOTE — Progress Notes (Signed)
Pt discharged per MD order. IV removed. Discharge instructions reviewed with pt and [facility] employee. Questions answered to pt satisfaction. Prescriptions given. Pt taken to [facility] transport in personal wheelchair by [employer].

## 2018-02-17 NOTE — Discharge Summary (Signed)
[facility] [place] [facility]  Nichole Hurst, 67 y.o., DOB 01/13/1951, MRN [id number]. Admission date: 02/11/2018 Discharge Date 02/17/2018 [facility] Admitting Physician Ramonita LabAruna Gouru, MD  Admission Diagnosis   1.  Acute respiratory distress 2.  Acute COPD exacerbation 3.  New left bundle branch block 4.  Coronary artery disease 5.  End-stage renal disease on dialysis 6.  Type 2 diabetes mellitus 7.  Hyperlipidemia Discharge Diagnosis       1.  Acute respiratory distress resolved 2.  Acute COPD exacerbation resolved 3.  Non-STEMI 4.  Coronary artery disease 5.  End-stage renal disease on dialysis 6.  Type 2 diabetes mellitus 7.  Hyperlipidemia  Hospital Course  67 year old elderly female patient with history of coronary artery disease status post CABG in July 2017, end-stage renal disease on dialysis, COPD, hyperlipidemia, non-insulin-dependent diabetes mellitus, hyperlipidemia was admitted on 02/11/2018 for respiratory distress, COPD exacerbation.  Patient was put on aggressive nebulizer treatments and IV Solu-Medrol initially.  She also had left bundle branch block on the EKG which was a new.  Patient continued dialysis with the help of nephrology.  Her troponin was elevated in the hospitalization.  [facility].  She had cardiac catheterization done which showed severe native coronary artery disease, including diffusely calcified LAD and dominant left circumflex with 80-90% lesions.  Severely elevated left heart, right heart and pulmonary artery pressures.  Widely patent LIMA-LAD and SVG-OM.  The SVG-OM graft does not adequately supply the distal left circumflex and its PL/PDA branches.  Medical therapy for multivessel coronary artery disease was recommended.  Patient received aspirin, Brilinta, Lipitor, beta-blocker, Cozaar and statin.  She was worked up with echocardiogram which showed reduced systolic function of  30-35%.  Patient had multiple sessions of hemodialysis in the hospital to remove excess fluid.  She is followed by [facility] in the community.  Patient and family opted for home discharge home with home health services.  Her blood sugars were elevated secondary to steroids in the hospital which were controlled with sliding scale coverage with insulin along with Lantus insulin with the help of diabetic management coordinator.  Patient will be discharged home on oral tapering dose of steroids and adjusted cardiac medications after discussing with cardiology.  She will continue diuresis with oral torsemide on her nondialysis days as recommended by nephrology.  Patient hemodynamically stable will be discharged home today.  Consults   Nephrology consultation Cardiology consultation  Significant Tests:  See full reports for all details    Ct Angio Chest Pe W And/or Wo Contrast  Result Date: 02/11/2018 CLINICAL DATA:  67 year old female presents with left-sided chest pain radiating to the back x2 days worse with deep inspiration. Reports cough. EXAM: CT ANGIOGRAPHY CHEST WITH CONTRAST TECHNIQUE: Multidetector CT imaging of the chest was performed using the standard protocol during bolus administration of intravenous contrast. Multiplanar CT image reconstructions and MIPs were obtained to evaluate the vascular anatomy. CONTRAST:  75mL OMNIPAQUE IOHEXOL 350 MG/ML SOLN COMPARISON:  02/11/2018 CXR FINDINGS: Cardiovascular: Heart is top normal with aortic atherosclerosis. No aneurysm. Right-sided pacemaker apparatus with right atrial and right ventricular leads are noted. The patient is status post CABG. Moderate atherosclerosis of the great vessels with normal branch pattern off the thoracic aorta. No aortic aneurysm. No apparent dissection though study is tailored for the assessment of the pulmonary arterial system. No acute pulmonary embolus to the segmental branches. Dilatation of the main pulmonary artery to  3.5 cm consistent with changes of chronic pulmonary hypertension. Mediastinum/Nodes: Mild left lower paratracheal lymphadenopathy up to 19 mm short axis with smaller right lower paratracheal lymph nodes measuring up to 8 mm short axis. Patent trachea and mainstem bronchi. Esophagus is unremarkable. No thyromegaly or mass. Lungs/Pleura: No acute pulmonary consolidation, effusion or pneumothorax. No dominant mass. Upper Abdomen: No acute abnormality Musculoskeletal: Schmorl's nodes involving several upper thoracic vertebrae. No acute nor suspicious osseous abnormality. Review of the MIP images confirms the above findings. IMPRESSION: 1. Moderate thoracic aortic and branch vessel atherosclerosis. 2. Status post CABG with native coronary arteriosclerosis. 3. No active pulmonary disease.  No acute pulmonary embolus. 4. Mild mediastinal adenopathy, nonspecific possibly reactive in etiology. Aortic Atherosclerosis (ICD10-I70.0). Electronically Signed   By: Tollie Eth M.D.   On: 02/11/2018 19:06   Dg Chest Port 1 View  Result Date: 02/15/2018 CLINICAL DATA:  Shortness of Breath EXAM: PORTABLE CHEST 1 VIEW COMPARISON:  02/11/2018 FINDINGS: Cardiac shadow is mildly enlarged. Postsurgical changes are seen. Pacemaker is noted in satisfactory position. The lungs are well aerated bilaterally. No bony abnormality is seen. IMPRESSION: No active disease. Electronically Signed   By: Alcide Clever M.D.   On: 02/15/2018 07:46   Dg Chest Port 1 View  Result Date: 02/11/2018 CLINICAL DATA:  Left-sided chest pain.  Shortness of breath. EXAM: PORTABLE CHEST 1 VIEW COMPARISON:  August 08, 2017 FINDINGS: Stable pacemaker. The heart size borderline. The hila and mediastinum are normal. No pulmonary nodules or masses. No focal infiltrates. IMPRESSION: No active disease. Electronically Signed   By: Gerome Sam III M.D   On: 02/11/2018 17:34       Today   Subjective:   Nichole Hurst is a 67 year old female patient lying on  the bed in no acute distress. No complaints of any chest pain Decreased shortness of breath No fever and chills  Objective:   Blood pressure 124/90, pulse 72, temperature 97.8 F (36.6 C), temperature source Oral, resp. rate 16, height 5\' 1"  (1.549 m), weight 74.4 kg (164 lb 0.4 oz), SpO2 98 %.  .  Intake/Output Summary (Last 24 hours) at 02/17/2018 1600 Last data filed at 02/17/2018 1345 Gross per 24 hour  Intake 240 ml  Output 1600 ml  Net -1360 ml    Exam VITAL SIGNS: Blood pressure 124/90, pulse 72, temperature 97.8 F (36.6 C), temperature source Oral, resp. rate 16, height 5\' 1"  (1.549 m), weight 74.4 kg (164 lb 0.4 oz), SpO2 98 %.  GENERAL:  68 y.o.-year-old patient lying in the bed with no acute distress.  EYES: Pupils equal, round, reactive to light and accommodation. No scleral icterus. Extraocular muscles intact.  HEENT: Head atraumatic, normocephalic. Oropharynx and nasopharynx clear.  NECK:  Supple, no jugular venous distention. No thyroid enlargement, no tenderness.  LUNGS: Normal breath sounds bilaterally, no wheezing, rales,rhonchi or crepitation. No use of accessory muscles of respiration.  CARDIOVASCULAR: S1, S2 normal. No murmurs, rubs, or gallops.  ABDOMEN: Soft, nontender, nondistended. Bowel sounds present. No organomegaly or mass.  EXTREMITIES: No pedal edema, cyanosis, or clubbing.  NEUROLOGIC: Cranial nerves II through XII are intact. Muscle strength 5/5 in all extremities. Sensation intact. Gait not checked.  PSYCHIATRIC: The patient is alert and oriented x 3.  SKIN: No obvious rash, lesion, or ulcer.   Data Review     CBC w Diff:  Lab Results  Component Value Date   WBC 9.9 02/17/2018   HGB 10.8 (L) 02/17/2018   HCT 33.4 (L) 02/17/2018  PLT 153 02/17/2018   LYMPHOPCT 12 08/08/2017   MONOPCT 4 08/08/2017   EOSPCT 4 08/08/2017   BASOPCT 2 08/08/2017   CMP:  Lab Results  Component Value Date   NA 133 (L) 02/17/2018   K 4.5 02/17/2018   CL  96 (L) 02/17/2018   CO2 26 02/17/2018   BUN 83 (H) 02/17/2018   CREATININE 5.97 (H) 02/17/2018   PROT 7.9 08/08/2017   ALBUMIN 4.2 08/08/2017   BILITOT 0.7 08/08/2017   ALKPHOS 73 08/08/2017   AST 53 (H) 08/08/2017   ALT 8 (L) 08/08/2017  .  Micro Results Recent Results (from the past 240 hour(s))  MRSA PCR Screening     Status: None   Collection Time: 02/15/18 11:14 AM  Result Value Ref Range Status   MRSA by PCR NEGATIVE NEGATIVE Final    Comment:        The GeneXpert MRSA Assay (FDA approved for NASAL specimens only), is one component of a comprehensive MRSA colonization surveillance program. It is not intended to diagnose MRSA infection nor to guide or monitor treatment for MRSA infections. Performed at [facility], [address] [place], [place] [address]         Code Status Orders  (From admission, onward)        Start     Ordered   02/11/18 2127  Full code  Continuous     02/11/18 2127    Code Status History    Date Active Date Inactive Code Status Order ID Comments User Context   11/28/2017 1726 11/30/2017 2143 Full Code [id number]  Altamese Dilling, MD Inpatient   08/08/2017 1036 08/09/2017 1855 Full Code [id number]  Enedina Finner, MD Inpatient   06/13/2017 1100 06/13/2017 1602 Full Code [id number]  Annice Needy, MD Inpatient          Follow-up Information    Mosetta Pigeon, MD In 2 weeks.   Specialty:  Internal Medicine Why:  Dr. Thedore Mins office was closed you need to call for your follow up. Contact information: [address] [place] [address] [phone]        [facility].   Why:  Patient needs to follow-up with [facility] cardiology and primary care physician. Thank you!          Discharge Medications   Allergies as of 02/17/2018      Reactions   Colchicine Anaphylaxis   Clopidogrel       Medication List    STOP taking these medications   amoxicillin-clavulanate 500-125 MG tablet Commonly known as:   AUGMENTIN   HYDROcodone-acetaminophen 5-325 MG tablet Commonly known as:  NORCO/VICODIN     TAKE these medications   ALPRAZolam 0.5 MG tablet Commonly known as:  XANAX Take 1 tablet (0.5 mg total) by mouth 2 (two) times daily as needed for up to 5 days for anxiety. Notes to patient:  Last dose given to day at 10:20am   aspirin EC 81 MG tablet Take 81 mg by mouth at bedtime.   atorvastatin 80 MG tablet Commonly known as:  LIPITOR Take 80 mg by mouth every evening.   bimatoprost 0.03 % ophthalmic solution Commonly known as:  LUMIGAN Place 1 drop into the right eye at bedtime.   Brinzolamide-Brimonidine 1-0.2 % Susp Apply 1 drop to eye 3 (three) times daily. LEFT EYE   calcium acetate 667 MG capsule Commonly known as:  PHOSLO Take 1,334 mg by mouth 3 (three) times daily with meals.  diclofenac sodium 1 % Gel Commonly known as:  VOLTAREN Apply topically 4 (four) times daily.   diphenhydrAMINE 25 mg capsule Commonly known as:  BENADRYL Take 25 mg by mouth every 4 (four) hours as needed.   DUREZOL 0.05 % Emul Generic drug:  Difluprednate Place 1 drop into the left eye daily.   Ferrous Sulfate 140 (45 Fe) MG Tbcr Take 1 tablet by mouth daily.   FLONASE NA Place 2 puffs into the nose 2 (two) times daily as needed.   Ipratropium-Albuterol 20-100 MCG/ACT Aers respimat Commonly known as:  COMBIVENT Inhale 2 puffs into the lungs every 6 (six) hours.   irbesartan 150 MG tablet Commonly known as:  AVAPRO Take 1 tablet (150 mg total) by mouth at bedtime.   isosorbide mononitrate 30 MG 24 hr tablet Commonly known as:  IMDUR Take 0.5 tablets (15 mg total) by mouth daily.   loperamide 2 MG capsule Commonly known as:  IMODIUM Take by mouth as needed for diarrhea or loose stools.   losartan 100 MG tablet Commonly known as:  COZAAR Take 100 mg by mouth at bedtime.   metoprolol succinate 50 MG 24 hr tablet Commonly known as:  TOPROL-XL Take 1 tablet (50 mg total) by  mouth 2 (two) times daily. Take with or immediately following a meal. What changed:    medication strength  how much to take  when to take this  additional instructions   moxifloxacin 0.5 % ophthalmic solution Commonly known as:  VIGAMOX Place 1 drop into the left eye 4 (four) times daily.   nitroGLYCERIN 0.4 MG SL tablet Commonly known as:  NITROSTAT Place 0.4 mg under the tongue every 5 (five) minutes as needed for chest pain.   ondansetron 4 MG tablet Commonly known as:  ZOFRAN Take 4 mg by mouth every 8 (eight) hours as needed for nausea or vomiting.   predniSONE 10 MG tablet Commonly known as:  DELTASONE Take 1 tablet (10 mg total) by mouth daily with breakfast. Label  & dispense according to the schedule below.  6 tablets day one, then 5 table day 2, then 4 tablets day 3, then 3 tablets day 4, 2 tablets day 5, then 1 tablet day 6, then stop Start taking on:  02/18/2018   ticagrelor 60 MG Tabs tablet Commonly known as:  BRILINTA Take 60 mg by mouth 2 (two) times daily.   timolol 0.5 % ophthalmic solution Commonly known as:  BETIMOL Place 1 drop into the left eye 2 (two) times daily.   torsemide 100 MG tablet Commonly known as:  DEMADEX Take 1 tablet (100 mg total) by mouth daily. Patient to take medication only on non dialysis days   traMADol 50 MG tablet Commonly known as:  ULTRAM Take 1 tablet (50 mg total) by mouth 3 (three) times daily as needed for up to 5 days for moderate pain or severe pain.   Vitamin D (Ergocalciferol) 50000 units Caps capsule Commonly known as:  DRISDOL Take 50,000 Units by mouth every 30 (thirty) days.          Total Time in preparing paper work, data evaluation and todays exam - 35 minutes  Ihor Austin M.D on 02/17/2018 at 4:00 PM [facility]   Office  [phone]

## 2018-02-17 NOTE — Progress Notes (Signed)
HD tx start    02/17/18 1044  Vital Signs  Pulse Rate 96  Pulse Rate Source Monitor  Resp 17  BP 104/60  BP Location Right Arm  BP Method Automatic  Patient Position (if appropriate) Lying  Oxygen Therapy  SpO2 99 %  O2 Device Room Air  During Hemodialysis Assessment  Blood Flow Rate (mL/min) 300 mL/min  Arterial Pressure (mmHg) -100 mmHg  Venous Pressure (mmHg) 120 mmHg  Transmembrane Pressure (mmHg) 60 mmHg  Ultrafiltration Rate (mL/min) 60 mL/min  Dialysate Flow Rate (mL/min) 800 ml/min  Conductivity: Machine  13.9  HD Safety Checks Performed Yes  Dialysis Fluid Bolus Normal Saline  Bolus Amount (mL) 250 mL  Intra-Hemodialysis Comments Tx initiated  Fistula / Graft Left Upper arm  Placement Date/Time: 11/29/17 0900   Placed prior to admission: Yes  Orientation: Left  Access Location: Upper arm  Status Accessed  Needle Size 15

## 2018-04-17 IMAGING — CR DG CHEST 2V
2 series · 2 of 2 positions shown · non-contrast
Comparison: None.

CLINICAL DATA: Shortness of breath

EXAM:
CHEST  2 VIEW

[chest pa]
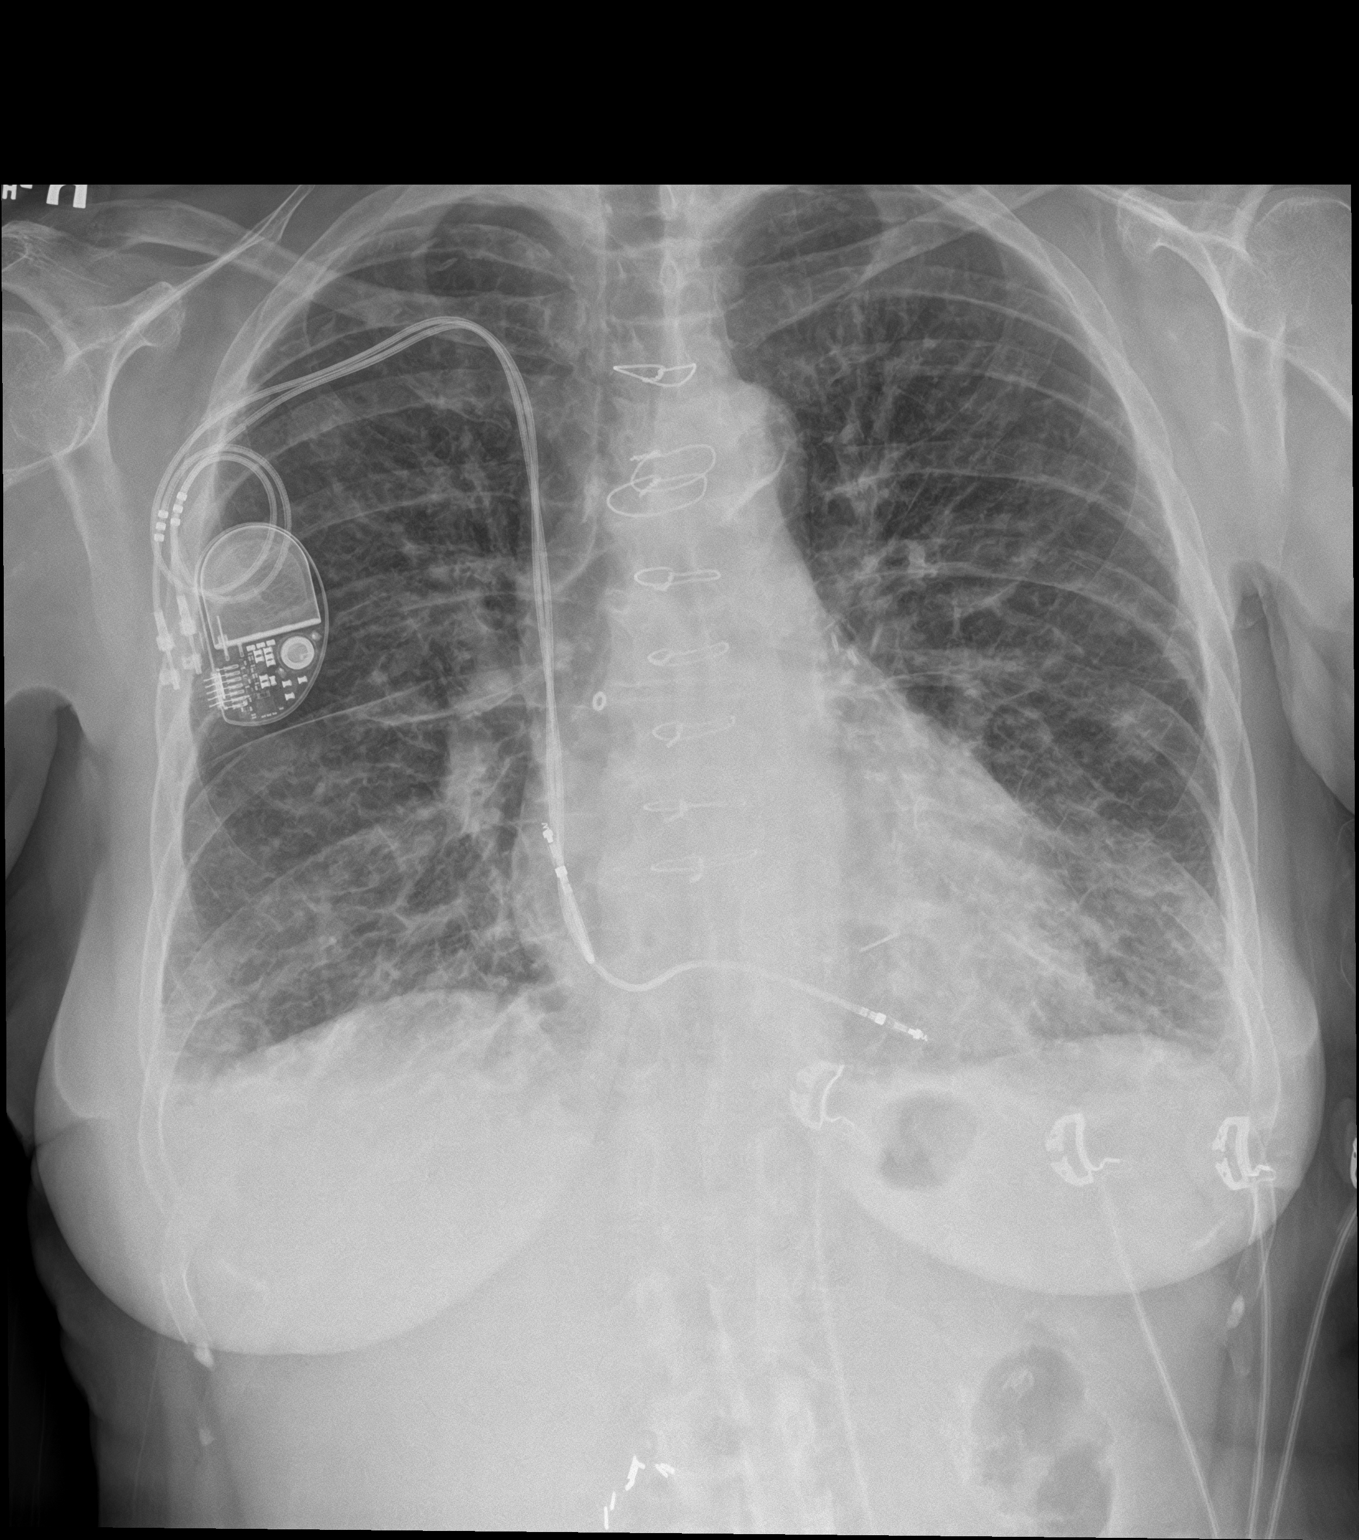

[chest lat]
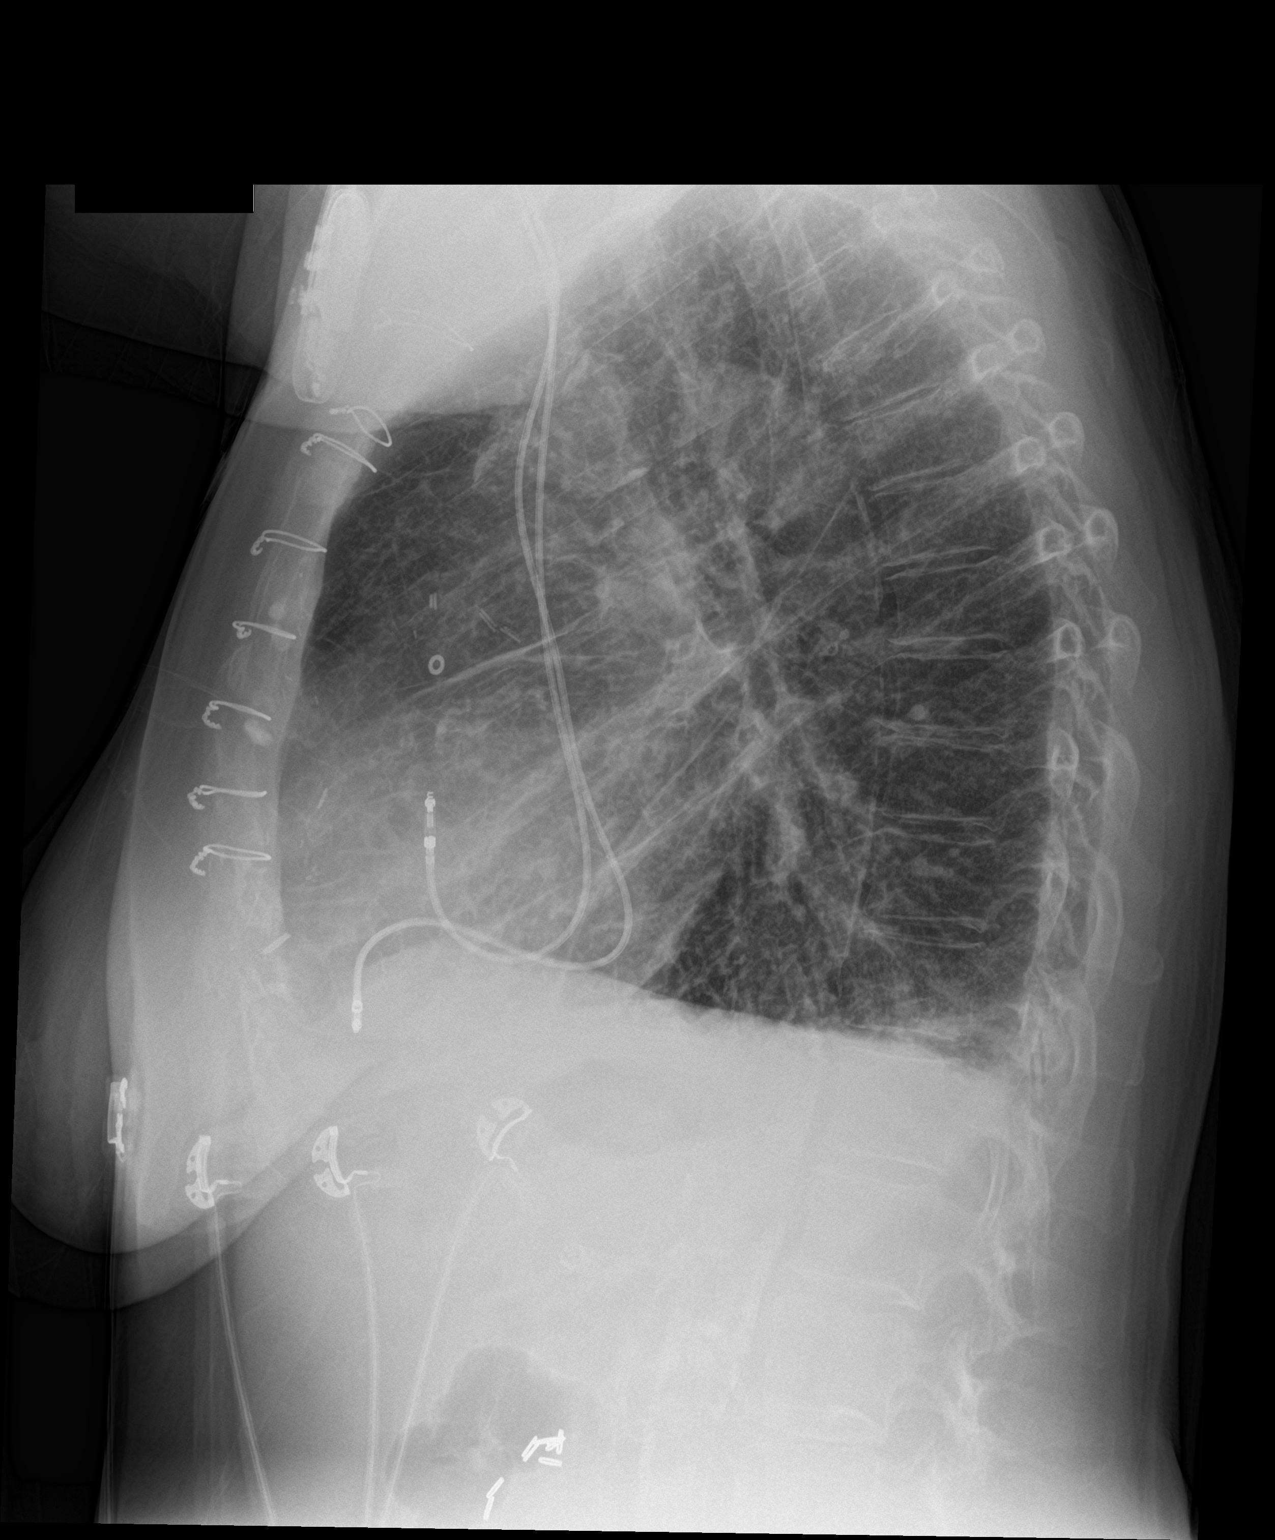

[2 of 2 positions shown; findings below may reference images not displayed]

FINDINGS: There is diffuse interstitial opacity with Kerley lines. Trace
pleural fluid. Borderline heart size. The patient is status post
CABG and dual-chamber pacer implant. Negative aortic and hilar
contours. Large lung volumes with diaphragm flattening.
IMPRESSION: CHF.

## 2018-05-01 ENCOUNTER — Encounter: Payer: Self-pay | Admitting: Medical Oncology

## 2018-05-01 ENCOUNTER — Other Ambulatory Visit: Payer: Self-pay

## 2018-05-01 ENCOUNTER — Emergency Department: Payer: Medicare (Managed Care)

## 2018-05-01 ENCOUNTER — Inpatient Hospital Stay
Admission: EM | Admit: 2018-05-01 | Discharge: 2018-05-06 | DRG: 312 | Disposition: A | Discharge status: Home / Self Care | Payer: Medicare (Managed Care) | Attending: Internal Medicine | Admitting: Internal Medicine

## 2018-05-01 DIAGNOSIS — R945 Abnormal results of liver function studies: Secondary | ICD-10-CM | POA: Diagnosis not present

## 2018-05-01 DIAGNOSIS — K7200 Acute and subacute hepatic failure without coma: Secondary | ICD-10-CM | POA: Diagnosis present

## 2018-05-01 DIAGNOSIS — I953 Hypotension of hemodialysis: Secondary | ICD-10-CM | POA: Diagnosis present

## 2018-05-01 DIAGNOSIS — I2510 Atherosclerotic heart disease of native coronary artery without angina pectoris: Secondary | ICD-10-CM | POA: Diagnosis present

## 2018-05-01 DIAGNOSIS — D631 Anemia in chronic kidney disease: Secondary | ICD-10-CM | POA: Diagnosis present

## 2018-05-01 DIAGNOSIS — K219 Gastro-esophageal reflux disease without esophagitis: Secondary | ICD-10-CM | POA: Diagnosis present

## 2018-05-01 DIAGNOSIS — I132 Hypertensive heart and chronic kidney disease with heart failure and with stage 5 chronic kidney disease, or end stage renal disease: Secondary | ICD-10-CM | POA: Diagnosis present

## 2018-05-01 DIAGNOSIS — N2581 Secondary hyperparathyroidism of renal origin: Secondary | ICD-10-CM | POA: Diagnosis present

## 2018-05-01 DIAGNOSIS — Z8673 Personal history of transient ischemic attack (TIA), and cerebral infarction without residual deficits: Secondary | ICD-10-CM | POA: Diagnosis not present

## 2018-05-01 DIAGNOSIS — Z992 Dependence on renal dialysis: Secondary | ICD-10-CM

## 2018-05-01 DIAGNOSIS — I272 Pulmonary hypertension, unspecified: Secondary | ICD-10-CM | POA: Diagnosis present

## 2018-05-01 DIAGNOSIS — I429 Cardiomyopathy, unspecified: Secondary | ICD-10-CM | POA: Diagnosis not present

## 2018-05-01 DIAGNOSIS — E785 Hyperlipidemia, unspecified: Secondary | ICD-10-CM | POA: Diagnosis present

## 2018-05-01 DIAGNOSIS — R079 Chest pain, unspecified: Secondary | ICD-10-CM

## 2018-05-01 DIAGNOSIS — I959 Hypotension, unspecified: Secondary | ICD-10-CM | POA: Diagnosis not present

## 2018-05-01 DIAGNOSIS — E869 Volume depletion, unspecified: Secondary | ICD-10-CM | POA: Diagnosis present

## 2018-05-01 DIAGNOSIS — E1151 Type 2 diabetes mellitus with diabetic peripheral angiopathy without gangrene: Secondary | ICD-10-CM | POA: Diagnosis present

## 2018-05-01 DIAGNOSIS — H409 Unspecified glaucoma: Secondary | ICD-10-CM | POA: Diagnosis present

## 2018-05-01 DIAGNOSIS — Z7982 Long term (current) use of aspirin: Secondary | ICD-10-CM

## 2018-05-01 DIAGNOSIS — Z950 Presence of cardiac pacemaker: Secondary | ICD-10-CM

## 2018-05-01 DIAGNOSIS — Z951 Presence of aortocoronary bypass graft: Secondary | ICD-10-CM | POA: Diagnosis not present

## 2018-05-01 DIAGNOSIS — E11319 Type 2 diabetes mellitus with unspecified diabetic retinopathy without macular edema: Secondary | ICD-10-CM | POA: Diagnosis present

## 2018-05-01 DIAGNOSIS — N186 End stage renal disease: Secondary | ICD-10-CM | POA: Diagnosis present

## 2018-05-01 DIAGNOSIS — K7589 Other specified inflammatory liver diseases: Secondary | ICD-10-CM | POA: Diagnosis present

## 2018-05-01 DIAGNOSIS — J209 Acute bronchitis, unspecified: Secondary | ICD-10-CM | POA: Diagnosis present

## 2018-05-01 DIAGNOSIS — J439 Emphysema, unspecified: Secondary | ICD-10-CM | POA: Diagnosis present

## 2018-05-01 DIAGNOSIS — Z888 Allergy status to other drugs, medicaments and biological substances status: Secondary | ICD-10-CM

## 2018-05-01 DIAGNOSIS — I248 Other forms of acute ischemic heart disease: Secondary | ICD-10-CM | POA: Diagnosis present

## 2018-05-01 DIAGNOSIS — R7989 Other specified abnormal findings of blood chemistry: Secondary | ICD-10-CM

## 2018-05-01 DIAGNOSIS — J9610 Chronic respiratory failure, unspecified whether with hypoxia or hypercapnia: Secondary | ICD-10-CM | POA: Diagnosis present

## 2018-05-01 DIAGNOSIS — I5022 Chronic systolic (congestive) heart failure: Secondary | ICD-10-CM | POA: Diagnosis present

## 2018-05-01 DIAGNOSIS — R1011 Right upper quadrant pain: Secondary | ICD-10-CM

## 2018-05-01 DIAGNOSIS — Z7189 Other specified counseling: Secondary | ICD-10-CM | POA: Diagnosis not present

## 2018-05-01 DIAGNOSIS — R740 Nonspecific elevation of levels of transaminase and lactic acid dehydrogenase [LDH]: Secondary | ICD-10-CM | POA: Diagnosis not present

## 2018-05-01 DIAGNOSIS — Z87891 Personal history of nicotine dependence: Secondary | ICD-10-CM

## 2018-05-01 DIAGNOSIS — E1122 Type 2 diabetes mellitus with diabetic chronic kidney disease: Secondary | ICD-10-CM | POA: Diagnosis present

## 2018-05-01 DIAGNOSIS — Z79899 Other long term (current) drug therapy: Secondary | ICD-10-CM

## 2018-05-01 DIAGNOSIS — Z515 Encounter for palliative care: Secondary | ICD-10-CM | POA: Diagnosis not present

## 2018-05-01 LAB — CBC WITH DIFFERENTIAL/PLATELET
BASOS PCT: 1 %
Basophils Absolute: 0.1 10*3/uL (ref 0–0.1)
EOS ABS: 0.2 10*3/uL (ref 0–0.7)
Eosinophils Relative: 3 %
HCT: 36 % (ref 35.0–47.0)
HEMOGLOBIN: 11.3 g/dL — AB (ref 12.0–16.0)
Lymphocytes Relative: 7 %
Lymphs Abs: 0.6 10*3/uL — ABNORMAL LOW (ref 1.0–3.6)
MCH: 27.9 pg (ref 26.0–34.0)
MCHC: 31.5 g/dL — AB (ref 32.0–36.0)
MCV: 88.6 fL (ref 80.0–100.0)
MONOS PCT: 2 %
Monocytes Absolute: 0.2 10*3/uL (ref 0.2–0.9)
NEUTROS PCT: 87 %
Neutro Abs: 7.8 10*3/uL — ABNORMAL HIGH (ref 1.4–6.5)
Platelets: 292 10*3/uL (ref 150–440)
RBC: 4.07 MIL/uL (ref 3.80–5.20)
RDW: 20.4 % — AB (ref 11.5–14.5)
WBC: 8.9 10*3/uL (ref 3.6–11.0)

## 2018-05-01 LAB — COMPREHENSIVE METABOLIC PANEL
ALBUMIN: 3.6 g/dL (ref 3.5–5.0)
ALK PHOS: 85 U/L (ref 38–126)
ALT: 12 U/L — ABNORMAL LOW (ref 14–54)
ANION GAP: 15 (ref 5–15)
AST: 18 U/L (ref 15–41)
BILIRUBIN TOTAL: 1 mg/dL (ref 0.3–1.2)
BUN: 21 mg/dL — ABNORMAL HIGH (ref 6–20)
CALCIUM: 8.9 mg/dL (ref 8.9–10.3)
CO2: 27 mmol/L (ref 22–32)
Chloride: 92 mmol/L — ABNORMAL LOW (ref 101–111)
Creatinine, Ser: 4.47 mg/dL — ABNORMAL HIGH (ref 0.44–1.00)
GFR, EST AFRICAN AMERICAN: 11 mL/min — AB (ref >60)
GFR, EST NON AFRICAN AMERICAN: 9 mL/min — AB (ref >60)
GLUCOSE: 241 mg/dL — AB (ref 65–99)
Potassium: 4.1 mmol/L (ref 3.5–5.1)
Sodium: 134 mmol/L — ABNORMAL LOW (ref 135–145)
TOTAL PROTEIN: 7.7 g/dL (ref 6.5–8.1)

## 2018-05-01 LAB — GLUCOSE, CAPILLARY: GLUCOSE-CAPILLARY: 149 mg/dL — AB (ref 65–99)

## 2018-05-01 LAB — TROPONIN I
Troponin I: 0.08 ng/mL (ref <0.03)
Troponin I: 0.06 ng/mL (ref <0.03)

## 2018-05-01 LAB — MRSA PCR SCREENING: MRSA by PCR: POSITIVE — AB

## 2018-05-01 MED ORDER — LATANOPROST 0.005 % OP SOLN
1.0000 [drp] | Freq: Every day | OPHTHALMIC | Status: DC
  Administered 2018-05-04: 1 [drp] via OPHTHALMIC
  Filled 2018-05-01: qty 2.5

## 2018-05-01 MED ORDER — LOPERAMIDE HCL 2 MG PO CAPS
2.0000 mg | ORAL_CAPSULE | Freq: Four times a day (QID) | ORAL | Status: DC | PRN
Start: 2018-05-01 — End: 2018-05-06

## 2018-05-01 MED ORDER — ONDANSETRON HCL 4 MG/2ML IJ SOLN
4.0000 mg | Freq: Once | INTRAMUSCULAR | Status: AC
  Administered 2018-05-01: 4 mg via INTRAVENOUS
  Filled 2018-05-01: qty 2

## 2018-05-01 MED ORDER — ONDANSETRON HCL 4 MG PO TABS: 4.0000 mg | ORAL_TABLET | Freq: Four times a day (QID) | ORAL | Status: DC | PRN

## 2018-05-01 MED ORDER — GUAIFENESIN ER 600 MG PO TB12: 600.0000 mg | ORAL_TABLET | Freq: Two times a day (BID) | ORAL | Status: DC | PRN

## 2018-05-01 MED ORDER — ASPIRIN EC 81 MG PO TBEC
81.0000 mg | DELAYED_RELEASE_TABLET | Freq: Every day | ORAL | Status: DC
  Administered 2018-05-01 – 2018-05-05 (×5): 81 mg via ORAL
  Filled 2018-05-01 (×5): qty 1

## 2018-05-01 MED ORDER — ATORVASTATIN CALCIUM 20 MG PO TABS
80.0000 mg | ORAL_TABLET | Freq: Every evening | ORAL | Status: DC
  Administered 2018-05-02: 80 mg via ORAL
  Filled 2018-05-01: qty 4

## 2018-05-01 MED ORDER — DM-GUAIFENESIN ER 30-600 MG PO TB12: 1.0000 | ORAL_TABLET | Freq: Two times a day (BID) | ORAL | Status: DC | PRN

## 2018-05-01 MED ORDER — HEPARIN SODIUM (PORCINE) 5000 UNIT/ML IJ SOLN
5000.0000 [IU] | Freq: Three times a day (TID) | INTRAMUSCULAR | Status: DC
  Administered 2018-05-02: 5000 [IU] via SUBCUTANEOUS
  Filled 2018-05-01 (×7): qty 1

## 2018-05-01 MED ORDER — IPRATROPIUM-ALBUTEROL 0.5-2.5 (3) MG/3ML IN SOLN
3.0000 mL | Freq: Four times a day (QID) | RESPIRATORY_TRACT | Status: DC
  Administered 2018-05-01 – 2018-05-05 (×14): 3 mL via RESPIRATORY_TRACT
  Filled 2018-05-01 (×15): qty 3

## 2018-05-01 MED ORDER — DOCUSATE SODIUM 100 MG PO CAPS
100.0000 mg | ORAL_CAPSULE | Freq: Two times a day (BID) | ORAL | Status: DC
  Administered 2018-05-02 – 2018-05-03 (×3): 100 mg via ORAL
  Filled 2018-05-01 (×10): qty 1

## 2018-05-01 MED ORDER — FERROUS SULFATE 325 (65 FE) MG PO TABS
325.0000 mg | ORAL_TABLET | Freq: Every day | ORAL | Status: DC
  Administered 2018-05-01 – 2018-05-05 (×5): 325 mg via ORAL
  Filled 2018-05-01 (×5): qty 1

## 2018-05-01 MED ORDER — ONDANSETRON HCL 4 MG PO TABS: 4.0000 mg | ORAL_TABLET | Freq: Three times a day (TID) | ORAL | Status: DC | PRN

## 2018-05-01 MED ORDER — CHLORHEXIDINE GLUCONATE CLOTH 2 % EX PADS
6.0000 | MEDICATED_PAD | Freq: Every day | CUTANEOUS | Status: DC
  Administered 2018-05-02 – 2018-05-05 (×4): 6 via TOPICAL

## 2018-05-01 MED ORDER — DEXTROMETHORPHAN POLISTIREX ER 30 MG/5ML PO SUER
30.0000 mg | Freq: Two times a day (BID) | ORAL | Status: DC | PRN
Start: 2018-05-01 — End: 2018-05-06
  Filled 2018-05-01: qty 5

## 2018-05-01 MED ORDER — MIDODRINE HCL 5 MG PO TABS
5.0000 mg | ORAL_TABLET | Freq: Three times a day (TID) | ORAL | Status: DC
  Administered 2018-05-02 – 2018-05-05 (×8): 5 mg via ORAL
  Filled 2018-05-01 (×12): qty 1

## 2018-05-01 MED ORDER — ENOXAPARIN SODIUM 40 MG/0.4ML ~~LOC~~ SOLN: 40.0000 mg | SUBCUTANEOUS | Status: DC

## 2018-05-01 MED ORDER — LORATADINE 10 MG PO TABS
10.0000 mg | ORAL_TABLET | Freq: Every day | ORAL | Status: DC
  Administered 2018-05-02 – 2018-05-06 (×5): 10 mg via ORAL
  Filled 2018-05-01 (×5): qty 1

## 2018-05-01 MED ORDER — IPRATROPIUM-ALBUTEROL 20-100 MCG/ACT IN AERS: 2.0000 | INHALATION_SPRAY | Freq: Four times a day (QID) | RESPIRATORY_TRACT | Status: DC | PRN

## 2018-05-01 MED ORDER — DORZOLAMIDE HCL-TIMOLOL MAL 2-0.5 % OP SOLN
1.0000 [drp] | Freq: Two times a day (BID) | OPHTHALMIC | Status: DC
  Administered 2018-05-02 – 2018-05-06 (×7): 1 [drp] via OPHTHALMIC
  Filled 2018-05-01: qty 10

## 2018-05-01 MED ORDER — ACETAMINOPHEN 325 MG PO TABS
650.0000 mg | ORAL_TABLET | Freq: Four times a day (QID) | ORAL | Status: DC | PRN
  Administered 2018-05-05: 650 mg via ORAL
  Filled 2018-05-01: qty 2

## 2018-05-01 MED ORDER — ACETAMINOPHEN 650 MG RE SUPP: 650.0000 mg | Freq: Four times a day (QID) | RECTAL | Status: DC | PRN

## 2018-05-01 MED ORDER — NITROGLYCERIN 0.4 MG SL SUBL: 0.4000 mg | SUBLINGUAL_TABLET | SUBLINGUAL | Status: DC | PRN

## 2018-05-01 MED ORDER — CALCIUM ACETATE (PHOS BINDER) 667 MG PO CAPS
1334.0000 mg | ORAL_CAPSULE | Freq: Three times a day (TID) | ORAL | Status: DC
  Administered 2018-05-02 – 2018-05-06 (×10): 1334 mg via ORAL
  Filled 2018-05-01 (×14): qty 2

## 2018-05-01 MED ORDER — NOREPINEPHRINE 4 MG/250ML-% IV SOLN
0.0000 ug/min | INTRAVENOUS | Status: DC
  Administered 2018-05-01: 4 ug/min via INTRAVENOUS
  Filled 2018-05-01: qty 250

## 2018-05-01 MED ORDER — HYDROXYZINE HCL 25 MG PO TABS
12.5000 mg | ORAL_TABLET | Freq: Every day | ORAL | Status: DC | PRN
  Administered 2018-05-04: 12.5 mg via ORAL
  Filled 2018-05-01 (×4): qty 1

## 2018-05-01 MED ORDER — DIPHENHYDRAMINE HCL 25 MG PO CAPS
25.0000 mg | ORAL_CAPSULE | ORAL | Status: DC | PRN
  Administered 2018-05-04 – 2018-05-05 (×2): 25 mg via ORAL
  Filled 2018-05-01 (×5): qty 1

## 2018-05-01 MED ORDER — INSULIN ASPART 100 UNIT/ML ~~LOC~~ SOLN
0.0000 [IU] | Freq: Every day | SUBCUTANEOUS | Status: DC
  Administered 2018-05-05: 3 [IU] via SUBCUTANEOUS
  Filled 2018-05-01: qty 1

## 2018-05-01 MED ORDER — ONDANSETRON HCL 4 MG/2ML IJ SOLN
4.0000 mg | Freq: Four times a day (QID) | INTRAMUSCULAR | Status: DC | PRN
  Administered 2018-05-02 – 2018-05-04 (×2): 4 mg via INTRAVENOUS
  Filled 2018-05-01 (×3): qty 2

## 2018-05-01 MED ORDER — SODIUM CHLORIDE 0.9 % IV SOLN
1.0000 g | Freq: Every day | INTRAVENOUS | Status: DC
  Administered 2018-05-01 – 2018-05-04 (×3): 1 g via INTRAVENOUS
  Filled 2018-05-01: qty 1
  Filled 2018-05-01: qty 10
  Filled 2018-05-01: qty 1
  Filled 2018-05-01: qty 10
  Filled 2018-05-01: qty 1

## 2018-05-01 MED ORDER — TICAGRELOR 60 MG PO TABS
60.0000 mg | ORAL_TABLET | Freq: Two times a day (BID) | ORAL | Status: DC
  Administered 2018-05-02 – 2018-05-06 (×8): 60 mg via ORAL
  Filled 2018-05-01 (×11): qty 1

## 2018-05-01 MED ORDER — MUPIROCIN 2 % EX OINT
1.0000 "application " | TOPICAL_OINTMENT | Freq: Two times a day (BID) | CUTANEOUS | Status: DC
  Administered 2018-05-02 – 2018-05-06 (×9): 1 via NASAL
  Filled 2018-05-01 (×2): qty 22

## 2018-05-01 MED ORDER — INSULIN ASPART 100 UNIT/ML ~~LOC~~ SOLN
0.0000 [IU] | Freq: Three times a day (TID) | SUBCUTANEOUS | Status: DC
  Administered 2018-05-02 (×2): 3 [IU] via SUBCUTANEOUS
  Administered 2018-05-02 – 2018-05-03 (×2): 2 [IU] via SUBCUTANEOUS
  Administered 2018-05-04: 3 [IU] via SUBCUTANEOUS
  Administered 2018-05-04: 1 [IU] via SUBCUTANEOUS
  Administered 2018-05-05 (×3): 2 [IU] via SUBCUTANEOUS
  Administered 2018-05-06: 1 [IU] via SUBCUTANEOUS
  Filled 2018-05-01 (×10): qty 1

## 2018-05-01 NOTE — ED Notes (Signed)
Pts blood pressure is hypotensive, MD is aware and orders given. Pt given (3) NS bolus. Will continue to monitor.

## 2018-05-01 NOTE — ED Notes (Signed)
Dialysis called and notified that patient needs deaccessed. Dialysis RN states it will be hours until she can come.

## 2018-05-01 NOTE — ED Triage Notes (Signed)
Pt was at dialysis when she began to feel nauseated and had a syncopal episode. Pt arrived to ED with HD access still in place. Pt denies pain.

## 2018-05-01 NOTE — Progress Notes (Signed)
Pharmacy Lovenox Dosing  67 y.o. female admitted with Nausea . Patient ordered Lovenox 40 mg daily for VTE prophylaxis.   Filed Weights   05/01/18 0930 05/01/18 2000  Weight: 145 lb (65.8 kg) 144 lb 13.5 oz (65.7 kg)    Body mass index is 27.37 kg/m.  Estimated Creatinine Clearance: 10.6 mL/min (A) (by C-G formula based on SCr of 4.47 mg/dL (H)).  Will convert patient to subcutaneous heparin 5000 units tid for ESRD on HD.  Valentina GuChristy, Hale Chalfin D 05/01/2018 8:42 PM

## 2018-05-01 NOTE — H&P (Signed)
[facility] [place] [facility]   PATIENT NAME: Nichole Hurst    MR#:  [id number]  DATE OF BIRTH:  1951/08/09  DATE OF ADMISSION:  05/01/2018  PRIMARY CARE PHYSICIAN: [facility]   REQUESTING/REFERRING PHYSICIAN:   CHIEF COMPLAINT:   Syncope HISTORY OF PRESENT ILLNESS:  Nichole Hurst  is a 67 y.o. female with a known history of end-stage renal disease on hemodialysis got 3 L of fluid taken out today and eventually patient was dizzy and passed out.  Patient has history of diabetes metas, coronary artery disease, COPD, hyperlipidemia and systolic congestive heart failure.  Patient is brought into the emergency department and she has received 750 cc of fluid bolus with no significant improvement in the blood pressure.  During my examination systolic blood pressure is at 76 with a diastolic at 51.  Map at around 55-60.  Patient states her systolic baseline blood pressure is 1 30-1 40 or higher than that.  Initial troponin is slightly elevated but patient denies any chest pain or shortness of breath.  Sister at bedside.  Chest x-ray negative.  Reporting cough for 1 week which is not getting better.  No fevers  PAST MEDICAL HISTORY:   Past Medical History:  Diagnosis Date  . Anemia of chronic disease   . CAD (coronary artery disease)    a. s/p 2-V CABG 05/2016 (LIMA-LAD, VG-OM1)  . Carotid stenosis   . Charcot's gait    FOOT  . Claustrophobia   . Colon polyp   . Diabetes mellitus with complication (HCC)   . Emphysema/COPD (HCC)   . ESRD (end stage renal disease) on dialysis (HCC)    DIAYLISIS M/W/F  . Fractures    Lt foot  . GERD (gastroesophageal reflux disease)   . Glaucoma   . History of kidney stones   . Hyperlipidemia   . Hypertension   . Migraine   . Orthopnea   . Oxygen deficiency    AS NEEDED  . PAD (peripheral artery disease) (HCC)   . Pain    JOINTS  . Presence of permanent cardiac pacemaker    [employer]   . Pulmonary  hypertension (HCC)    a. TTE 10/18: EF 55-60%, no RWMA, Gr2DD, mild to moderate MR, mildly dilated left atrium, mildly dilated RV with normal RVSF, mildly dilated RA, severe TR, PASP 60-65 mmHg  . Retinopathy    DIABETIC  . Stroke [facility]) 09/2015   Ischemic stroke with R sided hemiparesis    PAST SURGICAL HISTOIRY:   Past Surgical History:  Procedure Laterality Date  . ABDOMINAL HYSTERECTOMY    . AMPUTATION TOE Left 11/29/2017   Procedure: AMPUTATION TOE-LEFT GREAT TOE;  Surgeon: Linus Galas, DPM;  Location: [facility] ORS;  Service: Podiatry;  Laterality: Left;  . AV FISTULA PLACEMENT     LEFT ARM  . CATARACT EXTRACTION W/PHACO Right 08/25/2016   Procedure: CATARACT EXTRACTION PHACO AND INTRAOCULAR LENS PLACEMENT (IOC) AND ANTERIOR VITRECTOMY;  Surgeon: Sallee Lange, MD;  Location: [facility] ORS;  Service: Ophthalmology;  Laterality: Right;  FLUID CASSETTE [id number] H, EXP 01/05/18  . CHOLECYSTECTOMY    . CORONARY ARTERY BYPASS GRAFT     05/13/2016  . INSERT / REPLACE / REMOVE PACEMAKER     6/17  . INSERTION OF AHMED VALVE Left 08/04/2017   Procedure: INSERTION OF AHMED VALVE;  Surgeon: Lockie Mola, MD;  Location: [facility] ORS;  Service: Ophthalmology;  Laterality: Left;  . LOWER EXTREMITY ANGIOGRAPHY Left 06/13/2017  Procedure: Lower Extremity Angiography;  Surgeon: Annice Needy, MD;  Location: [facility] INVASIVE CV LAB;  Service: Cardiovascular;  Laterality: Left;  . PACEMAKER IMPLANT    . PERIPHERAL VASCULAR CATHETERIZATION Left 04/08/2016   Procedure: A/V Shuntogram/Fistulagram;  Surgeon: Annice Needy, MD;  Location: [facility] INVASIVE CV LAB;  Service: Cardiovascular;  Laterality: Left;  . PERIPHERAL VASCULAR CATHETERIZATION N/A 04/08/2016   Procedure: A/V Shunt Intervention;  Surgeon: Annice Needy, MD;  Location: [facility] INVASIVE CV LAB;  Service: Cardiovascular;  Laterality: N/A;  . RIGHT/LEFT HEART CATH AND CORONARY ANGIOGRAPHY N/A 02/14/2018   Procedure: RIGHT/LEFT HEART CATH AND CORONARY/graft   ANGIOGRAPHY;  Surgeon: Yvonne Kendall, MD;  Location: [facility] INVASIVE CV LAB;  Service: Cardiovascular;  Laterality: N/A;  . STONES     KIDNEY  . TONSILLECTOMY    . TUBAL LIGATION      SOCIAL HISTORY:   Social History   Tobacco Use  . Smoking status: Former Smoker    Last attempt to quit: 06/10/1983    Years since quitting: 34.9  . Smokeless tobacco: Former Engineer, water Use Topics  . Alcohol use: No    FAMILY HISTORY:   Family History  Problem Relation Age of Onset  . Diabetes Mother   . Hypertension Mother   . Heart attack Mother     DRUG ALLERGIES:   Allergies  Allergen Reactions  . Colchicine Anaphylaxis  . Clopidogrel     REVIEW OF SYSTEMS:  CONSTITUTIONAL: No fever, fatigue or weakness.  Patient is feeling very dizzy EYES: No blurred or double vision.  EARS, NOSE, AND THROAT: No tinnitus or ear pain.  RESPIRATORY: No cough, shortness of breath, wheezing or hemoptysis.  CARDIOVASCULAR: No chest pain, orthopnea, edema.  GASTROINTESTINAL: No nausea, vomiting, diarrhea or abdominal pain.  GENITOURINARY: No dysuria, hematuria.  ENDOCRINE: No polyuria, nocturia,  HEMATOLOGY: No anemia, easy bruising or bleeding SKIN: No rash or lesion. MUSCULOSKELETAL: No joint pain or arthritis.   NEUROLOGIC: No tingling, numbness, weakness.  PSYCHIATRY: No anxiety or depression.   MEDICATIONS AT HOME:   Prior to Admission medications   Medication Sig Start Date End Date Taking? Authorizing Provider  aspirin EC 81 MG tablet Take 81 mg by mouth at bedtime.   Yes [provider]  atorvastatin (LIPITOR) 80 MG tablet Take 80 mg by mouth every evening.   Yes [provider]  calcium acetate (PHOSLO) 667 MG capsule Take 1,334 mg by mouth 3 (three) times daily with meals.   Yes [provider]  camphor-menthol Wynelle Fanny) lotion Apply 1 application topically 3 (three) times daily as needed for itching.   Yes [provider]  cholecalciferol  (VITAMIN D) 1000 units tablet Take 1,000 Units by mouth daily.   Yes [provider]  dextromethorphan-guaiFENesin (MUCINEX DM) 30-600 MG 12hr tablet Take 1 tablet by mouth 2 (two) times daily as needed for cough.   Yes [provider]  diphenhydrAMINE (BENADRYL) 25 mg capsule Take 25 mg by mouth every 4 (four) hours as needed for itching.    Yes [provider]  dorzolamide-timolol (COSOPT) 22.3-6.8 MG/ML ophthalmic solution Place 1 drop into the left eye 2 (two) times daily.   Yes [provider]  Ferrous Sulfate 140 (45 Fe) MG TBCR Take 1 tablet by mouth at bedtime.    Yes [provider]  hydrOXYzine (ATARAX/VISTARIL) 25 MG tablet Take 12.5 mg by mouth daily as needed for anxiety.   Yes [provider]  Ipratropium-Albuterol (COMBIVENT) 20-100 MCG/ACT  AERS respimat Inhale 2 puffs into the lungs every 6 (six) hours as needed for wheezing or shortness of breath.    Yes [provider]  ipratropium-albuterol (DUONEB) 0.5-2.5 (3) MG/3ML SOLN Take 3 mLs by nebulization. Every 4 to 6 hours as needed for shortness of breath   Yes [provider]  isosorbide mononitrate (IMDUR) 30 MG 24 hr tablet Take 0.5 tablets (15 mg total) by mouth daily. 02/17/18  Yes Pyreddy, Vivien Rota, MD  latanoprost (XALATAN) 0.005 % ophthalmic solution Place 1 drop into the right eye at bedtime.   Yes [provider]  loperamide (IMODIUM) 2 MG capsule Take 2 mg by mouth 4 (four) times daily as needed for diarrhea or loose stools.    Yes [provider]  loratadine (CLARITIN) 10 MG tablet Take 10 mg by mouth daily. 04/25/18 05/02/18 Yes [provider]  losartan (COZAAR) 50 MG tablet Take 50 mg by mouth at bedtime.    Yes [provider]  metoprolol succinate (TOPROL-XL) 50 MG 24 hr tablet Take 1 tablet (50 mg total) by mouth 2 (two) times daily. Take with or immediately following a meal. Patient taking differently: Take 25 mg by  mouth daily. Take with or immediately following a meal. 02/17/18  Yes Pyreddy, Vivien Rota, MD  nitroGLYCERIN (NITROSTAT) 0.4 MG SL tablet Place 0.4 mg under the tongue every 5 (five) minutes as needed for chest pain.   Yes [provider]  ondansetron (ZOFRAN) 4 MG tablet Take 4 mg by mouth every 8 (eight) hours as needed for nausea or vomiting.   Yes [provider]  ticagrelor (BRILINTA) 60 MG TABS tablet Take 60 mg by mouth 2 (two) times daily.    Yes [provider]  torsemide (DEMADEX) 100 MG tablet Take 1 tablet (100 mg total) by mouth daily. Patient to take medication only on non dialysis days 02/17/18  Yes Pyreddy, Vivien Rota, MD  irbesartan (AVAPRO) 150 MG tablet Take 1 tablet (150 mg total) by mouth at bedtime. Patient not taking: Reported on 05/01/2018 08/09/17   Enedina Finner, MD  predniSONE (DELTASONE) 10 MG tablet Take 1 tablet (10 mg total) by mouth daily with breakfast. Label  & dispense according to the schedule below.  6 tablets day one, then 5 table day 2, then 4 tablets day 3, then 3 tablets day 4, 2 tablets day 5, then 1 tablet day 6, then stop Patient not taking: Reported on 05/01/2018 02/18/18   Ihor Austin, MD      VITAL SIGNS:  Blood pressure (!) 76/51, pulse 73, resp. rate 20, height 5' 1.42" (1.56 m), weight 65.8 kg (145 lb), SpO2 96 %.  PHYSICAL EXAMINATION:  GENERAL:  66 y.o.-year-old patient lying in the bed with no acute distress.  EYES: Pupils equal, round, reactive to light and accommodation. No scleral icterus. Extraocular muscles intact.  HEENT: Head atraumatic, normocephalic. Oropharynx and nasopharynx clear.  NECK:  Supple, no jugular venous distention. No thyroid enlargement, no tenderness.  LUNGS: Normal breath sounds bilaterally, no wheezing, rales,rhonchi or crepitation. No use of accessory muscles of respiration.  CARDIOVASCULAR: S1, S2 normal. No murmurs, rubs, or gallops.  ABDOMEN: Soft, nontender, nondistended. Bowel sounds present. No  organomegaly or mass.  EXTREMITIES: No pedal edema, cyanosis, or clubbing.  NEUROLOGIC: Cranial nerves II through XII are intact. Muscle strength 5/5 in all extremities. Sensation intact. Gait not checked.  PSYCHIATRIC: The patient is alert and oriented x 3.  SKIN: No obvious rash, lesion, or ulcer.   LABORATORY  PANEL:   CBC Recent Labs  Lab 05/01/18 1015  WBC 8.9  HGB 11.3*  HCT 36.0  PLT 292   ------------------------------------------------------------------------------------------------------------------  Chemistries  Recent Labs  Lab 05/01/18 1015  NA 134*  K 4.1  CL 92*  CO2 27  GLUCOSE 241*  BUN 21*  CREATININE 4.47*  CALCIUM 8.9  AST 18  ALT 12*  ALKPHOS 85  BILITOT 1.0   ------------------------------------------------------------------------------------------------------------------  Cardiac Enzymes Recent Labs  Lab 05/01/18 1215  TROPONINI 0.06*   ------------------------------------------------------------------------------------------------------------------  RADIOLOGY:  Dg Chest Portable 1 View  Result Date: 05/01/2018 CLINICAL DATA:  Syncope while at dialysis. EXAM: PORTABLE CHEST 1 VIEW COMPARISON:  02/15/2018 FINDINGS: A dual lead pacemaker remains in place. The cardiac silhouette is borderline enlarged. Aortic atherosclerosis is noted. No airspace consolidation, edema, pleural effusion, or pneumothorax is identified. No acute osseous abnormality is seen. IMPRESSION: No active disease. Electronically Signed   By: Sebastian AcheAllen  Grady M.D.   On: 05/01/2018 09:51   [place] Abdomen Limited Ruq  Result Date: 05/01/2018 CLINICAL DATA:  Right upper quadrant pain. Nausea and vomiting. Cholecystectomy. EXAM: ULTRASOUND ABDOMEN LIMITED RIGHT UPPER QUADRANT COMPARISON:  Ultrasound dated 11/10/2017 and abdominal CT scan dated 01/01/2016 FINDINGS: Gallbladder: Gallbladder has been removed.  Negative sonographic Murphy's sign. Common bile duct: Diameter: 1.8 mm, normal.  Liver: Echogenic foci in the liver consistent with vascular calcifications demonstrated on the prior exam and on the prior CT scan. No mass lesions. Portal vein is patent on color Doppler imaging with normal direction of blood flow towards the liver. IMPRESSION: No significant abnormality.  No change since the prior study. Electronically Signed   By: Francene BoyersJames  Maxwell M.D.   On: 05/01/2018 13:48    EKG:   Orders placed or performed during the hospital encounter of 05/01/18  . ED EKG  . ED EKG  . EKG 12-Lead  . EKG 12-Lead  . EKG 12-Lead  . EKG 12-Lead    IMPRESSION AND PLAN:  Sheppard EvensGloria Delaughter  is a 67 y.o. female with a known history of end-stage renal disease on hemodialysis got 3 L of fluid taken out today and eventually patient was dizzy and passed out.  Patient has history of diabetes metas, coronary artery disease, COPD, hyperlipidemia and systolic congestive heart failure.  Patient is brought into the emergency department and she has received 750 cc of fluid bolus with no significant improvement in the blood pressure.  During my examination systolic blood pressure is at 76 with a diastolic at 51.  Map at around 55-60.    #syncope with severe hypotension Admit to stepdown unit Status post hemodialysis on 3 L of fluid was taken out 750 cc fluid bolus was given in the emergency department still patient's map is at 55-60 Stepdown unit for IV pressors as needed Orthostatics Cycle troponins  #Acute bronchitis sputum culture and sensitivity and IV Rocephin  #End-stage renal disease on hemodialysis Nephrology consult placed  #Elevated troponin but patient is denying any chest pain Mildly elevated troponin in the setting of end-stage renal disease, patient denies any chest pain we will continue close monitoring  #Diabetes mellitus provide sliding scale insulin  #Chronic COPD no exacerbation Nebulizer treatments  #Chronic systolic congestive heart failure with recent ejection fraction 30  to 35% Patient is status post hemodialysis today not fluid overloaded in fact she is hypotensive continue close monitoring #essential hypertension hold all blood pressure medications in view of severe hypotension and syncopal episode   DVT prophylaxis with Lovenox subcu  All  the records are reviewed and case discussed with ED provider. Management plans discussed with the patient, family and they are in agreement.  CODE STATUS: fc , daughter Kennyth Arnold and Sister Administrator, [employer] POA  TOTAL CRITICAL CARE TIME TAKING CARE OF THIS PATIENT: 45 minutes.   Note: This dictation was prepared with Dragon dictation along with smaller phrase technology. Any transcriptional errors that result from this process are unintentional.  Ramonita Lab M.D on 05/01/2018 at 3:55 PM  Between 7am to 6pm - Pager - [phone]  After 6pm go to [email or web address] - password [email or web address] [facility]  [facility]  Office  [phone]  CC: Primary care physician; [facility] [employer]

## 2018-05-01 NOTE — ED Provider Notes (Signed)
[facility] Emergency Department Provider Note   ____________________________________________   First MD Initiated Contact with Patient 05/01/18 0932     (approximate)  I have reviewed the triage vital signs and the nursing notes.   HISTORY  Chief Complaint Nausea    HPI Nichole Hurst is a 67 y.o. female who was at dialysis and got within 36 minutes of completing dialysis.  She had to go to the bathroom went to the bathroom had a stool came back and passed out.  She reports she feels nauseated now.  She denies any chest pain tightness or shortness of breath or any other complaints.  She has some mild continuing nausea.  Past Medical History:  Diagnosis Date  . Anemia of chronic disease   . CAD (coronary artery disease)    a. s/p 2-V CABG 05/2016 (LIMA-LAD, VG-OM1)  . Carotid stenosis   . Charcot's gait    FOOT  . Claustrophobia   . Colon polyp   . Diabetes mellitus with complication (HCC)   . Emphysema/COPD (HCC)   . ESRD (end stage renal disease) on dialysis (HCC)    DIAYLISIS M/W/F  . Fractures    Lt foot  . GERD (gastroesophageal reflux disease)   . Glaucoma   . History of kidney stones   . Hyperlipidemia   . Hypertension   . Migraine   . Orthopnea   . Oxygen deficiency    AS NEEDED  . PAD (peripheral artery disease) (HCC)   . Pain    JOINTS  . Presence of permanent cardiac pacemaker    St. Jude   . Pulmonary hypertension (HCC)    a. TTE 10/18: EF 55-60%, no RWMA, Gr2DD, mild to moderate MR, mildly dilated left atrium, mildly dilated RV with normal RVSF, mildly dilated RA, severe TR, PASP 60-65 mmHg  . Retinopathy    DIABETIC  . Stroke [facility]) 09/2015   Ischemic stroke with R sided hemiparesis    Patient Active Problem List   Diagnosis Date Noted  . Acute systolic heart failure (HCC)   . Non-ST elevation (NSTEMI) myocardial infarction (HCC)   . COPD exacerbation (HCC) 02/11/2018  . PAD (peripheral artery disease) (HCC)  01/31/2018  . Gangrene of toe of left foot (HCC) 11/28/2017  . Gangrene (HCC) 11/28/2017  . Pulmonary edema 08/08/2017  . Atherosclerosis of native arteries of the extremities with gangrene (HCC) 06/08/2017  . CHF (congestive heart failure) (HCC) 10/30/2015  . History of stroke with residual deficit 10/30/2015  . Hyperlipidemia 10/30/2015  . Cerebrovascular accident (CVA) (HCC) 09/09/2015  . CAD in native artery 08/29/2015  . Carotid artery narrowing 11/26/2014  . Anemia of chronic disease 09/03/2014  . End stage renal failure on dialysis (HCC) 09/03/2014  . Chest pain 07/12/2014  . Breath shortness 07/12/2014  . Type 2 diabetes mellitus with end-stage renal disease (HCC) 03/17/2014  . BP (high blood pressure) 03/17/2014    Past Surgical History:  Procedure Laterality Date  . ABDOMINAL HYSTERECTOMY    . AMPUTATION TOE Left 11/29/2017   Procedure: AMPUTATION TOE-LEFT GREAT TOE;  Surgeon: Linus Galas, DPM;  Location: [facility] ORS;  Service: Podiatry;  Laterality: Left;  . AV FISTULA PLACEMENT     LEFT ARM  . CATARACT EXTRACTION W/PHACO Right 08/25/2016   Procedure: CATARACT EXTRACTION PHACO AND INTRAOCULAR LENS PLACEMENT (IOC) AND ANTERIOR VITRECTOMY;  Surgeon: Sallee Lange, MD;  Location: [facility] ORS;  Service: Ophthalmology;  Laterality: Right;  FLUID CASSETTE [id number] H, EXP 01/05/18  . CHOLECYSTECTOMY    .  CORONARY ARTERY BYPASS GRAFT     05/13/2016  . INSERT / REPLACE / REMOVE PACEMAKER     6/17  . INSERTION OF AHMED VALVE Left 08/04/2017   Procedure: INSERTION OF AHMED VALVE;  Surgeon: Lockie Mola, MD;  Location: [facility] ORS;  Service: Ophthalmology;  Laterality: Left;  . LOWER EXTREMITY ANGIOGRAPHY Left 06/13/2017   Procedure: Lower Extremity Angiography;  Surgeon: Annice Needy, MD;  Location: [facility] INVASIVE CV LAB;  Service: Cardiovascular;  Laterality: Left;  . PACEMAKER IMPLANT    . PERIPHERAL VASCULAR CATHETERIZATION Left 04/08/2016   Procedure: A/V Shuntogram/Fistulagram;   Surgeon: Annice Needy, MD;  Location: [facility] INVASIVE CV LAB;  Service: Cardiovascular;  Laterality: Left;  . PERIPHERAL VASCULAR CATHETERIZATION N/A 04/08/2016   Procedure: A/V Shunt Intervention;  Surgeon: Annice Needy, MD;  Location: [facility] INVASIVE CV LAB;  Service: Cardiovascular;  Laterality: N/A;  . RIGHT/LEFT HEART CATH AND CORONARY ANGIOGRAPHY N/A 02/14/2018   Procedure: RIGHT/LEFT HEART CATH AND CORONARY/graft  ANGIOGRAPHY;  Surgeon: Yvonne Kendall, MD;  Location: [facility] INVASIVE CV LAB;  Service: Cardiovascular;  Laterality: N/A;  . STONES     KIDNEY  . TONSILLECTOMY    . TUBAL LIGATION      Prior to Admission medications   Medication Sig Start Date End Date Taking? Authorizing Provider  aspirin EC 81 MG tablet Take 81 mg by mouth at bedtime.    [provider]  atorvastatin (LIPITOR) 80 MG tablet Take 80 mg by mouth every evening.    [provider]  bimatoprost (LUMIGAN) 0.03 % ophthalmic solution Place 1 drop into the right eye at bedtime.     [provider]  Brinzolamide-Brimonidine 1-0.2 % SUSP Apply 1 drop to eye 3 (three) times daily. LEFT EYE    [provider]  calcium acetate (PHOSLO) 667 MG capsule Take 1,334 mg by mouth 3 (three) times daily with meals.    [provider]  diclofenac sodium (VOLTAREN) 1 % GEL Apply topically 4 (four) times daily.    [provider]  Difluprednate (DUREZOL) 0.05 % EMUL Place 1 drop into the left eye daily.     [provider]  diphenhydrAMINE (BENADRYL) 25 mg capsule Take 25 mg by mouth every 4 (four) hours as needed.    [provider]  Ferrous Sulfate 140 (45 Fe) MG TBCR Take 1 tablet by mouth daily.    [provider]  Fluticasone Propionate (FLONASE NA) Place 2 puffs into the nose 2 (two) times daily as needed.     [provider]  Ipratropium-Albuterol (COMBIVENT) 20-100 MCG/ACT AERS respimat Inhale 2 puffs into the lungs every 6 (six) hours.     [provider]  irbesartan (AVAPRO) 150 MG tablet Take 1 tablet (150 mg total) by mouth at bedtime. 08/09/17   Enedina Finner, MD  isosorbide mononitrate (IMDUR) 30 MG 24 hr tablet Take 0.5 tablets (15 mg total) by mouth daily. 02/17/18   Ihor Austin, MD  loperamide (IMODIUM) 2 MG capsule Take by mouth as needed for diarrhea or loose stools.    [provider]  losartan (COZAAR) 100 MG tablet Take 100 mg by mouth at bedtime.    [provider]  metoprolol succinate (TOPROL-XL) 50 MG 24 hr tablet Take 1 tablet (50 mg total) by mouth 2 (two) times daily. Take with or immediately following a meal. 02/17/18   Pyreddy, Vivien Rota, MD  moxifloxacin (VIGAMOX) 0.5 % ophthalmic solution Place 1 drop into the left eye 4 (four)  times daily.    [provider]  nitroGLYCERIN (NITROSTAT) 0.4 MG SL tablet Place 0.4 mg under the tongue every 5 (five) minutes as needed for chest pain.    [provider]  ondansetron (ZOFRAN) 4 MG tablet Take 4 mg by mouth every 8 (eight) hours as needed for nausea or vomiting.    [provider]  predniSONE (DELTASONE) 10 MG tablet Take 1 tablet (10 mg total) by mouth daily with breakfast. Label  & dispense according to the schedule below.  6 tablets day one, then 5 table day 2, then 4 tablets day 3, then 3 tablets day 4, 2 tablets day 5, then 1 tablet day 6, then stop 02/18/18   Ihor Austin, MD  ticagrelor (BRILINTA) 60 MG TABS tablet Take 60 mg by mouth 2 (two) times daily.     [provider]  timolol (BETIMOL) 0.5 % ophthalmic solution Place 1 drop into the left eye 2 (two) times daily.     [provider]  torsemide (DEMADEX) 100 MG tablet Take 1 tablet (100 mg total) by mouth daily. Patient to take medication only on non dialysis days 02/17/18   Ihor Austin, MD  Vitamin D, Ergocalciferol, (DRISDOL) 50000 units CAPS capsule Take 50,000 Units by mouth every 30 (thirty) days.    [provider]     Allergies Colchicine and Clopidogrel  Family History  Problem Relation Age of Onset  . Diabetes Mother   . Hypertension Mother   . Heart attack Mother     Social History Social History   Tobacco Use  . Smoking status: Former Smoker    Last attempt to quit: 06/10/1983    Years since quitting: 34.9  . Smokeless tobacco: Former Engineer, water Use Topics  . Alcohol use: No  . Drug use: No    Review of Systems  Constitutional: No fever/chills Eyes: No visual changes. ENT: No sore throat. Cardiovascular: Denies chest pain. Respiratory: Denies shortness of breath. Gastrointestinal: No abdominal pain.  nausea, no vomiting.  No diarrhea.  No constipation. Genitourinary: Negative for dysuria. Musculoskeletal: Negative for back pain. Skin: Negative for rash. Neurological: Negative for headaches, focal weakness   ____________________________________________   PHYSICAL EXAM:  VITAL SIGNS: ED Triage Vitals  Enc Vitals Group     BP 05/01/18 0933 112/88     Pulse Rate 05/01/18 0933 73     Resp 05/01/18 0933 16     Temp --      Temp src --      SpO2 05/01/18 0933 96 %     Weight 05/01/18 0930 145 lb (65.8 kg)     Height 05/01/18 0930 5' 1.42" (1.56 m)     Head Circumference --      Peak Flow --      Pain Score 05/01/18 0930 0     Pain Loc --      Pain Edu? --      Excl. in GC? --     Constitutional: Alert and oriented. Well appearing and in no acute distress. Eyes: Conjunctivae are normal. PER. EOMI. Head: Atraumatic. Nose: No congestion/rhinnorhea. Mouth/Throat: Mucous membranes are moist.  Oropharynx non-erythematous. Neck: No stridor.  Cardiovascular: Normal rate, regular rhythm. Grossly normal heart sounds.  Good peripheral circulation. Respiratory: Normal respiratory effort.  No retractions. Lungs CTAB. Gastrointestinal: Soft and nontender. No distention. No abdominal bruits. No CVA tenderness. Musculoskeletal: No lower extremity tenderness nor edema.   Left lower leg is in a walking boot. Neurologic:  Normal speech and language. No gross focal neurologic deficits are appreciated.  Skin:  Skin is warm, dry and intact. No rash noted. Psychiatric: Mood and affect are normal. Speech and behavior are normal.  ____________________________________________   LABS (all labs ordered are listed, but only abnormal results are displayed)  Labs Reviewed  COMPREHENSIVE METABOLIC PANEL - Abnormal; Notable for the following components:      Result Value   Sodium 134 (*)    Chloride 92 (*)    Glucose, Bld 241 (*)    BUN 21 (*)    Creatinine, Ser 4.47 (*)    ALT 12 (*)    GFR calc non Af Amer 9 (*)    GFR calc Af Amer 11 (*)    All other components within normal limits  TROPONIN I - Abnormal; Notable for the following components:   Troponin I 0.08 (*)    All other components within normal limits  CBC WITH DIFFERENTIAL/PLATELET - Abnormal; Notable for the following components:   Hemoglobin 11.3 (*)    MCHC 31.5 (*)    RDW 20.4 (*)    Neutro Abs 7.8 (*)    Lymphs Abs 0.6 (*)    All other components within normal limits  TROPONIN I - Abnormal; Notable for the following components:   Troponin I 0.06 (*)    All other components within normal limits   ____________________________________________  EKG  KG read and interpreted by me shows normal sinus rhythm rate of 73 normal axis left bundle branch block no acute changes when compared to multiple old EKGs EKG #2 read and interpreted by me shows normal sinus rhythm rate of 76 left bundle branch block looks very similar to EKG #1 ____________________________________________  RADIOLOGY  ED MD interpretation: Chest x-ray read by radiology reviewed by me is no active disease.  Official radiology report(s): Dg Chest Portable 1 View  Result Date: 05/01/2018 CLINICAL DATA:  Syncope while at dialysis. EXAM: PORTABLE CHEST 1 VIEW COMPARISON:  02/15/2018 FINDINGS: A dual lead pacemaker remains in  place. The cardiac silhouette is borderline enlarged. Aortic atherosclerosis is noted. No airspace consolidation, edema, pleural effusion, or pneumothorax is identified. No acute osseous abnormality is seen. IMPRESSION: No active disease. Electronically Signed   By: Sebastian AcheAllen  Grady M.D.   On: 05/01/2018 09:51   [place] Abdomen Limited Ruq  Result Date: 05/01/2018 CLINICAL DATA:  Right upper quadrant pain. Nausea and vomiting. Cholecystectomy. EXAM: ULTRASOUND ABDOMEN LIMITED RIGHT UPPER QUADRANT COMPARISON:  Ultrasound dated 11/10/2017 and abdominal CT scan dated 01/01/2016 FINDINGS: Gallbladder: Gallbladder has been removed.  Negative sonographic Murphy's sign. Common bile duct: Diameter: 1.8 mm, normal. Liver: Echogenic foci in the liver consistent with vascular calcifications demonstrated on the prior exam and on the prior CT scan. No mass lesions. Portal vein is patent on color Doppler imaging with normal direction of blood flow towards the liver. IMPRESSION: No significant abnormality.  No change since the prior study. Electronically Signed   By: Francene BoyersJames  Maxwell M.D.   On: 05/01/2018 13:48    ____________________________________________   PROCEDURES  Procedure(s) performed:   Procedures    ____________________________________________   INITIAL IMPRESSION / ASSESSMENT AND PLAN / ED COURSE  ----------------------------------------- 11:28 AM on 05/01/2018 -----------------------------------------  Patient is now feeling better she is no longer nauseated EKG was okay troponin is somewhat elevated we will check another one after a total of 2 hours to see if it is going up or if this is stable.  ----------------------------------------- 12:22 PM on 05/01/2018 -----------------------------------------  Patient's blood pressure has drifted down.  We are repeating troponin and second EKG looks really unchanged patient feels a little lightheaded but no nausea no chest tightness no other  complaints.  She does now complain of having had pain in the right upper abdomen last night.  Her liver functions are okay she is not seeming to be tender there now.  Pain was associated with eating pineapple couple hours previously.  She said the area got tight and hard.  Worse to palpation but otherwise does not seem to be made worse by anything.  Patient is not febrile has not been short of breath.  It was giving her some IV fluids and see if that will improve her blood pressure get the second troponin back and consider a right upper quadrant ultrasound.    ----------------------------------------- 1:03 PM on 05/01/2018 -----------------------------------------  Patient's troponin level dropped slightly.  Blood pressures somewhat better with fluids we will give her a little bit more fluids and try the ultrasound. ----------------------------------------- 2:33 PM on 05/01/2018 -----------------------------------------  Patient's ultrasound is negative but her blood pressure is gone down again gave her a third 250 cc fluid bolus for total of 750 cc pressure went up to 87 systolic and then down to 83 she says she feels like she is getting full of fluid again.  Currently her blood pressure has bottomed out before last week's dialysis.  They decreased some of her medications but her blood pressure is really quite low now.  She has not had any medications here or at dialysis.  I will have her admitted to the hospital to continue treatment and evaluation of her low blood pressure.  ____________________________________________   FINAL CLINICAL IMPRESSION(S) / ED DIAGNOSES  Final diagnoses:  Hypotension, unspecified hypotension type     ED Discharge Orders    None       Note:  This document was prepared using Dragon voice recognition software and may include unintentional dictation errors.    Arnaldo Natal, MD 05/01/18 1434

## 2018-05-01 NOTE — Progress Notes (Signed)
   05/01/18 1230  Clinical Encounter Type  Visited With Patient and family together  Visit Type Initial;Spiritual support  Referral From Chaplain  Consult/Referral To Chaplain  Advance Directives (For Healthcare)  Does Patient Have a Medical Advance Directive? No  Would patient like information on creating a medical advance directive? No - Patient declined  Mental Health Advance Directives  Does Patient Have a Mental Health Advance Directive? No  Would patient like information on creating a mental health advance directive? No - Patient declined  Chaplain knocked on Pt door and introduced herself. Pt said hello come on in. Pt sister was there with her. Chaplain ask Pt how she was feeling and what brought her in today. Pt explain to Chaplain that she past out and Chaplain expressed sympathy to Pt. Pt said thank you. Chaplain acknowledges how it was great to have a sister to be here with you and Pt said yes I applicate her. Chaplain offered prayer and Pt and sister said Yes. Chaplain prayed and asked Pt was there anything else that she could do. Pt said no.

## 2018-05-01 NOTE — ED Notes (Signed)
Nurse Practioner with [facility] called and stated that last week pts B/P bottomed out and they decreased some of her medication. She is going to send an updated version of her med list. If we need to contact her she left her cell [phone]. Also contact her if we are able to discharge her by 5pm and they can provide transport.

## 2018-05-01 NOTE — Consult Note (Signed)
PULMONARY / CRITICAL CARE MEDICINE   Name: Nichole EvensGloria Humiston MRN: [id number] DOB: 06/10/1951    ADMISSION DATE:  05/01/2018   CONSULTATION DATE:  05/01/2018  REFERRING MD: Dr. Amado CoeGouru  Reason: Syncope and hypotension  HISTORY OF PRESENT ILLNESS:   This is a 67 year old female with a medical history as indicated below who presented to the ED with hypotension and a syncopal episode.  Patient was at dialysis when she suddenly felt nauseated and had a syncopal episode.  Incident occurred 36 minutes post dialysis.  She had 3 L of fluid taken out during dialysis.  At the ED, patient was found to be hypotensive.  She was given 750 cc fluid bolus without improvement in blood pressure.  Her ED work-up was unremarkable.  She was started on pressors and admitted to the ICU for further management. Patient is currently awake and and complaining of a mild cough that started a week ago.  She denies chest pain palpitations dizziness and headache.  PAST MEDICAL HISTORY :  She  has a past medical history of Anemia of chronic disease, CAD (coronary artery disease), Carotid stenosis, Charcot's gait, Claustrophobia, Colon polyp, Diabetes mellitus with complication (HCC), Emphysema/COPD (HCC), ESRD (end stage renal disease) on dialysis (HCC), Fractures, GERD (gastroesophageal reflux disease), Glaucoma, History of kidney stones, Hyperlipidemia, Hypertension, Migraine, Orthopnea, Oxygen deficiency, PAD (peripheral artery disease) (HCC), Pain, Presence of permanent cardiac pacemaker, Pulmonary hypertension (HCC), Retinopathy, and Stroke (HCC) (09/2015).  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  Allergies  Allergen Reactions  . Colchicine Anaphylaxis  . Clopidogrel     No current facility-administered medications on file prior to encounter.    Current Outpatient Medications on File Prior to Encounter  Medication Sig  . aspirin EC 81 MG tablet Take 81 mg by mouth at bedtime.  Marland Kitchen. atorvastatin (LIPITOR) 80 MG tablet Take 80 mg by mouth every evening.  . calcium acetate (PHOSLO) 667 MG capsule Take 1,334 mg by mouth 3 (three) times daily with meals.  . camphor-menthol (SARNA) lotion Apply 1 application topically 3 (three) times daily as needed for itching.  . cholecalciferol (VITAMIN D) 1000 units tablet Take 1,000 Units by mouth daily.  Marland Kitchen. dextromethorphan-guaiFENesin (MUCINEX DM) 30-600 MG 12hr tablet Take 1 tablet by mouth 2 (two) times daily as needed for cough.  . diphenhydrAMINE (BENADRYL) 25 mg capsule Take 25 mg by mouth every 4 (four) hours as needed for itching.   . dorzolamide-timolol (COSOPT) 22.3-6.8 MG/ML ophthalmic solution Place 1 drop into the left eye 2 (two) times daily.  . Ferrous Sulfate 140 (45 Fe) MG TBCR Take 1 tablet by mouth at bedtime.   . hydrOXYzine (ATARAX/VISTARIL) 25 MG tablet Take 12.5 mg by mouth daily as needed for anxiety.  . Ipratropium-Albuterol (COMBIVENT) 20-100 MCG/ACT AERS respimat Inhale 2 puffs into the lungs every 6 (six) hours as needed for wheezing or shortness of breath.   Marland Kitchen. ipratropium-albuterol (DUONEB) 0.5-2.5 (3) MG/3ML SOLN Take 3 mLs by nebulization. Every 4 to 6 hours as needed for shortness of breath  . isosorbide mononitrate (IMDUR) 30 MG 24 hr tablet Take 0.5 tablets (15 mg total)  by mouth daily.  Marland Kitchen latanoprost (XALATAN) 0.005 % ophthalmic solution Place 1 drop into the right eye at bedtime.  Marland Kitchen loperamide (IMODIUM) 2 MG capsule Take 2  mg by mouth 4 (four) times daily as needed for diarrhea or loose stools.   Marland Kitchen loratadine (CLARITIN) 10 MG tablet Take 10 mg by mouth daily.  Marland Kitchen losartan (COZAAR) 50 MG tablet Take 50 mg by mouth at bedtime.   . metoprolol succinate (TOPROL-XL) 50 MG 24 hr tablet Take 1 tablet (50 mg total) by mouth 2 (two) times daily. Take with or immediately following a meal. (Patient taking differently: Take 25 mg by mouth daily. Take with or immediately following a meal.)  . nitroGLYCERIN (NITROSTAT) 0.4 MG SL tablet Place 0.4 mg under the tongue every 5 (five) minutes as needed for chest pain.  Marland Kitchen ondansetron (ZOFRAN) 4 MG tablet Take 4 mg by mouth every 8 (eight) hours as needed for nausea or vomiting.  . ticagrelor (BRILINTA) 60 MG TABS tablet Take 60 mg by mouth 2 (two) times daily.   Marland Kitchen torsemide (DEMADEX) 100 MG tablet Take 1 tablet (100 mg total) by mouth daily. Patient to take medication only on non dialysis days  . irbesartan (AVAPRO) 150 MG tablet Take 1 tablet (150 mg total) by mouth at bedtime. (Patient not taking: Reported on 05/01/2018)  . predniSONE (DELTASONE) 10 MG tablet Take 1 tablet (10 mg total) by mouth daily with breakfast. Label  & dispense according to the schedule below.  6 tablets day one, then 5 table day 2, then 4 tablets day 3, then 3 tablets day 4, 2 tablets day 5, then 1 tablet day 6, then stop (Patient not taking: Reported on 05/01/2018)    FAMILY HISTORY:  Her indicated that her mother is deceased.   SOCIAL HISTORY: She  reports that she quit smoking about 34 years ago. She has quit using smokeless tobacco. She reports that she does not drink alcohol or use drugs.  REVIEW OF SYSTEMS:   Constitutional: Negative for fever and chills.  HENT: Negative for congestion and rhinorrhea.  Eyes: Negative for redness and visual disturbance.  Respiratory: Negative for shortness of breath and wheezing but positive for mild cough.  Cardiovascular: Negative for chest pain and palpitations.   Gastrointestinal: Positive for nausea , vomiting but negative abdominal pain and  Loose stools Genitourinary: Negative for dysuria and urgency.  Endocrine: Denies polyuria, polyphagia and heat intolerance Musculoskeletal: Negative for myalgias and arthralgias.  Skin: Negative for pallor and wound.  Neurological: Negative for dizziness and headaches   SUBJECTIVE:   VITAL SIGNS: BP 91/79   Pulse 94   Temp 97.7 F (36.5 C) (Axillary)   Resp (!) 21   Ht 5\' 1"  (1.549 m)   Wt 144 lb 13.5 oz (65.7 kg)   SpO2 100%   BMI 27.37 kg/m   HEMODYNAMICS:    VENTILATOR SETTINGS:    INTAKE / OUTPUT: No intake/output data recorded.  PHYSICAL EXAMINATION: General: No acute distress Neuro: Alert and oriented x3 no focal deficits HEENT: PERRLA, no JVD Cardiovascular: Pulse mildly tachycardic, S1-S2, no murmur regurg or gallop, +2 pulses bilaterally Lungs: Bilateral breath sounds, diminished in the bases, no wheezes or rhonchi Abdomen: Nondistended, normal bowel sounds in all 4 quadrants, palpation reveals no organomegaly Musculoskeletal: Positive range of motion in upper and lower extremities Skin: Warm and dry  LABS:  BMET Recent Labs  Lab 05/01/18 1015  NA 134*  K 4.1  CL 92*  CO2 27  BUN 21*  CREATININE 4.47*  GLUCOSE 241*    Electrolytes Recent Labs  Lab 05/01/18 1015  CALCIUM 8.9    CBC Recent Labs  Lab 05/01/18 1015  WBC 8.9  HGB 11.3*  HCT 36.0  PLT 292    Coag's No results for input(s): APTT, INR in the last 168 hours.  Sepsis Markers No results for input(s): LATICACIDVEN, PROCALCITON, O2SATVEN in the last 168 hours.  ABG No results for input(s): PHART, PCO2ART, PO2ART in the last 168 hours.  Liver Enzymes Recent Labs  Lab 05/01/18 1015  AST 18  ALT 12*  ALKPHOS 85  BILITOT 1.0  ALBUMIN 3.6    Cardiac Enzymes Recent Labs  Lab 05/01/18 1015 05/01/18 1215  TROPONINI 0.08* 0.06*    Glucose Recent Labs  Lab 05/01/18 2016  GLUCAP  149*    Imaging Dg Chest Portable 1 View  Result Date: 05/01/2018 CLINICAL DATA:  Syncope while at dialysis. EXAM: PORTABLE CHEST 1 VIEW COMPARISON:  02/15/2018 FINDINGS: A dual lead pacemaker remains in place. The cardiac silhouette is borderline enlarged. Aortic atherosclerosis is noted. No airspace consolidation, edema, pleural effusion, or pneumothorax is identified. No acute osseous abnormality is seen. IMPRESSION: No active disease. Electronically Signed   By: Sebastian Ache M.D.   On: 05/01/2018 09:51   US Abdomen Limited Ruq  Result Date: 05/01/2018 CLINICAL DATA:  Right upper quadrant pain. Nausea and vomiting. Cholecystectomy. EXAM: ULTRASOUND ABDOMEN LIMITED RIGHT UPPER QUADRANT COMPARISON:  Ultrasound dated 11/10/2017 and abdominal CT scan dated 01/01/2016 FINDINGS: Gallbladder: Gallbladder has been removed.  Negative sonographic Murphy's sign. Common bile duct: Diameter: 1.8 mm, normal. Liver: Echogenic foci in the liver consistent with vascular calcifications demonstrated on the prior exam and on the prior CT scan. No mass lesions. Portal vein is patent on color Doppler imaging with normal direction of blood flow towards the liver. IMPRESSION: No significant abnormality.  No change since the prior study. Electronically Signed   By: Francene Boyers M.D.   On: 05/01/2018 13:48   STUDIES:  Last echo was 02/13/2018: LV EF: 30% -   35%  CULTURES: None  ANTIBIOTICS: Ceftriaxone for?bronchitis  SIGNIFICANT EVENTS: 6/24: Admitted  LINES/TUBES: Peripheral IVs  DISCUSSION: 67 year old female with end-stage renal disease presenting with syncope secondary to post dialysis hypotension  ASSESSMENT Hypotension secondary to volume depletion from dialysis-resolved Syncope due to hypotension End-stage renal disease on hemodialysis Acute bronchitis-questionable as this patient is not actively coughing Type 2 diabetes mellitus Chronic systolic heart failure with reduced EF of 30 to  35% Mildly elevated troponin likely due to demand ischemia from hypotension   PLAN Hemodynamic monitoring per ICU protocol No repeat infusion, titrate to maintain mean arterial blood pressure 65 and above Cycle cardiac enzymes Trend procalcitonin and discontinue antibiotics if normal Monitor and correct electrolyte imbalances Follow-up with nephrology regarding inpatient dialysis Resume all home medications Blood glucose monitoring with sliding scale insulin coverage GI and DVT prophylaxis  FAMILY  - Updates: Patient updated on current treatment plan  - Jossalyn Forgione S. [facility] ANP-BC Pulmonary and Critical Care Medicine [facility] Pager [phone] or [phone]  NB: This document was prepared using Dragon voice recognition software and may include unintentional dictation errors.   05/01/2018, 11:11 PM

## 2018-05-02 DIAGNOSIS — Z992 Dependence on renal dialysis: Secondary | ICD-10-CM

## 2018-05-02 DIAGNOSIS — R740 Nonspecific elevation of levels of transaminase and lactic acid dehydrogenase [LDH]: Secondary | ICD-10-CM

## 2018-05-02 DIAGNOSIS — I429 Cardiomyopathy, unspecified: Secondary | ICD-10-CM

## 2018-05-02 DIAGNOSIS — I248 Other forms of acute ischemic heart disease: Secondary | ICD-10-CM

## 2018-05-02 DIAGNOSIS — N186 End stage renal disease: Secondary | ICD-10-CM

## 2018-05-02 LAB — COMPREHENSIVE METABOLIC PANEL
ALBUMIN: 3.1 g/dL — AB (ref 3.5–5.0)
ALT: 918 U/L — ABNORMAL HIGH (ref 0–44)
AST: 2610 U/L — ABNORMAL HIGH (ref 15–41)
Alkaline Phosphatase: 160 U/L — ABNORMAL HIGH (ref 38–126)
Anion gap: 18 — ABNORMAL HIGH (ref 5–15)
BILIRUBIN TOTAL: 1.2 mg/dL (ref 0.3–1.2)
BUN: 33 mg/dL — AB (ref 8–23)
CO2: 20 mmol/L — AB (ref 22–32)
Calcium: 8.5 mg/dL — ABNORMAL LOW (ref 8.9–10.3)
Chloride: 98 mmol/L (ref 98–111)
Creatinine, Ser: 5.8 mg/dL — ABNORMAL HIGH (ref 0.44–1.00)
GFR calc Af Amer: 8 mL/min — ABNORMAL LOW (ref >60)
GFR calc non Af Amer: 7 mL/min — ABNORMAL LOW (ref >60)
GLUCOSE: 147 mg/dL — AB (ref 70–99)
POTASSIUM: 4.8 mmol/L (ref 3.5–5.1)
SODIUM: 136 mmol/L (ref 135–145)
TOTAL PROTEIN: 6.8 g/dL (ref 6.5–8.1)

## 2018-05-02 LAB — GLUCOSE, CAPILLARY
GLUCOSE-CAPILLARY: 146 mg/dL — AB (ref 70–99)
Glucose-Capillary: 153 mg/dL — ABNORMAL HIGH (ref 70–99)
Glucose-Capillary: 205 mg/dL — ABNORMAL HIGH (ref 70–99)
Glucose-Capillary: 223 mg/dL — ABNORMAL HIGH (ref 70–99)

## 2018-05-02 LAB — CBC
HEMATOCRIT: 37.8 % (ref 35.0–47.0)
Hemoglobin: 11.8 g/dL — ABNORMAL LOW (ref 12.0–16.0)
MCH: 28.1 pg (ref 26.0–34.0)
MCHC: 31.3 g/dL — AB (ref 32.0–36.0)
MCV: 89.9 fL (ref 80.0–100.0)
Platelets: 261 10*3/uL (ref 150–440)
RBC: 4.21 MIL/uL (ref 3.80–5.20)
RDW: 22 % — AB (ref 11.5–14.5)
WBC: 14.3 10*3/uL — ABNORMAL HIGH (ref 3.6–11.0)

## 2018-05-02 LAB — PROCALCITONIN: Procalcitonin: 2.74 ng/mL

## 2018-05-02 LAB — TSH: TSH: 2.453 u[IU]/mL (ref 0.350–4.500)

## 2018-05-02 NOTE — Care Management (Signed)
[facility] is aware of [facility]. RNCM has updated Marchelle FolksAmanda with [facility] also.

## 2018-05-02 NOTE — Progress Notes (Signed)
[facility]  ROUNDING NOTE   Subjective:   Nichole Hurst [facility] 05[facility]01/2018 for RUQ pain [R10.11] Hypotension, unspecified hypotension type [I95.9] Hypotension [I95.9]  Friend at bedside.   Patient completed 3 hours of hemodialysis treatment before having a syncopal episode. She was admitted to ICU and started on norepinphrine. Placed on [place] O2.   Started on midodrine.   Empiric ceftriaxone.   Objective:  Vital signs in last 24 hours:  Temp:  [97.6 F (36.4 C)-97.7 F (36.5 C)] 97.6 F (36.4 C) (06/25 0800) Pulse Rate:  [72-101] 83 (06/25 0600) Resp:  [12-25] 20 (06/25 0800) BP: (65-119)/(49-84) 107/77 (06/25 0800) SpO2:  [95 %-100 %] 100 % (06/25 0800) Weight:  [65.7 kg (144 lb 13.5 oz)] 65.7 kg (144 lb 13.5 oz) (06/24 2000)  Weight change:  Filed Weights   05/01/18 0930 05/01/18 2000  Weight: 65.8 kg (145 lb) 65.7 kg (144 lb 13.5 oz)    Intake/Output: I/O last 3 completed shifts: In: 280.6 [I.V.:180.6; IV Piggyback:100] Out: -    Intake/Output this shift:  No intake/output data recorded.  Physical Exam: General: NAD, laying in bed  Head: Normocephalic, atraumatic. Moist oral mucosal membranes  Eyes: Anicteric, PERRL  Neck: Supple, trachea midline  Lungs:  Clear to auscultation, 2 L [place] O2  Heart: Regular rate and rhythm  Abdomen:  Soft, nontender,   Extremities: no peripheral edema.  Neurologic: Nonfocal, moving all four extremities  Skin: No lesions  Access: Left AVF    Basic Metabolic Panel: Recent Labs  Lab 05/01/18 1015 05/02/18 0602  NA 134* 136  K 4.1 4.8  CL 92* 98  CO2 27 20*  GLUCOSE 241* 147*  BUN 21* 33*  CREATININE 4.47* 5.80*  CALCIUM 8.9 8.5*    Liver Function Tests: Recent Labs  Lab 05/01/18 1015 05/02/18 0602  AST 18 2,610*  ALT 12* 918*  ALKPHOS 85 160*  BILITOT 1.0 1.2  PROT 7.7 6.8  ALBUMIN 3.6 3.1*   No results for input(s): LIPASE, AMYLASE in the last 168 hours. No results for  input(s): AMMONIA in the last 168 hours.  CBC: Recent Labs  Lab 05/01/18 1015 05/02/18 0602  WBC 8.9 14.3*  NEUTROABS 7.8*  --   HGB 11.3* 11.8*  HCT 36.0 37.8  MCV 88.6 89.9  PLT 292 261    Cardiac Enzymes: Recent Labs  Lab 05/01/18 1015 05/01/18 1215  TROPONINI 0.08* 0.06*    BNP: Invalid input(s): POCBNP  CBG: Recent Labs  Lab 05/01/18 2016 05/02/18 0718  GLUCAP 149* 153*    Microbiology: Results for orders placed or performed during the hospital encounter of 05/01/18  MRSA PCR Screening     Status: Abnormal   Collection Time: 05/01/18  8:17 PM  Result Value Ref Range Status   MRSA by PCR POSITIVE (A) NEGATIVE Final    Comment:        The GeneXpert MRSA Assay (FDA approved for NASAL specimens only), is one component of a comprehensive MRSA colonization surveillance program. It is not intended to diagnose MRSA infection nor to guide or monitor treatment for MRSA infections. RESULT CALLED TO, READ BACK BY AND VERIFIED WITH: DALE HOPKINS @2141  05/01/18 AKT Performed at [facility], [address] [place], [place] [address]     Coagulation Studies: No results for input(s): LABPROT, INR in the last 72 hours.  Urinalysis: No results for input(s): COLORURINE, LABSPEC, PHURINE, GLUCOSEU, HGBUR, BILIRUBINUR, KETONESUR, PROTEINUR, UROBILINOGEN, NITRITE, LEUKOCYTESUR in the last 72  hours.  Invalid input(s): APPERANCEUR    Imaging: Dg Chest Portable 1 View  Result Date: 05/01/2018 CLINICAL DATA:  Syncope while at dialysis. EXAM: PORTABLE CHEST 1 VIEW COMPARISON:  02/15/2018 FINDINGS: A dual lead pacemaker remains in place. The cardiac silhouette is borderline enlarged. Aortic atherosclerosis is noted. No airspace consolidation, edema, pleural effusion, or pneumothorax is identified. No acute osseous abnormality is seen. IMPRESSION: No active disease. Electronically Signed   By: Sebastian Ache M.D.   On: 05/01/2018 09:51   US Abdomen Limited  Ruq  Result Date: 05/01/2018 CLINICAL DATA:  Right upper quadrant pain. Nausea and vomiting. Cholecystectomy. EXAM: ULTRASOUND ABDOMEN LIMITED RIGHT UPPER QUADRANT COMPARISON:  Ultrasound dated 11/10/2017 and abdominal CT scan dated 01/01/2016 FINDINGS: Gallbladder: Gallbladder has been removed.  Negative sonographic Murphy's sign. Common bile duct: Diameter: 1.8 mm, normal. Liver: Echogenic foci in the liver consistent with vascular calcifications demonstrated on the prior exam and on the prior CT scan. No mass lesions. Portal vein is patent on color Doppler imaging with normal direction of blood flow towards the liver. IMPRESSION: No significant abnormality.  No change since the prior study. Electronically Signed   By: Francene Boyers M.D.   On: 05/01/2018 13:48     Medications:   . cefTRIAXone (ROCEPHIN)  IV Stopped (05/01/18 2212)  . norepinephrine (LEVOPHED) Adult infusion 1 mcg/min (05/02/18 0825)   . aspirin EC  81 mg Oral QHS  . atorvastatin  80 mg Oral QPM  . calcium acetate  1,334 mg Oral TID WC  . Chlorhexidine Gluconate Cloth  6 each Topical Q0600  . docusate sodium  100 mg Oral BID  . dorzolamide-timolol  1 drop Left Eye BID  . ferrous sulfate  325 mg Oral QHS  . heparin injection (subcutaneous)  5,000 Units Subcutaneous Q8H  . insulin aspart  0-5 Units Subcutaneous QHS  . insulin aspart  0-9 Units Subcutaneous TID WC  . ipratropium-albuterol  3 mL Nebulization Q6H  . latanoprost  1 drop Right Eye QHS  . loratadine  10 mg Oral Daily  . midodrine  5 mg Oral TID WC  . mupirocin ointment  1 application Nasal BID  . ticagrelor  60 mg Oral BID   acetaminophen **OR** acetaminophen, guaiFENesin **AND** dextromethorphan, diphenhydrAMINE, hydrOXYzine, loperamide, nitroGLYCERIN, ondansetron **OR** ondansetron (ZOFRAN) IV, ondansetron  Assessment/ Plan:  Nichole Hurst is a 67 y.o. white female with end stage renal disease on hemodialysis, hypertension, diabetes mellitus type II,  anemia, CVA, pulmonary hypertension, peripheral vascular disease, hyperlipidemia, glaucoma, coronary artery disease, pacemaker placement, systolic congestive heart failure  Admitted to [facility] on 05/01/2018   [facility] [place] MWF [facility]   1. End Stage Renal Disease: MWF - did not complete treatment yesterday. No indication for dialysis at this time.  Resume MWF schedule. Dialysis for tomorrow.   2. Hypotension: with history of hypertension on irbesartan as outpatient. Concern for underlying infection with leukocytosis.  Requiring vasopressors: norepinephrine.  - Empiric ceftriaxone - Unclear if hypotension is from aggressive ultrafiltration on hemodialysis.   3. Anemia of chronic kidney disease: hemoglobin 11.8 - Mircera as outpatient.   4. Secondary Hyperparathyroidism:  - Calcium acetate with meals.    LOS: 1 Clotilde Loth 6/25/201911:03 AM

## 2018-05-02 NOTE — Progress Notes (Signed)
[employer] [facility] [place] [facility]   PATIENT NAME: Nichole EvensGloria Maalouf    MR#:  [id number]  DATE OF BIRTH:  11/12/1950  SUBJECTIVE:  CHIEF COMPLAINT:   Chief Complaint  Patient presents with  . Nausea  feels somewhat better, but BP still lower side REVIEW OF SYSTEMS:  Review of Systems  Constitutional: Negative for diaphoresis, fever, malaise/fatigue and weight loss.  HENT: Negative for ear discharge, ear pain, hearing loss, nosebleeds, sore throat and tinnitus.   Eyes: Negative for blurred vision and pain.  Respiratory: Negative for cough, hemoptysis, shortness of breath and wheezing.   Cardiovascular: Negative for chest pain, palpitations, orthopnea and leg swelling.  Gastrointestinal: Negative for abdominal pain, blood in stool, constipation, diarrhea, heartburn, nausea and vomiting.  Genitourinary: Negative for dysuria, frequency and urgency.  Musculoskeletal: Negative for back pain and myalgias.  Skin: Negative for itching and rash.  Neurological: Negative for dizziness, tingling, tremors, focal weakness, seizures, weakness and headaches.  Psychiatric/Behavioral: Negative for depression. The patient is not nervous/anxious.     DRUG ALLERGIES:   Allergies  Allergen Reactions  . Colchicine Anaphylaxis  . Clopidogrel    VITALS:  Blood pressure 104/78, pulse 84, temperature 97.6 F (36.4 C), resp. rate 18, height 5\' 1"  (1.549 m), weight 65.7 kg (144 lb 13.5 oz), SpO2 100 %. PHYSICAL EXAMINATION:  Physical Exam  Constitutional: She is oriented to person, place, and time.  HENT:  Head: Normocephalic and atraumatic.  Eyes: Pupils are equal, round, and reactive to light. Conjunctivae and EOM are normal.  Neck: Normal range of motion. Neck supple. No tracheal deviation present. No thyromegaly present.  Cardiovascular: Normal rate, regular rhythm and normal heart sounds.  Pulmonary/Chest: Effort normal and breath sounds normal. No respiratory distress. She has  no wheezes. She exhibits no tenderness.  Abdominal: Soft. Bowel sounds are normal. She exhibits no distension. There is no tenderness.  Musculoskeletal: Normal range of motion.  Neurological: She is alert and oriented to person, place, and time. No cranial nerve deficit.  Skin: Skin is warm and dry. No rash noted.   LABORATORY PANEL:  Female CBC Recent Labs  Lab 05/02/18 0602  WBC 14.3*  HGB 11.8*  HCT 37.8  PLT 261   ------------------------------------------------------------------------------------------------------------------ Chemistries  Recent Labs  Lab 05/02/18 0602  NA 136  K 4.8  CL 98  CO2 20*  GLUCOSE 147*  BUN 33*  CREATININE 5.80*  CALCIUM 8.5*  AST 2,610*  ALT 918*  ALKPHOS 160*  BILITOT 1.2   RADIOLOGY:  No results found. ASSESSMENT AND PLAN:  Nichole Hurst  is a 67 y.o. female with a known history of end-stage renal disease on hemodialysis got 3 L of fluid taken out today and eventually patient was dizzy and passed out.  Patient has history of diabetes metas, coronary artery disease, COPD, hyperlipidemia and systolic congestive heart failure.  Patient is brought into the emergency department and she has received 750 cc of fluid bolus with no significant improvement in the blood pressure.  During my examination systolic blood pressure is at 76 with a diastolic at 51.  Map at around 55-60.    #syncope with severe hypotension Monitor in stepdown unit - on norepinephrine to maintain BP - not sure if this is due to infection or too much fluid removal at HD  #Acute bronchitis sputum culture and sensitivity and IV Rocephin  #End-stage renal disease on hemodialysis Nephrology following for HD needs  #Elevated troponin but patient is denying any chest  pain: due to demand ischemia Mildly elevated troponin in the setting of end-stage renal disease, patient denies any chest pain we will continue close monitoring  #Diabetes mellitus provide sliding scale  insulin  #Chronic COPD no exacerbation Nebulizer treatments  #Chronic systolic congestive heart failure with recent ejection fraction 30 to 35% Monitor fluid status with HD       All the records are reviewed and case discussed with Care Management/Social Worker. Management plans discussed with the patient, family and they are in agreement.  CODE STATUS: Full Code  TOTAL TIME TAKING CARE OF THIS PATIENT: 20 minutes.   More than 50% of the time was spent in counseling/coordination of care: YES  POSSIBLE D/C IN 2-3 DAYS, DEPENDING ON CLINICAL CONDITION.   Delfino Lovett M.D on 05/02/2018 at 6:47 PM  Between 7am to 6pm - Pager - [phone]  After 6pm go to [email or web address] - Scientist, research (life sciences) [facility]  Office  [phone]  CC: Primary care physician; [employer]  Note: This dictation was prepared with Nurse, children's dictation along with smaller Lobbyist. Any transcriptional errors that result from this process are unintentional.

## 2018-05-02 NOTE — Progress Notes (Signed)
   05/02/18 1100  Clinical Encounter Type  Visited With Patient and family together  Visit Type Follow-up  Referral From Patient;Chaplain;Nurse  Consult/Referral To [employer]Chaplain  Chaplain received and OR PT request prayer. Chaplain enter into room and PT said there she is. Chaplain had visit the PT yesterday and prayed for PT. So, PT remember Chaplain. Chaplain told PT if you wanted to see me again, all you had to do was give me your address, I would have came to your house. PT laughed. Pt sister also was in room. PT sister gave Chaplain a hug. Chaplain walk to PT and gave her a hug as PT reached out to [employer]plain. Chaplain ask PT how was she and PT said better than yesterday. Chaplain said yes, you look better today. PT said my blood pressure keep going down. Chaplain said ok, we need to pray that, the blood pressure line up with the will of God. PT and PT sister said Amen! Chaplain prayed and ask was there anything else she could do. Pt and sister said no and thanked OrthoptistChaplain. Chaplain said she would be back later and that she was here all night. PT said good.

## 2018-05-02 NOTE — Progress Notes (Signed)
PULMONARY / CRITICAL CARE MEDICINE   Name: Nichole Hurst MRN: [id number] DOB: 08/21/1951    ADMISSION DATE:  05/01/2018   CONSULTATION DATE:  05/01/2018  REFERRING MD: Dr. Amado CoeGouru  Reason: Syncope and hypotension  HISTORY OF PRESENT ILLNESS:   This is a 67 year old female with a medical history as indicated below who presented to the ED with hypotension and a syncopal episode.  Patient was at dialysis when she suddenly felt nauseated and had a syncopal episode.  Incident occurred 36 minutes post dialysis.  She had 3 L of fluid taken out during dialysis.  At the ED, patient was found to be hypotensive.  She was given 750 cc fluid bolus without improvement in blood pressure.  Her ED work-up was unremarkable.  She was started on pressors and admitted to the ICU for further management. Patient is currently awake and and complaining of a mild cough that started a week ago.  She denies chest pain palpitations dizziness and headache.    REVIEW OF SYSTEMS:   Constitutional: Negative for fever and chills.  HENT: Negative for congestion and rhinorrhea.  Eyes: Negative for redness and visual disturbance.  Respiratory: Negative for shortness of breath and wheezing but positive for mild cough.  Cardiovascular: Negative for chest pain and palpitations.  Gastrointestinal negative abdominal pain, N/V  and  Loose stools Genitourinary: Negative for dysuria and urgency.  Endocrine: Denies polyuria, polyphagia and heat intolerance Musculoskeletal: Negative for myalgias and arthralgias.  Skin: Negative for pallor and wound.  Neurological: Negative for dizziness and headaches   SUBJECTIVE:   VITAL SIGNS: BP 107/77   Pulse 83   Temp 97.6 F (36.4 C)   Resp 20   Ht 5\' 1"  (1.549 m)   Wt 144 lb 13.5 oz (65.7 kg)   SpO2 100%   BMI 27.37 kg/m   HEMODYNAMICS:  no compromise  OXTYGEN:  2 liters  INTAKE / OUTPUT: I/O last 3 completed shifts: In: 280.6 [I.V.:180.6; IV Piggyback:100] Out: -    PHYSICAL EXAMINATION: General: No acute distress Neuro: Alert and oriented x3 no focal deficits HEENT: PERRLA, no JVD Cardiovascular: Pulse mildly tachycardic, S1-S2, no murmur regurg or gallop, +2 pulses bilaterally Lungs: Bilateral breath sounds, diminished in the bases, no wheezes or rhonchi Abdomen: Nondistended, normal bowel sounds in all 4 quadrants, palpation reveals no organomegaly Musculoskeletal: Positive range of motion in upper and lower extremities Skin: Warm and dry  LABS:  BMET Recent Labs  Lab 05/01/18 1015 05/02/18 0602  NA 134* 136  K 4.1 4.8  CL 92* 98  CO2 27 20*  BUN 21* 33*  CREATININE 4.47* 5.80*  GLUCOSE 241* 147*    Electrolytes Recent Labs  Lab 05/01/18 1015 05/02/18 0602  CALCIUM 8.9 8.5*    CBC Recent Labs  Lab 05/01/18 1015 05/02/18 0602  WBC 8.9 14.3*  HGB 11.3* 11.8*  HCT 36.0 37.8  PLT 292 261    Coag's No results for input(s): APTT, INR in the last 168 hours.  Sepsis Markers No results for input(s): LATICACIDVEN, PROCALCITON, O2SATVEN in the last 168 hours.  ABG No results for input(s): PHART, PCO2ART, PO2ART in the last 168 hours.  Liver Enzymes Recent Labs  Lab 05/01/18 1015 05/02/18 0602  AST 18 2,610*  ALT 12* 918*  ALKPHOS 85 160*  BILITOT 1.0 1.2  ALBUMIN 3.6 3.1*    Cardiac Enzymes Recent Labs  Lab 05/01/18 1015 05/01/18 1215  TROPONINI 0.08* 0.06*    Glucose Recent Labs  Lab 05/01/18 2016 05/02/18  1610  GLUCAP 149* 153*    Imaging Dg Chest Portable 1 View  Result Date: 05/01/2018 CLINICAL DATA:  Syncope while at dialysis. EXAM: PORTABLE CHEST 1 VIEW COMPARISON:  02/15/2018 FINDINGS: A dual lead pacemaker remains in place. The cardiac silhouette is borderline enlarged. Aortic atherosclerosis is noted. No airspace consolidation, edema, pleural effusion, or pneumothorax is identified. No acute osseous abnormality is seen. IMPRESSION: No active disease. Electronically Signed   By: Sebastian Ache M.D.   On: 05/01/2018 09:51   US Abdomen Limited Ruq  Result Date: 05/01/2018 CLINICAL DATA:  Right upper quadrant pain. Nausea and vomiting. Cholecystectomy. EXAM: ULTRASOUND ABDOMEN LIMITED RIGHT UPPER QUADRANT COMPARISON:  Ultrasound dated 11/10/2017 and abdominal CT scan dated 01/01/2016 FINDINGS: Gallbladder: Gallbladder has been removed.  Negative sonographic Murphy's sign. Common bile duct: Diameter: 1.8 mm, normal. Liver: Echogenic foci in the liver consistent with vascular calcifications demonstrated on the prior exam and on the prior CT scan. No mass lesions. Portal vein is patent on color Doppler imaging with normal direction of blood flow towards the liver. IMPRESSION: No significant abnormality.  No change since the prior study. Electronically Signed   By: Francene Boyers M.D.   On: 05/01/2018 13:48   STUDIES:  Last echo was 02/13/2018: LV EF: 30% -   35%  CULTURES: None  ANTIBIOTICS: Ceftriaxone for?bronchitis  SIGNIFICANT EVENTS: 6/24: Admitted  LINES/TUBES: Peripheral IVs  DISCUSSION: 67 year old female with end-stage renal disease presenting with syncope secondary to post dialysis hypotension  ASSESSMENT Hypotension secondary to large volume dialysis-improved Syncope due to hypotension End-stage renal disease on hemodialysis Acute bronchitis-questionable as this patient is not actively coughing Type 2 diabetes mellitus CMP/ Chronic systolic heart failure with reduced EF of 30 to 35% Demand ischemia Transaminitis/ ischemic hepatitis from hypotension- U/S abdomen negative   PLAN Hemodynamic monitoring per ICU protocol Follow up of transaminites Trend procalcitonin and discontinue antibiotics if normal Monitor and correct electrolyte imbalances Follow-up with nephrology regarding inpatient dialysis Resume all home medications  Blood glucose monitoring with sliding scale insulin coverage GI and DVT prophylaxis  FAMILY  - Updates: Patient updated on current  treatment plan  Jackson Latino, MD Pulmonary and Critical Care Medicine [facility] Pager [phone] or [phone]   05/02/2018, 9:43 AM

## 2018-05-03 LAB — CBC WITH DIFFERENTIAL/PLATELET
Basophils Absolute: 0.1 10*3/uL (ref 0–0.1)
Basophils Relative: 1 %
Eosinophils Absolute: 0.1 10*3/uL (ref 0–0.7)
Eosinophils Relative: 1 %
HCT: 34.9 % — ABNORMAL LOW (ref 35.0–47.0)
HEMOGLOBIN: 11.1 g/dL — AB (ref 12.0–16.0)
LYMPHS ABS: 1.5 10*3/uL (ref 1.0–3.6)
LYMPHS PCT: 13 %
MCH: 28.1 pg (ref 26.0–34.0)
MCHC: 31.8 g/dL — ABNORMAL LOW (ref 32.0–36.0)
MCV: 88.3 fL (ref 80.0–100.0)
Monocytes Absolute: 0.5 10*3/uL (ref 0.2–0.9)
Monocytes Relative: 5 %
NEUTROS PCT: 80 %
Neutro Abs: 8.7 10*3/uL — ABNORMAL HIGH (ref 1.4–6.5)
Platelets: 209 10*3/uL (ref 150–440)
RBC: 3.95 MIL/uL (ref 3.80–5.20)
RDW: 21.4 % — ABNORMAL HIGH (ref 11.5–14.5)
WBC: 10.9 10*3/uL (ref 3.6–11.0)

## 2018-05-03 LAB — COMPREHENSIVE METABOLIC PANEL
ALBUMIN: 2.9 g/dL — AB (ref 3.5–5.0)
ALT: 1437 U/L — ABNORMAL HIGH (ref 0–44)
ALT: 1335 U/L — ABNORMAL HIGH (ref 0–44)
AST: 3288 U/L — AB (ref 15–41)
AST: 2131 U/L — AB (ref 15–41)
Albumin: 2.9 g/dL — ABNORMAL LOW (ref 3.5–5.0)
Alkaline Phosphatase: 136 U/L — ABNORMAL HIGH (ref 38–126)
Alkaline Phosphatase: 137 U/L — ABNORMAL HIGH (ref 38–126)
Anion gap: 16 — ABNORMAL HIGH (ref 5–15)
Anion gap: 15 (ref 5–15)
BUN: 57 mg/dL — AB (ref 8–23)
BUN: 63 mg/dL — AB (ref 8–23)
CHLORIDE: 98 mmol/L (ref 98–111)
CHLORIDE: 92 mmol/L — AB (ref 98–111)
CO2: 21 mmol/L — ABNORMAL LOW (ref 22–32)
CO2: 21 mmol/L — AB (ref 22–32)
Calcium: 8.1 mg/dL — ABNORMAL LOW (ref 8.9–10.3)
Calcium: 8.1 mg/dL — ABNORMAL LOW (ref 8.9–10.3)
Creatinine, Ser: 7.22 mg/dL — ABNORMAL HIGH (ref 0.44–1.00)
Creatinine, Ser: 7.81 mg/dL — ABNORMAL HIGH (ref 0.44–1.00)
GFR calc non Af Amer: 5 mL/min — ABNORMAL LOW (ref >60)
GFR calc non Af Amer: 5 mL/min — ABNORMAL LOW (ref >60)
GFR, EST AFRICAN AMERICAN: 6 mL/min — AB (ref >60)
GFR, EST AFRICAN AMERICAN: 6 mL/min — AB (ref >60)
Glucose, Bld: 100 mg/dL — ABNORMAL HIGH (ref 70–99)
Glucose, Bld: 232 mg/dL — ABNORMAL HIGH (ref 70–99)
POTASSIUM: 4.8 mmol/L (ref 3.5–5.1)
Potassium: 4.5 mmol/L (ref 3.5–5.1)
SODIUM: 135 mmol/L (ref 135–145)
SODIUM: 128 mmol/L — AB (ref 135–145)
Total Bilirubin: 1.3 mg/dL — ABNORMAL HIGH (ref 0.3–1.2)
Total Bilirubin: 1 mg/dL (ref 0.3–1.2)
Total Protein: 6.1 g/dL — ABNORMAL LOW (ref 6.5–8.1)
Total Protein: 6.4 g/dL — ABNORMAL LOW (ref 6.5–8.1)

## 2018-05-03 LAB — HEMOGLOBIN A1C
Hgb A1c MFr Bld: 7.8 % — ABNORMAL HIGH (ref 4.8–5.6)
Mean Plasma Glucose: 177.16 mg/dL

## 2018-05-03 LAB — HIV ANTIBODY (ROUTINE TESTING W REFLEX): HIV Screen 4th Generation wRfx: NONREACTIVE

## 2018-05-03 LAB — GLUCOSE, CAPILLARY
Glucose-Capillary: 95 mg/dL (ref 70–99)
Glucose-Capillary: 179 mg/dL — ABNORMAL HIGH (ref 70–99)
Glucose-Capillary: 101 mg/dL — ABNORMAL HIGH (ref 70–99)

## 2018-05-03 LAB — PROCALCITONIN: PROCALCITONIN: 4.99 ng/mL

## 2018-05-03 MED ORDER — INSULIN ASPART 100 UNIT/ML ~~LOC~~ SOLN
2.0000 [IU] | Freq: Three times a day (TID) | SUBCUTANEOUS | Status: DC
  Administered 2018-05-05 – 2018-05-06 (×4): 2 [IU] via SUBCUTANEOUS
  Filled 2018-05-03 (×4): qty 1

## 2018-05-03 NOTE — Progress Notes (Addendum)
[employer] - [place] at [facility]   PATIENT NAME: Nichole Hurst    MR#:  [id number]  DATE OF BIRTH:  Mar 22, 1951  SUBJECTIVE:  CHIEF COMPLAINT:   Chief Complaint  Patient presents with  . Nausea  The patient feels nausea, BP is better. REVIEW OF SYSTEMS:  Review of Systems  Constitutional: Negative for diaphoresis, fever, malaise/fatigue and weight loss.  HENT: Negative for ear discharge, ear pain, hearing loss, nosebleeds, sore throat and tinnitus.   Eyes: Negative for blurred vision and pain.  Respiratory: Negative for cough, hemoptysis, shortness of breath and wheezing.   Cardiovascular: Negative for chest pain, palpitations, orthopnea and leg swelling.  Gastrointestinal: Positive for nausea. Negative for abdominal pain, blood in stool, constipation, diarrhea, heartburn and vomiting.  Genitourinary: Negative for dysuria, frequency and urgency.  Musculoskeletal: Negative for back pain and myalgias.  Skin: Negative for itching and rash.  Neurological: Negative for dizziness, tingling, tremors, focal weakness, seizures, weakness and headaches.  Psychiatric/Behavioral: Negative for depression. The patient is not nervous/anxious.     DRUG ALLERGIES:   Allergies  Allergen Reactions  . Colchicine Anaphylaxis  . Clopidogrel    VITALS:  Blood pressure 114/81, pulse 82, temperature 98.2 F (36.8 C), temperature source Oral, resp. rate (!) 21, height 5\' 1"  (1.549 m), weight 150 lb 12.7 oz (68.4 kg), SpO2 100 %. PHYSICAL EXAMINATION:  Physical Exam  Constitutional: She is oriented to person, place, and time.  HENT:  Head: Normocephalic and atraumatic.  Eyes: Pupils are equal, round, and reactive to light. Conjunctivae and EOM are normal.  Neck: Normal range of motion. Neck supple. No tracheal deviation present. No thyromegaly present.  Cardiovascular: Normal rate, regular rhythm and normal heart sounds.  Pulmonary/Chest: Effort normal and breath sounds normal.  No respiratory distress. She has no wheezes. She exhibits no tenderness.  Abdominal: Soft. Bowel sounds are normal. She exhibits no distension. There is no tenderness.  Musculoskeletal: Normal range of motion.  Neurological: She is alert and oriented to person, place, and time. No cranial nerve deficit.  Skin: Skin is warm and dry. No rash noted.   LABORATORY PANEL:  Female CBC Recent Labs  Lab 05/03/18 0532  WBC 10.9  HGB 11.1*  HCT 34.9*  PLT 209   ------------------------------------------------------------------------------------------------------------------ Chemistries  Recent Labs  Lab 05/03/18 0532  NA 135  K 4.8  CL 98  CO2 21*  GLUCOSE 100*  BUN 57*  CREATININE 7.22*  CALCIUM 8.1*  AST 3,288*  ALT 1,437*  ALKPHOS 136*  BILITOT 1.3*   RADIOLOGY:  No results found. ASSESSMENT AND PLAN:  Nichole Hurst  is a 67 y.o. female with a known history of end-stage renal disease on hemodialysis got 3 L of fluid taken out and eventually patient was dizzy and passed out.  Patient has history of diabetes metas, coronary artery disease, COPD, hyperlipidemia and systolic congestive heart failure.  Patient is brought into the emergency department and she has received 750 cc of fluid bolus with no significant improvement in the blood pressure.  During my examination systolic blood pressure is at 76 with a diastolic at 51.  Map at around 55-60.    #syncope with severe hypotension Off norepinephrine. Started on midodrine.  - not sure if this is due to infection or too much fluid removal at HD Empiric ceftriaxone.  #Acute bronchitis sputum culture and sensitivity and IV Rocephin  #End-stage renal disease on hemodialysis  Continue hemodialysis today.  #Elevated troponin but patient is denying any  chest pain: due to demand ischemia Mildly elevated troponin in the setting of end-stage renal disease, patient denies any chest pain we will continue close monitoring  #Diabetes  mellitus provide sliding scale insulin  #Chronic respiratory failure with COPD,  no exacerbation Nebulizer treatments  #Chronic systolic congestive heart failure with recent ejection fraction 30 to 35% Monitor fluid status with HD  Abnormal liver function test, liver function is getting worse today.  Possible due to shock liver. Abdominal ultrasound is unremarkable.  Follow-up liver function test and a GI consult.  All the records are reviewed and case discussed with Care Management/Social Worker. Management plans discussed with the patient, family and they are in agreement.  CODE STATUS: Full Code  TOTAL TIME TAKING CARE OF THIS PATIENT: 37 minutes.   More than 50% of the time was spent in counseling/coordination of care: YES  POSSIBLE D/C IN 2-3 DAYS, DEPENDING ON CLINICAL CONDITION.   Shaune PollackQing Romello Hoehn M.D on 05/03/2018 at 5:51 PM  Between 7am to 6pm - Pager - [phone]  After 6pm go to [email or web address] - Scientist, research (life sciences)password EPAS [email or web address]  [facility] [place] [facility]  Office  [phone]  CC: Primary care physician; Inc, [facility]  Note: This dictation was prepared with Nurse, children'sDragon dictation along with smaller Lobbyistphrase technology. Any transcriptional errors that result from this process are unintentional.

## 2018-05-03 NOTE — Progress Notes (Signed)
Pre HD assessment    05/03/18 1605  Neurological  Level of Consciousness Alert  Orientation Level Oriented X4  Respiratory  Respiratory Pattern Regular;Unlabored  Chest Assessment Chest expansion symmetrical  Cardiac  ECG Monitor Yes  Vascular  R Radial Pulse +2  L Radial Pulse +2  Edema Generalized  Integumentary  Integumentary (WDL) X  Skin Color Appropriate for ethnicity  Musculoskeletal  Musculoskeletal (WDL) X  Generalized Weakness Yes  Assistive Device None  GU Assessment  Genitourinary (WDL) X  Genitourinary Symptoms  (HD)  Psychosocial  Psychosocial (WDL) WDL

## 2018-05-03 NOTE — Progress Notes (Signed)
1st attempt to get report from nurse    05/03/18 0915  Hand-Off documentation  Report given to (Full Name) Granville LewisMelissa Audrianna Driskill,RN  Report received from (Full Name) Leda Gauzendrea Holloway,RN

## 2018-05-03 NOTE — Progress Notes (Signed)
Inpatient Diabetes Program Recommendations  AACE/ADA: New Consensus Statement on Inpatient Glycemic Control (2019)  Target Ranges:  Prepandial:   less than 140 mg/dL      Peak postprandial:   less than 180 mg/dL (1-2 hours)      Critically ill patients:  140 - 180 mg/dL   Results for Nichole Hurst, Nichole Hurst (MRN [id number]) as of 05/03/2018 11:16  Ref. Range 05/02/2018 07:18 05/02/2018 11:33 05/02/2018 16:47 05/02/2018 22:00 05/03/2018 08:09  Glucose-Capillary Latest Ref Range: 70 - 99 mg/dL 409153 (H) 811205 (H) 914223 (H) 146 (H) 95  Results for Nichole Hurst, Nichole Hurst (MRN [id number]) as of 05/03/2018 11:16  Ref. Range 02/12/2018 03:38 05/02/2018 06:02  Hemoglobin A1C Latest Ref Range: 4.8 - 5.6 % 8.1 (H) 7.8 (H)   Review of Glycemic Control  Diabetes history: DM2 Outpatient Diabetes medications: None Current orders for Inpatient glycemic control: Novolog 0-9 units TID with meals, Novolog 0-5 units QHS  Inpatient Diabetes Program Recommendations:  Insulin - Meal Coverage: While inpatient , please consider ordering Novolog 2 units TID with meals for meal coverage if patient eats at least 50% of meals. HgbA1C: A1C 7.8% on 05/02/18 indicating an average glucose of 177 mg/dl over the past 2-3 months. Prior A1C was 8.1% on 02/12/18. Diabetes Coordinator spoke with patient on 02/13/18 during prior hospitalization and patient refused to take any DM medications as an outpatient.  Thanks, Orlando PennerMarie Milah Recht, RN, MSN, CDE Diabetes Coordinator Inpatient Diabetes Program [phone] (Team Pager from 8am to 5pm)

## 2018-05-03 NOTE — Progress Notes (Signed)
Post HD assessment. PT tolerated tx well without c/o or complication. Net UF 2009, goal met.    05/03/18 1958  Vital Signs  Temp 98.8 F (37.1 C)  Temp Source Oral  Pulse Rate (!) 104  Pulse Rate Source Monitor  Resp 16  BP 103/83  BP Location Right Arm  BP Method Automatic  Patient Position (if appropriate) Lying  Oxygen Therapy  SpO2 100 %  O2 Device Nasal Cannula  O2 Flow Rate (L/min) 3 L/min  Dialysis Weight  Weight 67.1 kg (147 lb 14.9 oz)  Type of Weight Post-Dialysis  Post-Hemodialysis Assessment  Rinseback Volume (mL) 250 mL  KECN 77.5 V  Dialyzer Clearance Lightly streaked  Duration of HD Treatment -hour(s) 3.5 hour(s)  Hemodialysis Intake (mL) 500 mL  UF Total -Machine (mL) 2509 mL  Net UF (mL) 2009 mL  Tolerated HD Treatment Yes  AVG/AVF Arterial Site Held (minutes) 10 minutes  AVG/AVF Venous Site Held (minutes) 10 minutes  Education / Care Plan  Dialysis Education Provided Yes  Documented Education in Care Plan Yes  Fistula / Graft Left Upper arm  Placement Date/Time: 11/29/17 0900   Placed prior to admission: Yes  Orientation: Left  Access Location: Upper arm  Site Condition No complications  Fistula / Graft Assessment Present;Thrill;Bruit  Status Deaccessed  Drainage Description None

## 2018-05-03 NOTE — Progress Notes (Signed)
HD tx start    05/03/18 1615  Vital Signs  Pulse Rate 92  Pulse Rate Source Monitor  Resp 13  BP 99/65  BP Location Right Arm  BP Method Automatic  Patient Position (if appropriate) Lying  Oxygen Therapy  SpO2 95 %  O2 Device Nasal Cannula  O2 Flow Rate (L/min) 3 L/min  During Hemodialysis Assessment  Blood Flow Rate (mL/min) 400 mL/min  Arterial Pressure (mmHg) -160 mmHg  Venous Pressure (mmHg) 200 mmHg  Transmembrane Pressure (mmHg) 60 mmHg  Ultrafiltration Rate (mL/min) 710 mL/min  Dialysate Flow Rate (mL/min) 600 ml/min  Conductivity: Machine  14.1  HD Safety Checks Performed Yes  Dialysis Fluid Bolus Normal Saline  Bolus Amount (mL) 250 mL  Intra-Hemodialysis Comments Tx initiated  Fistula / Graft Left Upper arm  Placement Date/Time: 11/29/17 0900   Placed prior to admission: Yes  Orientation: Left  Access Location: Upper arm  Status Accessed  Needle Size 15

## 2018-05-03 NOTE — Progress Notes (Signed)
Transferred to 2-C as ordered.  Report called to receiving RN.  Pt left floor via bed on O2 and telemetry with all personal belongings in tow.

## 2018-05-03 NOTE — Progress Notes (Signed)
HD tx end    05/03/18 1948  Vital Signs  Pulse Rate (!) 102  Pulse Rate Source Monitor  Resp 16  BP 106/83  BP Location Right Arm  BP Method Automatic  Patient Position (if appropriate) Lying  Oxygen Therapy  SpO2 97 %  O2 Device Nasal Cannula  O2 Flow Rate (L/min) 3 L/min  During Hemodialysis Assessment  Dialysis Fluid Bolus Normal Saline  Bolus Amount (mL) 250 mL  Intra-Hemodialysis Comments Tx completed

## 2018-05-03 NOTE — Progress Notes (Signed)
[facility]  ROUNDING NOTE   Subjective:   Sister at bedside.   Moved from ICU to [facility]2C.   Off norepinephrine.   Hemodialysis for later today.   Objective:  Vital signs in last 24 hours:  Temp:  [98.1 F (36.7 C)-98.8 F (37.1 C)] 98.1 F (36.7 C) (06/26 0616) Pulse Rate:  [73-96] 74 (06/26 0616) Resp:  [17-23] 20 (06/26 0616) BP: (82-130)/(44-92) 94/80 (06/26 0616) SpO2:  [80 %-100 %] 100 % (06/26 0616)  Weight change:  Filed Weights   05/01/18 0930 05/01/18 2000  Weight: 65.8 kg (145 lb) 65.7 kg (144 lb 13.5 oz)    Intake/Output: I/O last 3 completed shifts: In: 309.2 [I.V.:209.2; IV Piggyback:100] Out: -    Intake/Output this shift:  No intake/output data recorded.  Physical Exam: General: NAD, laying in bed  Head: Normocephalic, atraumatic. Moist oral mucosal membranes  Eyes: Anicteric, PERRL  Neck: Supple, trachea midline  Lungs:  Clear to auscultation, room air  Heart: Regular rate and rhythm  Abdomen:  Soft, nontender,   Extremities: no peripheral edema.  Neurologic: Nonfocal, moving all four extremities  Skin: No lesions  Access: Left AVF    Basic Metabolic Panel: Recent Labs  Lab 05/01/18 1015 05/02/18 0602 05/03/18 0532  NA 134* 136 135  K 4.1 4.8 4.8  CL 92* 98 98  CO2 27 20* 21*  GLUCOSE 241* 147* 100*  BUN 21* 33* 57*  CREATININE 4.47* 5.80* 7.22*  CALCIUM 8.9 8.5* 8.1*    Liver Function Tests: Recent Labs  Lab 05/01/18 1015 05/02/18 0602 05/03/18 0532  AST 18 2,610* 3,288*  ALT 12* 918* 1,437*  ALKPHOS 85 160* 136*  BILITOT 1.0 1.2 1.3*  PROT 7.7 6.8 6.1*  ALBUMIN 3.6 3.1* 2.9*   No results for input(s): LIPASE, AMYLASE in the last 168 hours. No results for input(s): AMMONIA in the last 168 hours.  CBC: Recent Labs  Lab 05/01/18 1015 05/02/18 0602 05/03/18 0532  WBC 8.9 14.3* 10.9  NEUTROABS 7.8*  --  8.7*  HGB 11.3* 11.8* 11.1*  HCT 36.0 37.8 34.9*  MCV 88.6 89.9 88.3  PLT 292 261 209     Cardiac Enzymes: Recent Labs  Lab 05/01/18 1015 05/01/18 1215  TROPONINI 0.08* 0.06*    BNP: Invalid input(s): POCBNP  CBG: Recent Labs  Lab 05/02/18 0718 05/02/18 1133 05/02/18 1647 05/02/18 2200 05/03/18 0809  GLUCAP 153* 205* 223* 146* 95    Microbiology: Results for orders placed or performed during the hospital encounter of 05/01/18  MRSA PCR Screening     Status: Abnormal   Collection Time: 05/01/18  8:17 PM  Result Value Ref Range Status   MRSA by PCR POSITIVE (A) NEGATIVE Final    Comment:        The GeneXpert MRSA Assay (FDA approved for NASAL specimens only), is one component of a comprehensive MRSA colonization surveillance program. It is not intended to diagnose MRSA infection nor to guide or monitor treatment for MRSA infections. RESULT CALLED TO, READ BACK BY AND VERIFIED WITH: DALE HOPKINS @2141  05/01/18 AKT Performed at [facility], [address] [place], [place] [address]     Coagulation Studies: No results for input(s): LABPROT, INR in the last 72 hours.  Urinalysis: No results for input(s): COLORURINE, LABSPEC, PHURINE, GLUCOSEU, HGBUR, BILIRUBINUR, KETONESUR, PROTEINUR, UROBILINOGEN, NITRITE, LEUKOCYTESUR in the last 72 hours.  Invalid input(s): APPERANCEUR    Imaging: [place] Abdomen Limited Ruq  Result Date: 05/01/2018 CLINICAL DATA:  Right upper  quadrant pain. Nausea and vomiting. Cholecystectomy. EXAM: ULTRASOUND ABDOMEN LIMITED RIGHT UPPER QUADRANT COMPARISON:  Ultrasound dated 11/10/2017 and abdominal CT scan dated 01/01/2016 FINDINGS: Gallbladder: Gallbladder has been removed.  Negative sonographic Murphy's sign. Common bile duct: Diameter: 1.8 mm, normal. Liver: Echogenic foci in the liver consistent with vascular calcifications demonstrated on the prior exam and on the prior CT scan. No mass lesions. Portal vein is patent on color Doppler imaging with normal direction of blood flow towards the liver. IMPRESSION: No  significant abnormality.  No change since the prior study. Electronically Signed   By: Francene Boyers M.D.   On: 05/01/2018 13:48     Medications:   . cefTRIAXone (ROCEPHIN)  IV Stopped (05/02/18 1929)   . aspirin EC  81 mg Oral QHS  . calcium acetate  1,334 mg Oral TID WC  . Chlorhexidine Gluconate Cloth  6 each Topical Q0600  . docusate sodium  100 mg Oral BID  . dorzolamide-timolol  1 drop Left Eye BID  . ferrous sulfate  325 mg Oral QHS  . heparin injection (subcutaneous)  5,000 Units Subcutaneous Q8H  . insulin aspart  0-5 Units Subcutaneous QHS  . insulin aspart  0-9 Units Subcutaneous TID WC  . ipratropium-albuterol  3 mL Nebulization Q6H  . latanoprost  1 drop Right Eye QHS  . loratadine  10 mg Oral Daily  . midodrine  5 mg Oral TID WC  . mupirocin ointment  1 application Nasal BID  . ticagrelor  60 mg Oral BID   acetaminophen **OR** acetaminophen, guaiFENesin **AND** dextromethorphan, diphenhydrAMINE, hydrOXYzine, loperamide, nitroGLYCERIN, ondansetron **OR** ondansetron (ZOFRAN) IV, ondansetron  Assessment/ Plan:  [name] is a 67 y.o. white female with end stage renal disease on hemodialysis, hypertension, diabetes mellitus type II, anemia, CVA, pulmonary hypertension, peripheral vascular disease, hyperlipidemia, glaucoma, coronary artery disease, pacemaker placement, systolic congestive heart failure  Admitted to [facility] on 05/01/2018 for hypotension  [facility] MWF [facility]   1. End Stage Renal Disease: MWF  Resume MWF schedule. Dialysis for today. Orders prepared.   2. Hypotension: with history of hypertension on irbesartan as outpatient. Concern for underlying infection with leukocytosis.  Weaned off norepinephrine Started on midodrine.   - Empiric ceftriaxone - Unclear if hypotension is from aggressive ultrafiltration on hemodialysis.   3. Anemia of chronic kidney disease: hemoglobin 11.1 - Mircera as outpatient.   4. Secondary  Hyperparathyroidism:  - Calcium acetate with meals.    LOS: 2 Susanne Baumgarner 6/26/201910:51 AM

## 2018-05-03 NOTE — Progress Notes (Signed)
Post HD assessment    05/03/18 1957  Neurological  Level of Consciousness Alert  Orientation Level Oriented X4  Respiratory  Respiratory Pattern Regular;Unlabored  Chest Assessment Chest expansion symmetrical  Cough Non-productive  Cardiac  ECG Monitor Yes  Cardiac Rhythm ST  Antiarrhythmic device Yes  Vascular  R Radial Pulse +2  L Radial Pulse +2  Edema Generalized  Integumentary  Integumentary (WDL) X  Skin Color Appropriate for ethnicity  Musculoskeletal  Musculoskeletal (WDL) X  Generalized Weakness Yes  Assistive Device None  GU Assessment  Genitourinary (WDL) X  Genitourinary Symptoms  (HD)  Psychosocial  Psychosocial (WDL) WDL

## 2018-05-03 NOTE — Progress Notes (Signed)
Pre HD assessment    05/03/18 1604  Vital Signs  Temp 98.2 F (36.8 C)  Temp Source Oral  Pulse Rate 97  Pulse Rate Source Monitor  Resp 20  BP 99/79  BP Location Right Arm  BP Method Automatic  Patient Position (if appropriate) Lying  Oxygen Therapy  SpO2 99 %  O2 Device Nasal Cannula  O2 Flow Rate (L/min) 3 L/min  Pain Assessment  Pain Scale 0-10  Pain Score 0  Dialysis Weight  Weight 68.4 kg (150 lb 12.7 oz)  Type of Weight Pre-Dialysis  Time-Out for Hemodialysis  What Procedure? HD  Pt Identifiers(min of two) First/Last Name;MRN/Account#  Correct Site? Yes  Correct Side? Yes  Correct Procedure? Yes  Consents Verified? Yes  Rad Studies Available? N/A  Safety Precautions Reviewed? Yes  Biochemist, clinicalMachine Checks  Machine Number  ([id number])  Station Number 1  UF/Alarm Test Passed  Conductivity: Meter 13.8  Conductivity: Machine  14.1  pH 7.4  Reverse Osmosis main  Normal Saline Lot Number [id number]  Dialyzer Lot Number [id number]  Disposable Set Lot Number [id number]  Machine Temperature 98.6 F (37 C)  ImmunologistAir Detector Armed and Audible Yes  Blood Lines Intact and Secured Yes  Pre Treatment Patient Checks  Vascular access used during treatment Fistula  Hepatitis B Surface Antigen Results Negative  Date Hepatitis B Surface Antigen Drawn 06/08/17  Hepatitis B Surface Antibody  (>10)  Date Hepatitis B Surface Antibody Drawn 06/08/17  Hemodialysis Consent Verified Yes  Hemodialysis Standing Orders Initiated Yes  ECG (Telemetry) Monitor On Yes  Prime Ordered Normal Saline  Length of  DialysisTreatment -hour(s) 3.5 Hour(s)  Dialyzer Elisio 17H NR  Dialysate 3K, 2.5 Ca  Dialysis Anticoagulant None  Dialysate Flow Ordered 600  Blood Flow Rate Ordered 400 mL/min  Ultrafiltration Goal 2 Liters  Dialysis Blood Pressure Support Ordered Normal Saline  Education / Care Plan  Dialysis Education Provided Yes  Documented Education in Care Plan Yes  Fistula / Graft Left Upper arm  Placement  Date/Time: 11/29/17 0900   Placed prior to admission: Yes  Orientation: Left  Access Location: Upper arm  Site Condition No complications  Fistula / Graft Assessment Present;Thrill;Bruit  Drainage Description None

## 2018-05-03 NOTE — Progress Notes (Signed)
2nd attempt to receive report for HD tx, RN off unit, will call back to give report    05/03/18 0935  Hand-Off documentation  Report given to (Full Name) Granville LewisMelissa Arali Somera,RN

## 2018-05-04 ENCOUNTER — Inpatient Hospital Stay: Payer: Medicare (Managed Care)

## 2018-05-04 DIAGNOSIS — R7989 Other specified abnormal findings of blood chemistry: Secondary | ICD-10-CM

## 2018-05-04 DIAGNOSIS — R945 Abnormal results of liver function studies: Secondary | ICD-10-CM

## 2018-05-04 LAB — TROPONIN I: Troponin I: 0.07 ng/mL (ref <0.03)

## 2018-05-04 LAB — GLUCOSE, CAPILLARY
GLUCOSE-CAPILLARY: 159 mg/dL — AB (ref 70–99)
Glucose-Capillary: 135 mg/dL — ABNORMAL HIGH (ref 70–99)
Glucose-Capillary: 212 mg/dL — ABNORMAL HIGH (ref 70–99)
Glucose-Capillary: 102 mg/dL — ABNORMAL HIGH (ref 70–99)

## 2018-05-04 LAB — CK: Total CK: 91 U/L (ref 38–234)

## 2018-05-04 LAB — PROCALCITONIN: PROCALCITONIN: 6.56 ng/mL

## 2018-05-04 MED ORDER — LORAZEPAM 0.5 MG PO TABS
0.5000 mg | ORAL_TABLET | Freq: Every day | ORAL | Status: DC
  Administered 2018-05-04 – 2018-05-05 (×2): 0.5 mg via ORAL
  Filled 2018-05-04 (×2): qty 1

## 2018-05-04 NOTE — Progress Notes (Signed)
Advanced Care Plan.  Purpose of Encounter: CODE STATUS. Parties in Attendance: The patient, her sister and me. Patient's Decisional Capacity: Yes. Medical Story: GloriaBarkeris a67 y.o.femalewith a known history of end-stage renal disease on hemodialysis got 3 L of fluid taken out and eventually patient was dizzy and passed out. Patient has history of diabetes metas, coronary artery disease, chronic respiratory failure home oxygen, COPD, hyperlipidemia and systolic congestive heart failure. Patient is brought into the emergency department and she has received 750 cc of fluid bolus with no significant improvement in the blood pressure.  She was put on no epinephrine and started Midodrin for hypotension.  Blood pressure is still in the low side.  I discussed with the patient and her sister about her current condition, poor prognosis and CODE STATUS.  The patient clearly stated that she still want full code. Plan:  Code Status: Full code. Palliative care consult. Time spent discussing advance care planning: 18 minutes.

## 2018-05-04 NOTE — Care Management Important Message (Signed)
Important Message  Patient Details  Name: Nichole Hurst MRN: [id number] Date of Birth: 06/22/1951   Medicare Important Message Given:  Yes    Chapman FitchBOWEN, Juergen Hardenbrook T, RN 05/04/2018, 4:10 PM

## 2018-05-04 NOTE — Progress Notes (Signed)
[facility]  ROUNDING NOTE   Subjective:   Sister at bedside.   Patient is stating midodrine is giving her anxiety and chest pressure.   Hemodialysis treatment yesterday. Tolerated treatment well. UF of 2 liters. Midodrine was given before hemodialysis treatment.    Objective:  Vital signs in last 24 hours:  Temp:  [97.6 F (36.4 C)-98.8 F (37.1 C)] 98.1 F (36.7 C) (06/27 0905) Pulse Rate:  [80-104] 83 (06/27 0905) Resp:  [13-26] 17 (06/27 0905) BP: (87-145)/(65-110) 100/78 (06/27 0905) SpO2:  [87 %-100 %] 89 % (06/27 0905) Weight:  [67.1 kg (147 lb 14.9 oz)-68.4 kg (150 lb 12.7 oz)] 67.1 kg (147 lb 14.9 oz) (06/26 1958)  Weight change:  Filed Weights   05/01/18 2000 05/03/18 1604 05/03/18 1958  Weight: 65.7 kg (144 lb 13.5 oz) 68.4 kg (150 lb 12.7 oz) 67.1 kg (147 lb 14.9 oz)    Intake/Output: I/O last 3 completed shifts: In: -  Out: 2009 [Other:2009]   Intake/Output this shift:  Total I/O In: 360 [P.O.:360] Out: -   Physical Exam: General: NAD, laying in bed  Head: Normocephalic, atraumatic. Moist oral mucosal membranes  Eyes: Anicteric, PERRL  Neck: Supple, trachea midline  Lungs:  Clear to auscultation, room air  Heart: Regular rate and rhythm  Abdomen:  Soft, nontender,   Extremities: no peripheral edema.  Neurologic: Nonfocal, moving all four extremities  Skin: No lesions  Access: Left AVF    Basic Metabolic Panel: Recent Labs  Lab 05/01/18 1015 05/02/18 0602 05/03/18 0532 05/03/18 1634  NA 134* 136 135 128*  K 4.1 4.8 4.8 4.5  CL 92* 98 98 92*  CO2 27 20* 21* 21*  GLUCOSE 241* 147* 100* 232*  BUN 21* 33* 57* 63*  CREATININE 4.47* 5.80* 7.22* 7.81*  CALCIUM 8.9 8.5* 8.1* 8.1*    Liver Function Tests: Recent Labs  Lab 05/01/18 1015 05/02/18 0602 05/03/18 0532 05/03/18 1634  AST 18 2,610* 3,288* 2,131*  ALT 12* 918* 1,437* 1,335*  ALKPHOS 85 160* 136* 137*  BILITOT 1.0 1.2 1.3* 1.0  PROT 7.7 6.8 6.1* 6.4*  ALBUMIN  3.6 3.1* 2.9* 2.9*   No results for input(s): LIPASE, AMYLASE in the last 168 hours. No results for input(s): AMMONIA in the last 168 hours.  CBC: Recent Labs  Lab 05/01/18 1015 05/02/18 0602 05/03/18 0532  WBC 8.9 14.3* 10.9  NEUTROABS 7.8*  --  8.7*  HGB 11.3* 11.8* 11.1*  HCT 36.0 37.8 34.9*  MCV 88.6 89.9 88.3  PLT 292 261 209    Cardiac Enzymes: Recent Labs  Lab 05/01/18 1015 05/01/18 1215  TROPONINI 0.08* 0.06*    BNP: Invalid input(s): POCBNP  CBG: Recent Labs  Lab 05/02/18 2200 05/03/18 0809 05/03/18 1245 05/03/18 2132 05/04/18 0730  GLUCAP 146* 95 179* 101* 135*    Microbiology: Results for orders placed or performed during the hospital encounter of 05/01/18  MRSA PCR Screening     Status: Abnormal   Collection Time: 05/01/18  8:17 PM  Result Value Ref Range Status   MRSA by PCR POSITIVE (A) NEGATIVE Final    Comment:        The GeneXpert MRSA Assay (FDA approved for NASAL specimens only), is one component of a comprehensive MRSA colonization surveillance program. It is not intended to diagnose MRSA infection nor to guide or monitor treatment for MRSA infections. RESULT CALLED TO, READ BACK BY AND VERIFIED WITH: DALE HOPKINS @2141  05/01/18 AKT Performed at [facility], [id number]  [address] [place], [place] [address]     Coagulation Studies: No results for input(s): LABPROT, INR in the last 72 hours.  Urinalysis: No results for input(s): COLORURINE, LABSPEC, PHURINE, GLUCOSEU, HGBUR, BILIRUBINUR, KETONESUR, PROTEINUR, UROBILINOGEN, NITRITE, LEUKOCYTESUR in the last 72 hours.  Invalid input(s): APPERANCEUR    Imaging: No results found.   Medications:   . cefTRIAXone (ROCEPHIN)  IV Stopped (05/02/18 1929)   . aspirin EC  81 mg Oral QHS  . calcium acetate  1,334 mg Oral TID WC  . Chlorhexidine Gluconate Cloth  6 each Topical Q0600  . docusate sodium  100 mg Oral BID  . dorzolamide-timolol  1 drop Left Eye BID  . ferrous  sulfate  325 mg Oral QHS  . heparin injection (subcutaneous)  5,000 Units Subcutaneous Q8H  . insulin aspart  0-5 Units Subcutaneous QHS  . insulin aspart  0-9 Units Subcutaneous TID WC  . insulin aspart  2 Units Subcutaneous TID WC  . ipratropium-albuterol  3 mL Nebulization Q6H  . latanoprost  1 drop Right Eye QHS  . loratadine  10 mg Oral Daily  . midodrine  5 mg Oral TID WC  . mupirocin ointment  1 application Nasal BID  . ticagrelor  60 mg Oral BID   acetaminophen **OR** acetaminophen, guaiFENesin **AND** dextromethorphan, diphenhydrAMINE, hydrOXYzine, loperamide, nitroGLYCERIN, ondansetron **OR** ondansetron (ZOFRAN) IV, ondansetron  Assessment/ Plan:  [name] is a 67 y.o. white female with end stage renal disease on hemodialysis, hypertension, diabetes mellitus type II, anemia, CVA, pulmonary hypertension, peripheral vascular disease, hyperlipidemia, glaucoma, coronary artery disease, pacemaker placement, systolic congestive heart failure  Admitted to [facility] on 05/01/2018 for hypotension  [facility] MWF [facility] Nephrology   1. Chest Pain: concerning with hypotension - Check 12 lead EKG, CXR and cardiac enzymes  2. End Stage Renal Disease: MWF. Hemodialysis treatment yesterday. Tolerated treatment well. UF of 2 Liters.   3. Hypotension: seems to not be tolerating midodrine. Hold.  - Blood cultures pending.  - Empiric ceftriaxone - Check echocardiogram  4. Anemia of chronic kidney disease: hemoglobin 11.1 - Mircera as outpatient.   5. Secondary Hyperparathyroidism:  - Calcium acetate with meals.   6. Shock Liver: from hypotensive episode. Transaminases starting to trend down after peaking yesterday.    LOS: 3 Desi Rowe 6/27/201910:45 AM

## 2018-05-04 NOTE — Progress Notes (Addendum)
[facility] [place] [facility]   PATIENT NAME: Nichole Hurst    MR#:  [id number]  DATE OF BIRTH:  December 25, 1950  SUBJECTIVE:  CHIEF COMPLAINT:   Chief Complaint  Patient presents with  . Nausea  The patient feels anxious, BP is still low, at 82/53.  REVIEW OF SYSTEMS:  Review of Systems  Constitutional: Positive for malaise/fatigue. Negative for diaphoresis, fever and weight loss.  HENT: Negative for ear discharge, ear pain, hearing loss, nosebleeds, sore throat and tinnitus.   Eyes: Negative for blurred vision and pain.  Respiratory: Negative for cough, hemoptysis, shortness of breath and wheezing.   Cardiovascular: Positive for chest pain. Negative for palpitations, orthopnea and leg swelling.  Gastrointestinal: Negative for abdominal pain, blood in stool, constipation, diarrhea, heartburn, nausea and vomiting.  Genitourinary: Negative for dysuria, frequency and urgency.  Musculoskeletal: Negative for back pain and myalgias.  Skin: Negative for itching and rash.  Neurological: Positive for dizziness. Negative for tingling, tremors, focal weakness, seizures, weakness and headaches.  Psychiatric/Behavioral: Negative for depression. The patient is nervous/anxious.     DRUG ALLERGIES:   Allergies  Allergen Reactions  . Colchicine Anaphylaxis  . Clopidogrel    VITALS:  Blood pressure (!) 82/53, pulse 93, temperature 98.1 F (36.7 C), temperature source Oral, resp. rate 16, height 5\' 1"  (1.549 m), weight 147 lb 14.9 oz (67.1 kg), SpO2 (!) 88 %. PHYSICAL EXAMINATION:  Physical Exam  Constitutional: She is oriented to person, place, and time.  HENT:  Head: Normocephalic and atraumatic.  Eyes: Pupils are equal, round, and reactive to light. Conjunctivae and EOM are normal.  Neck: Normal range of motion. Neck supple. No tracheal deviation present. No thyromegaly present.  Cardiovascular: Normal rate, regular rhythm and normal heart sounds.  Pulmonary/Chest:  Effort normal and breath sounds normal. No respiratory distress. She has no wheezes. She exhibits no tenderness.  Abdominal: Soft. Bowel sounds are normal. She exhibits no distension. There is no tenderness.  Musculoskeletal: Normal range of motion.  Neurological: She is alert and oriented to person, place, and time. No cranial nerve deficit.  Skin: Skin is warm and dry. No rash noted.   LABORATORY PANEL:  Female CBC Recent Labs  Lab 05/03/18 0532  WBC 10.9  HGB 11.1*  HCT 34.9*  PLT 209   ------------------------------------------------------------------------------------------------------------------ Chemistries  Recent Labs  Lab 05/03/18 1634  NA 128*  K 4.5  CL 92*  CO2 21*  GLUCOSE 232*  BUN 63*  CREATININE 7.81*  CALCIUM 8.1*  AST 2,131*  ALT 1,335*  ALKPHOS 137*  BILITOT 1.0   RADIOLOGY:  Dg Chest Port 1 View  Result Date: 05/04/2018 CLINICAL DATA:  Chest pain EXAM: PORTABLE CHEST 1 VIEW COMPARISON:  05/01/2018 FINDINGS: Cardiac shadow is stable. Postsurgical changes are again seen. Pacing device is again noted. The lungs are well aerated bilaterally without focal infiltrate or sizable effusion. No bony abnormality is noted. IMPRESSION: No active disease. Electronically Signed   By: Alcide Clever M.D.   On: 05/04/2018 12:47   ASSESSMENT AND PLAN:  Nichole Hurst  is a 67 y.o. female with a known history of end-stage renal disease on hemodialysis got 3 L of fluid taken out and eventually patient was dizzy and passed out.  Patient has history of diabetes metas, coronary artery disease, COPD, hyperlipidemia and systolic congestive heart failure.  Patient is brought into the emergency department and she has received 750 cc of fluid bolus with no significant improvement in the blood pressure.  During my examination systolic blood pressure is at 76 with a diastolic at 51.  Map at around 55-60.    #syncope with severe hypotension Off norepinephrine. Started on midodrine.  -  not sure if this is due to infection or too much fluid removal at HD Empiric ceftriaxone. Patient was on Midrin, which was discontinued by Dr. Wynelle LinkKolluru due to side effect of anxiety.  Monitor BP but no normal saline bolus per Dr. Wynelle LinkKolluru.  #Acute bronchitis, continue V Rocephin  #End-stage renal disease on hemodialysis  Continue hemodialysis as scheduled.  #Elevated troponin but patient is denying any chest pain: due to demand ischemia Mildly elevated troponin in the setting of end-stage renal disease. The patient's chest pain is possibly due to hypotension and anxiety. Continue aspirin and Brilinta.  #Diabetes mellitus, on sliding scale insulin  #Chronic respiratory failure with COPD,  no exacerbation Nebulizer treatments  #Chronic systolic congestive heart failure with recent ejection fraction 30 to 35% Monitor fluid status with HD.  Repeated chest x-ray is unremarkable.  Abnormal liver function test, Possible due to shock liver. Abdominal ultrasound is unremarkable. liver function is better.  Follow-up liver function test and GI consult.  Discussed with Dr. Wynelle LinkKolluru. All the records are reviewed and case discussed with Care Management/Social Worker. Management plans discussed with the patient, sister and they are in agreement.  CODE STATUS: Full Code  TOTAL TIME TAKING CARE OF THIS PATIENT: 33 minutes.   More than 50% of the time was spent in counseling/coordination of care: YES  POSSIBLE D/C IN 2-3 DAYS, DEPENDING ON CLINICAL CONDITION.   Nichole Hurst M.D on 05/04/2018 at 2:54 PM  Between 7am to 6pm - Pager - [phone]  After 6pm go to [email or web address] - Scientist, research (life [email or web address])password [email or web address]  [facility]  Office  [phone]  CC: Primary care physician; Inc, [facility]  Note: This dictation was prepared with Nurse, children'sDragon dictation along with smaller Lobbyistphrase technology. Any transcriptional errors that result from this process are unintentional.

## 2018-05-04 NOTE — Consult Note (Signed)
Midge Minium, MD [facility]  [address], [address] [place], [place] [address] Phone: [phone] Fax : [phone]  Consultation  Referring Provider:     Dr. Imogene Burn Primary Care Physician:  [facility] Primary Gastroenterologist:  Gentry Fitz         Reason for Consultation:     Abnormal liver enzymes  Date of Admission:  05/01/2018 Date of Consultation:  05/04/2018         HPI:   Nichole Hurst is a 67 y.o. female who is admitted after syncopal episode on the 24th of this month. The patient has a history of end-stage renal disease and was on hemodialysis and felt dizzy and passed out.  The patient was admitted with normal liver enzymes and repeat studies showed them to have elevated.  The patient was given 750 cc of fluid in the ER with improvement of her blood pressure.  The patient's systolic blood pressure in the ER was 76 with a diastolic of 51. On the 25th after admission the patient had liver enzymes that went up to an AST of 2610 with an ALT of 918 and a normal bilirubin with the following morning having the AST go up to 3288 with the ALT of 1437.  12 hours later the liver enzymes had gone down to an AST of 2131 and an ALT of 1335.  The patient's bilirubin was 1.0 at that time.  She did not have any liver enzymes checked today.  I'm now being asked to see this patient for the abnormal liver enzymes.  She denies any out call abuse, IV drug use, history of abnormal liver enzymes or family history of liver problems.  Past Medical History:  Diagnosis Date  . Anemia of chronic disease   . CAD (coronary artery disease)    a. s/p 2-V CABG 05/2016 (LIMA-LAD, VG-OM1)  . Carotid stenosis   . Charcot's gait    FOOT  . Claustrophobia   . Colon polyp   . Diabetes mellitus with complication (HCC)   . Emphysema/COPD (HCC)   . ESRD (end stage renal disease) on dialysis (HCC)    DIAYLISIS M/W/F  . Fractures    Lt foot  . GERD (gastroesophageal reflux disease)   . Glaucoma   .  History of kidney stones   . Hyperlipidemia   . Hypertension   . Migraine   . Orthopnea   . Oxygen deficiency    AS NEEDED  . PAD (peripheral artery disease) (HCC)   . Pain    JOINTS  . Presence of permanent cardiac pacemaker    [employer]. Jude   . Pulmonary hypertension (HCC)    a. TTE 10/18: EF 55-60%, no RWMA, Gr2DD, mild to moderate MR, mildly dilated left atrium, mildly dilated RV with normal RVSF, mildly dilated RA, severe TR, PASP 60-65 mmHg  . Retinopathy    DIABETIC  . Stroke [facility]) 09/2015   Ischemic stroke with R sided hemiparesis    Past Surgical History:  Procedure Laterality Date  . ABDOMINAL HYSTERECTOMY    . AMPUTATION TOE Left 11/29/2017   Procedure: AMPUTATION TOE-LEFT GREAT TOE;  Surgeon: Linus Galas, DPM;  Location: [facility] ORS;  Service: Podiatry;  Laterality: Left;  . AV FISTULA PLACEMENT     LEFT ARM  . CATARACT EXTRACTION W/PHACO Right 08/25/2016   Procedure: CATARACT EXTRACTION PHACO AND INTRAOCULAR LENS PLACEMENT (IOC) AND ANTERIOR VITRECTOMY;  Surgeon: Sallee Lange, MD;  Location: [facility] ORS;  Service: Ophthalmology;  Laterality: Right;  FLUID  CASSETTE [id number] H, EXP 01/05/18  . CHOLECYSTECTOMY    . CORONARY ARTERY BYPASS GRAFT     05/13/2016  . INSERT / REPLACE / REMOVE PACEMAKER     6/17  . INSERTION OF AHMED VALVE Left 08/04/2017   Procedure: INSERTION OF AHMED VALVE;  Surgeon: Lockie MolaBrasington, Chadwick, MD;  Location: [facility] ORS;  Service: Ophthalmology;  Laterality: Left;  . LOWER EXTREMITY ANGIOGRAPHY Left 06/13/2017   Procedure: Lower Extremity Angiography;  Surgeon: Annice Needyew, Jason S, MD;  Location: [facility] INVASIVE CV LAB;  Service: Cardiovascular;  Laterality: Left;  . PACEMAKER IMPLANT    . PERIPHERAL VASCULAR CATHETERIZATION Left 04/08/2016   Procedure: A/V Shuntogram/Fistulagram;  Surgeon: Annice NeedyJason S Dew, MD;  Location: [facility] INVASIVE CV LAB;  Service: Cardiovascular;  Laterality: Left;  . PERIPHERAL VASCULAR CATHETERIZATION N/A 04/08/2016   Procedure: A/V Shunt  Intervention;  Surgeon: Annice NeedyJason S Dew, MD;  Location: [facility] INVASIVE CV LAB;  Service: Cardiovascular;  Laterality: N/A;  . RIGHT/LEFT HEART CATH AND CORONARY ANGIOGRAPHY N/A 02/14/2018   Procedure: RIGHT/LEFT HEART CATH AND CORONARY/graft  ANGIOGRAPHY;  Surgeon: Yvonne KendallEnd, Christopher, MD;  Location: [facility] INVASIVE CV LAB;  Service: Cardiovascular;  Laterality: N/A;  . STONES     KIDNEY  . TONSILLECTOMY    . TUBAL LIGATION      Prior to Admission medications   Medication Sig Start Date End Date Taking? Authorizing Provider  aspirin EC 81 MG tablet Take 81 mg by mouth at bedtime.   Yes [provider]  atorvastatin (LIPITOR) 80 MG tablet Take 80 mg by mouth every evening.   Yes [provider]  calcium acetate (PHOSLO) 667 MG capsule Take 1,334 mg by mouth 3 (three) times daily with meals.   Yes [provider]  camphor-menthol Wynelle Fanny(SARNA) lotion Apply 1 application topically 3 (three) times daily as needed for itching.   Yes [provider]  cholecalciferol (VITAMIN D) 1000 units tablet Take 1,000 Units by mouth daily.   Yes [provider]  dextromethorphan-guaiFENesin (MUCINEX DM) 30-600 MG 12hr tablet Take 1 tablet by mouth 2 (two) times daily as needed for cough.   Yes [provider]  diphenhydrAMINE (BENADRYL) 25 mg capsule Take 25 mg by mouth every 4 (four) hours as needed for itching.    Yes [provider]  dorzolamide-timolol (COSOPT) 22.3-6.8 MG/ML ophthalmic solution Place 1 drop into the left eye 2 (two) times daily.   Yes [provider]  Ferrous Sulfate 140 (45 Fe) MG TBCR Take 1 tablet by mouth at bedtime.    Yes [provider]  hydrOXYzine (ATARAX/VISTARIL) 25 MG tablet Take 12.5 mg by mouth daily as needed for anxiety.   Yes [provider]  Ipratropium-Albuterol (COMBIVENT) 20-100 MCG/ACT AERS respimat Inhale 2 puffs into the lungs every 6 (six) hours as needed for wheezing or shortness of breath.     Yes [provider]  ipratropium-albuterol (DUONEB) 0.5-2.5 (3) MG/3ML SOLN Take 3 mLs by nebulization. Every 4 to 6 hours as needed for shortness of breath   Yes [provider]  isosorbide mononitrate (IMDUR) 30 MG 24 hr tablet Take 0.5 tablets (15 mg total) by mouth daily. 02/17/18  Yes Pyreddy, Vivien RotaPavan, MD  latanoprost (XALATAN) 0.005 % ophthalmic solution Place 1 drop into the right eye at bedtime.   Yes [provider]  loperamide (IMODIUM) 2 MG capsule Take 2 mg by mouth 4 (four) times daily as needed for diarrhea or loose stools.    Yes [provider]  loratadine (CLARITIN)  10 MG tablet Take 10 mg by mouth daily. 04/25/18 05/02/18 Yes [provider]  losartan (COZAAR) 50 MG tablet Take 50 mg by mouth at bedtime.    Yes [provider]  metoprolol succinate (TOPROL-XL) 50 MG 24 hr tablet Take 1 tablet (50 mg total) by mouth 2 (two) times daily. Take with or immediately following a meal. Patient taking differently: Take 25 mg by mouth daily. Take with or immediately following a meal. 02/17/18  Yes Pyreddy, Vivien Rota, MD  nitroGLYCERIN (NITROSTAT) 0.4 MG SL tablet Place 0.4 mg under the tongue every 5 (five) minutes as needed for chest pain.   Yes [provider]  ondansetron (ZOFRAN) 4 MG tablet Take 4 mg by mouth every 8 (eight) hours as needed for nausea or vomiting.   Yes [provider]  ticagrelor (BRILINTA) 60 MG TABS tablet Take 60 mg by mouth 2 (two) times daily.    Yes [provider]  torsemide (DEMADEX) 100 MG tablet Take 1 tablet (100 mg total) by mouth daily. Patient to take medication only on non dialysis days 02/17/18  Yes Pyreddy, Vivien Rota, MD  irbesartan (AVAPRO) 150 MG tablet Take 1 tablet (150 mg total) by mouth at bedtime. Patient not taking: Reported on 05/01/2018 08/09/17   Enedina Finner, MD  predniSONE (DELTASONE) 10 MG tablet Take 1 tablet (10 mg total) by mouth daily with breakfast. Label  & dispense  according to the schedule below.  6 tablets day one, then 5 table day 2, then 4 tablets day 3, then 3 tablets day 4, 2 tablets day 5, then 1 tablet day 6, then stop Patient not taking: Reported on 05/01/2018 02/18/18   Ihor Austin, MD    Family History  Problem Relation Age of Onset  . Diabetes Mother   . Hypertension Mother   . Heart attack Mother      Social History   Tobacco Use  . Smoking status: Former Smoker    Last attempt to quit: 06/10/1983    Years since quitting: 34.9  . Smokeless tobacco: Former Engineer, water Use Topics  . Alcohol use: No  . Drug use: No    Allergies as of 05/01/2018 - Review Complete 05/01/2018  Allergen Reaction Noted  . Colchicine Anaphylaxis 05/31/2017  . Clopidogrel  08/02/2017    Review of Systems:    All systems reviewed and negative except where noted in HPI.   Physical Exam:  Vital signs in last 24 hours: Temp:  [97.6 F (36.4 C)-98.1 F (36.7 C)] 98.1 F (36.7 C) (06/27 0905) Pulse Rate:  [83-93] 93 (06/27 1203) Resp:  [16-17] 16 (06/27 1203) BP: (82-100)/(53-78) 82/53 (06/27 1203) SpO2:  [88 %-94 %] 88 % (06/27 1203) Last BM Date: 05/03/18 General:   Pleasant, cooperative in NAD Head:  Normocephalic and atraumatic. Eyes:   No icterus.   Conjunctiva pink. PERRLA. Ears:  Normal auditory acuity. Neck:  Supple; no masses or thyroidomegaly Lungs: Respirations even and unlabored. Lungs clear to auscultation bilaterally.   No wheezes, crackles, or rhonchi.  Heart:  Regular rate and rhythm;  Without murmur, clicks, rubs or gallops Abdomen:  Soft, nondistended, nontender. Normal bowel sounds. No appreciable masses or hepatomegaly.  No rebound or guarding.  Rectal:  Not performed. Msk:  Symmetrical without gross deformities.    Extremities:  Without edema, cyanosis or clubbing. Neurologic:  Alert and oriented x3;  grossly normal neurologically. Skin:  Intact without significant lesions or rashes. Cervical Nodes:  No significant  cervical adenopathy.  Psych:  Alert and cooperative. Normal affect.  LAB RESULTS: Recent Labs    05/02/18 0602 05/03/18 0532  WBC 14.3* 10.9  HGB 11.8* 11.1*  HCT 37.8 34.9*  PLT 261 209   BMET Recent Labs    05/02/18 0602 05/03/18 0532 05/03/18 1634  NA 136 135 128*  K 4.8 4.8 4.5  CL 98 98 92*  CO2 20* 21* 21*  GLUCOSE 147* 100* 232*  BUN 33* 57* 63*  CREATININE 5.80* 7.22* 7.81*  CALCIUM 8.5* 8.1* 8.1*   LFT Recent Labs    05/03/18 1634  PROT 6.4*  ALBUMIN 2.9*  AST 2,131*  ALT 1,335*  ALKPHOS 137*  BILITOT 1.0   PT/INR No results for input(s): LABPROT, INR in the last 72 hours.  STUDIES: Dg Chest Port 1 View  Result Date: 05/04/2018 CLINICAL DATA:  Chest pain EXAM: PORTABLE CHEST 1 VIEW COMPARISON:  05/01/2018 FINDINGS: Cardiac shadow is stable. Postsurgical changes are again seen. Pacing device is again noted. The lungs are well aerated bilaterally without focal infiltrate or sizable effusion. No bony abnormality is noted. IMPRESSION: No active disease. Electronically Signed   By: Alcide Clever M.D.   On: 05/04/2018 12:47      Impression / Plan:   Assessment: Active Problems:   Hypotension Abnormal liver enzymes  Nichole Hurst is a 67 y.o. y/o female with Liver enzymes with AST over 3000 and ALT over 1000 yesterday.  The patient's liver enzymes checked later that day had dramatically improved.  These findings are consistent with shock liver due to the patient's hypotension. The patient will have a acute hepatitis panel sent off also.  Plan:  I recommend supportive care since the liver enzymes are coming down.  The patient blood pressure should be kept high enough to keep her from hypoperfusing her liver again.  The patient has been explained the plan and agrees with it.  Thank you for involving me in the care of this patient.      LOS: 3 days   Midge Minium, MD  05/04/2018, 9:04 PM    Note: This dictation was prepared with Dragon dictation along  with smaller phrase technology. Any transcriptional errors that result from this process are unintentional.

## 2018-05-05 ENCOUNTER — Inpatient Hospital Stay: Admit: 2018-05-05 | Payer: Medicare (Managed Care)

## 2018-05-05 DIAGNOSIS — I959 Hypotension, unspecified: Secondary | ICD-10-CM

## 2018-05-05 DIAGNOSIS — Z7189 Other specified counseling: Secondary | ICD-10-CM

## 2018-05-05 DIAGNOSIS — R079 Chest pain, unspecified: Secondary | ICD-10-CM

## 2018-05-05 DIAGNOSIS — Z515 Encounter for palliative care: Secondary | ICD-10-CM

## 2018-05-05 LAB — COMPREHENSIVE METABOLIC PANEL
ALBUMIN: 2.9 g/dL — AB (ref 3.5–5.0)
ALK PHOS: 130 U/L — AB (ref 38–126)
ALT: 808 U/L — AB (ref 0–44)
ANION GAP: 14 (ref 5–15)
AST: 551 U/L — ABNORMAL HIGH (ref 15–41)
BILIRUBIN TOTAL: 1.1 mg/dL (ref 0.3–1.2)
BUN: 42 mg/dL — ABNORMAL HIGH (ref 8–23)
CALCIUM: 8.8 mg/dL — AB (ref 8.9–10.3)
CO2: 20 mmol/L — ABNORMAL LOW (ref 22–32)
Chloride: 98 mmol/L (ref 98–111)
Creatinine, Ser: 6.17 mg/dL — ABNORMAL HIGH (ref 0.44–1.00)
GFR calc Af Amer: 7 mL/min — ABNORMAL LOW (ref >60)
GFR, EST NON AFRICAN AMERICAN: 6 mL/min — AB (ref >60)
Glucose, Bld: 161 mg/dL — ABNORMAL HIGH (ref 70–99)
POTASSIUM: 4.9 mmol/L (ref 3.5–5.1)
SODIUM: 132 mmol/L — AB (ref 135–145)
TOTAL PROTEIN: 6 g/dL — AB (ref 6.5–8.1)

## 2018-05-05 LAB — GLUCOSE, CAPILLARY
GLUCOSE-CAPILLARY: 150 mg/dL — AB (ref 70–99)
GLUCOSE-CAPILLARY: 140 mg/dL — AB (ref 70–99)
GLUCOSE-CAPILLARY: 196 mg/dL — AB (ref 70–99)
Glucose-Capillary: 265 mg/dL — ABNORMAL HIGH (ref 70–99)

## 2018-05-05 LAB — CBC
HEMATOCRIT: 31.6 % — AB (ref 35.0–47.0)
HEMOGLOBIN: 10.2 g/dL — AB (ref 12.0–16.0)
MCH: 28.4 pg (ref 26.0–34.0)
MCHC: 32.3 g/dL (ref 32.0–36.0)
MCV: 88 fL (ref 80.0–100.0)
Platelets: 171 10*3/uL (ref 150–440)
RBC: 3.59 MIL/uL — ABNORMAL LOW (ref 3.80–5.20)
RDW: 21.1 % — AB (ref 11.5–14.5)
WBC: 8.4 10*3/uL (ref 3.6–11.0)

## 2018-05-05 MED ORDER — FLUDROCORTISONE ACETATE 0.1 MG PO TABS
0.1000 mg | ORAL_TABLET | Freq: Every day | ORAL | Status: DC
  Administered 2018-05-05 – 2018-05-06 (×2): 0.1 mg via ORAL
  Filled 2018-05-05 (×2): qty 1

## 2018-05-05 MED ORDER — RENA-VITE PO TABS
1.0000 | ORAL_TABLET | Freq: Every day | ORAL | Status: DC
  Administered 2018-05-05: 1 via ORAL
  Filled 2018-05-05: qty 1

## 2018-05-05 MED ORDER — HYDROXYZINE HCL 10 MG/5ML PO SYRP
25.0000 mg | ORAL_SOLUTION | Freq: Once | ORAL | Status: AC
Start: 2018-05-05 — End: 2018-05-05
  Administered 2018-05-05: 25 mg via ORAL
  Filled 2018-05-05: qty 12.5

## 2018-05-05 MED ORDER — IPRATROPIUM-ALBUTEROL 0.5-2.5 (3) MG/3ML IN SOLN
3.0000 mL | Freq: Three times a day (TID) | RESPIRATORY_TRACT | Status: DC
  Administered 2018-05-05 – 2018-05-06 (×3): 3 mL via RESPIRATORY_TRACT
  Filled 2018-05-05 (×3): qty 3

## 2018-05-05 MED ORDER — NEPRO/CARBSTEADY PO LIQD: 237.0000 mL | Freq: Two times a day (BID) | ORAL | Status: DC

## 2018-05-05 MED ORDER — OXYCODONE HCL 5 MG PO TABS
5.0000 mg | ORAL_TABLET | Freq: Two times a day (BID) | ORAL | Status: DC | PRN
  Administered 2018-05-05: 5 mg via ORAL
  Filled 2018-05-05: qty 1

## 2018-05-05 NOTE — Consult Note (Signed)
Consultation Note Date: 05/05/2018   Patient Name: Nichole Hurst  DOB: 1951/04/28  MRN: [id number]  Age / Sex: 67 y.o., female  PCP: [facility] [place] Referring Physician: Demetrios Loll, MD  Reason for Consultation: Establishing goals of care  HPI/Patient Profile: 67 y.o. female admitted on 05/01/2018 from home complaints of dizziness and passed out during dialysis. She has a past medical history of end-stage renal disease (HD M,W,F), diabetes, coronary artery disease, COPD, sCHF, and hyperlipidemia. Patient was brought to ED from dialysis. She was 30 min out from completion. They removed 3L of fluid. Patient reported she had went to the bathroom and had a bowel movement, and afterwards began to feel dizzy and passed out. In the ED she received  750 cc of fluid bolus with no significant improvement in the blood pressure (BP 76/51, Map 55-60).  Initial troponin was slightly elevated but patient denied any chest pain or shortness of breath.  Sister was at bedside.  Chest x-ray negative.  Reporting cough for 1 week which is not getting better.  No fevers. Since admission patient was started on Midodrine, but later discontinued due to anxiety. She has continued with dialysis and being seen by Nephrology. Palliative Medicine team consulted for goals of care discussion.   Clinical Assessment and Goals of Care: I have reviewed medical records including lab results, imaging, Epic notes, and MAR, received report from the bedside RN, and assessed the patient. I then met at the bedside with patient and her sister Nichole Hurst to discuss diagnosis prognosis, [place], EOL wishes, disposition and options. Patient is awake. A&O x4. She is able to engage in goals of care discussion with her sister and I.   I introduced Palliative Medicine as specialized medical care for people living with serious illness. It focuses on providing relief  from the symptoms and stress of a serious illness. The goal is to improve quality of life for both the patient and the family.  We discussed a brief life review of the patient. Patient is retired from [employer]. She states she had to retire due to vision loss and health conditions. She has 3 children and lots of grandchildren. Most of her family lives in [place], where she is from. She states she moved here about 21 years ago. She loves being outside planting flowers, and playing bingo.   As far as functional and nutritional status. She reports she is able to ambulate independently and sometimes uses a wheelchair depending on her breathing. She is on chronic home oxygen at 2.5L/[place]. She states over the past few weeks she has noticed an increase in her fatigue and breathing. She does have PACE services and a home aide that comes in 5 days a week from [place] to assist with ADLs. She states her appetite is good.   We discussed her current illness and what it means in the larger context of her on-going co-morbidities.  Natural disease trajectory and expectations at EOL were discussed. She has been on dialysis for almost 2 years  now. She states she would like to continue dialysis because she knows that is the reason why she is still living and she is not ready to stop.   I attempted to elicit values and goals of care important to the patient.    Advanced directives, concepts specific to code status, artifical feeding and hydration, and rehospitalization were considered and discussed. We discussed in detail what a code situation would look like, after patient began asking detailed questions. She and her sister both states they would like her to continue with aggressive measures and remain a full code. She reports that she cared for her husband until he passed away and she sometimes felt as if he should have been on life support to give him a fighting chance however, he did not want this.   Hospice and  Palliative Care services outpatient were explained and offered. Patient is not a hospice candidate as she would like to continue with aggressive measures including dialysis. She does agree and sister does also that Palliative care services outpatient would be beneficial for her. She is currently under PACE services but does not have Palliative that she is aware of.   Questions and concerns were addressed. Patient and her family was encouraged to call with questions or concerns.  PMT will continue to support holistically.  Primary Decision Maker: Patient is A&O x3. She is capable of making her own health care decisions at this time. If she could not she states her son or her sister would be her decision makers.   NEXT OF KIN    SUMMARY OF RECOMMENDATIONS    FULL CODE-per patient/sister  Continue to treat the treatable. Patient is ok with current conditions and care plan. Would like to continue to treat aggressively with awareness of current co morbidities.   Case Management consult for outpatient Palliative services.   Palliative Medicine team will continue to support patient, family, and medical team during hospitalization.    Code Status/Advance Care Planning:  Full code-at patient/sister's request   Palliative Prophylaxis:   Aspiration, Bowel Regimen, Delirium Protocol, Frequent Pain Assessment and Oral Care  Additional Recommendations (Limitations, Scope, Preferences):  Full Scope Treatment-continue to treat the treatable, including aggressive measures.   Psycho-social/Spiritual:   Desire for further Chaplaincy support:No   Prognosis:   Unable to determine-guarded to poor in the setting of hypotension, pulmonary hypertension, oxygen dependent, COPD, systolic CHF, diabetes, glaucoma, CAD, EF 30-35%, ESRD on dialysis M,W,F, and hyperlipidemia.   Discharge Planning: To Be Determined      Primary Diagnoses: Present on Admission: . Hypotension   I have reviewed the  medical record, interviewed the patient and family, and examined the patient. The following aspects are pertinent.  Past Medical History:  Diagnosis Date  . Anemia of chronic disease   . CAD (coronary artery disease)    a. s/p 2-V CABG 05/2016 (LIMA-LAD, VG-OM1)  . Carotid stenosis   . Charcot's gait    FOOT  . Claustrophobia   . Colon polyp   . Diabetes mellitus with complication ([place])   . Emphysema/COPD ([place])   . ESRD (end stage renal disease) on dialysis ([place])    DIAYLISIS M/W/F  . Fractures    Lt foot  . GERD (gastroesophageal reflux disease)   . Glaucoma   . History of kidney stones   . Hyperlipidemia   . Hypertension   . Migraine   . Orthopnea   . Oxygen deficiency    AS NEEDED  . PAD (peripheral artery  disease) ([place])   . Pain    JOINTS  . Presence of permanent cardiac pacemaker    [employer]   . Pulmonary hypertension ([place])    a. TTE 10/18: EF 55-60%, no RWMA, Gr2DD, mild to moderate MR, mildly dilated left atrium, mildly dilated RV with normal RVSF, mildly dilated RA, severe TR, PASP 60-65 mmHg  . Retinopathy    DIABETIC  . Stroke [facility]) 09/2015   Ischemic stroke with R sided hemiparesis   Social History   Socioeconomic History  . Marital status: Divorced    Spouse name: Not on file  . Number of children: Not on file  . Years of education: Not on file  . Highest education level: Not on file  Occupational History  . Not on file  Social Needs  . Financial resource strain: Not on file  . Food insecurity:    Worry: Not on file    Inability: Not on file  . Transportation needs:    Medical: Not on file    Non-medical: Not on file  Tobacco Use  . Smoking status: Former Smoker    Last attempt to quit: 06/10/1983    Years since quitting: 34.9  . Smokeless tobacco: Former Network engineer and Sexual Activity  . Alcohol use: No  . Drug use: No  . Sexual activity: Not on file  Lifestyle  . Physical activity:    Days per week: Not on file    Minutes per  session: Not on file  . Stress: Not on file  Relationships  . Social connections:    Talks on phone: Not on file    Gets together: Not on file    Attends religious service: Not on file    Active member of club or organization: Not on file    Attends meetings of clubs or organizations: Not on file    Relationship status: Not on file  Other Topics Concern  . Not on file  Social History Narrative  . Not on file   Family History  Problem Relation Age of Onset  . Diabetes Mother   . Hypertension Mother   . Heart attack Mother    Scheduled Meds: . aspirin EC  81 mg Oral QHS  . calcium acetate  1,334 mg Oral TID WC  . docusate sodium  100 mg Oral BID  . dorzolamide-timolol  1 drop Left Eye BID  . feeding supplement (NEPRO CARB STEADY)  237 mL Oral BID BM  . ferrous sulfate  325 mg Oral QHS  . fludrocortisone  0.1 mg Oral Daily  . heparin injection (subcutaneous)  5,000 Units Subcutaneous Q8H  . insulin aspart  0-5 Units Subcutaneous QHS  . insulin aspart  0-9 Units Subcutaneous TID WC  . insulin aspart  2 Units Subcutaneous TID WC  . ipratropium-albuterol  3 mL Nebulization Q6H  . latanoprost  1 drop Right Eye QHS  . loratadine  10 mg Oral Daily  . LORazepam  0.5 mg Oral QHS  . multivitamin  1 tablet Oral QHS  . mupirocin ointment  1 application Nasal BID  . ticagrelor  60 mg Oral BID   Continuous Infusions: PRN Meds:.acetaminophen **OR** acetaminophen, guaiFENesin **AND** dextromethorphan, diphenhydrAMINE, hydrOXYzine, loperamide, nitroGLYCERIN, ondansetron **OR** ondansetron (ZOFRAN) IV, ondansetron Medications Prior to Admission:  Prior to Admission medications   Medication Sig Start Date End Date Taking? Authorizing Provider  aspirin EC 81 MG tablet Take 81 mg by mouth at bedtime.   Yes [provider]  atorvastatin (LIPITOR) 80 MG tablet Take 80 mg by mouth every evening.   Yes [provider]  calcium acetate (PHOSLO) 667 MG capsule Take 1,334 mg by  mouth 3 (three) times daily with meals.   Yes [provider]  camphor-menthol Timoteo Ace) lotion Apply 1 application topically 3 (three) times daily as needed for itching.   Yes [provider]  cholecalciferol (VITAMIN D) 1000 units tablet Take 1,000 Units by mouth daily.   Yes [provider]  dextromethorphan-guaiFENesin (MUCINEX DM) 30-600 MG 12hr tablet Take 1 tablet by mouth 2 (two) times daily as needed for cough.   Yes [provider]  diphenhydrAMINE (BENADRYL) 25 mg capsule Take 25 mg by mouth every 4 (four) hours as needed for itching.    Yes [provider]  dorzolamide-timolol (COSOPT) 22.3-6.8 MG/ML ophthalmic solution Place 1 drop into the left eye 2 (two) times daily.   Yes [provider]  Ferrous Sulfate 140 (45 Fe) MG TBCR Take 1 tablet by mouth at bedtime.    Yes [provider]  hydrOXYzine (ATARAX/VISTARIL) 25 MG tablet Take 12.5 mg by mouth daily as needed for anxiety.   Yes [provider]  Ipratropium-Albuterol (COMBIVENT) 20-100 MCG/ACT AERS respimat Inhale 2 puffs into the lungs every 6 (six) hours as needed for wheezing or shortness of breath.    Yes [provider]  ipratropium-albuterol (DUONEB) 0.5-2.5 (3) MG/3ML SOLN Take 3 mLs by nebulization. Every 4 to 6 hours as needed for shortness of breath   Yes [provider]  isosorbide mononitrate (IMDUR) 30 MG 24 hr tablet Take 0.5 tablets (15 mg total) by mouth daily. 02/17/18  Yes Pyreddy, Reatha Harps, MD  latanoprost (XALATAN) 0.005 % ophthalmic solution Place 1 drop into the right eye at bedtime.   Yes [provider]  loperamide (IMODIUM) 2 MG capsule Take 2 mg by mouth 4 (four) times daily as needed for diarrhea or loose stools.    Yes [provider]  loratadine (CLARITIN) 10 MG tablet Take 10 mg by mouth daily. 04/25/18 05/02/18 Yes [provider]  losartan (COZAAR) 50 MG tablet Take 50 mg by mouth at bedtime.     Yes [provider]  metoprolol succinate (TOPROL-XL) 50 MG 24 hr tablet Take 1 tablet (50 mg total) by mouth 2 (two) times daily. Take with or immediately following a meal. Patient taking differently: Take 25 mg by mouth daily. Take with or immediately following a meal. 02/17/18  Yes Pyreddy, Reatha Harps, MD  nitroGLYCERIN (NITROSTAT) 0.4 MG SL tablet Place 0.4 mg under the tongue every 5 (five) minutes as needed for chest pain.   Yes [provider]  ondansetron (ZOFRAN) 4 MG tablet Take 4 mg by mouth every 8 (eight) hours as needed for nausea or vomiting.   Yes [provider]  ticagrelor (BRILINTA) 60 MG TABS tablet Take 60 mg by mouth 2 (two) times daily.    Yes [provider]  torsemide (DEMADEX) 100 MG tablet Take 1 tablet (100 mg total) by mouth daily. Patient to take medication only on non dialysis days 02/17/18  Yes Pyreddy, Reatha Harps, MD  irbesartan (AVAPRO) 150 MG tablet Take 1 tablet (150 mg total) by mouth at bedtime. Patient not taking: Reported on 05/01/2018 08/09/17   Fritzi Mandes, MD  predniSONE (DELTASONE) 10 MG tablet Take 1 tablet (10 mg total) by mouth daily with breakfast. Label  & dispense according to the schedule below.  6 tablets day one, then 5 table  day 2, then 4 tablets day 3, then 3 tablets day 4, 2 tablets day 5, then 1 tablet day 6, then stop Patient not taking: Reported on 05/01/2018 02/18/18   Saundra Shelling, MD   Allergies  Allergen Reactions  . Colchicine Anaphylaxis  . Clopidogrel    Review of Systems  Constitutional: Positive for fatigue.  Psychiatric/Behavioral: The patient is nervous/anxious.   All other systems reviewed and are negative.   Physical Exam  Constitutional: She is oriented to person, place, and time. She appears well-developed. She is cooperative.  Cardiovascular: Normal rate, regular rhythm, normal heart sounds and normal pulses.  Pulmonary/Chest: Effort normal. She has decreased breath sounds.  Abdominal: Normal  appearance and bowel sounds are normal.  Neurological: She is alert and oriented to person, place, and time.  Skin: Skin is warm and dry. Bruising noted.  Psychiatric: Her speech is normal and behavior is normal. Judgment and thought content normal. Her mood appears anxious.  Nursing note and vitals reviewed.   Vital Signs: BP (!) 108/96   Pulse 89   Temp 97.8 F (36.6 C)   Resp 13   Ht 5' 1"  (1.549 m)   Wt 67.1 kg (147 lb 14.9 oz)   SpO2 100%   BMI 27.95 kg/m  Pain Scale: 0-10   Pain Score: 0-No pain   SpO2: SpO2: 100 % O2 Device:SpO2: 100 % O2 Flow Rate: .O2 Flow Rate (L/min): 2 L/min  IO: Intake/output summary:   Intake/Output Summary (Last 24 hours) at 05/05/2018 1424 Last data filed at 05/05/2018 1305 Gross per 24 hour  Intake 100 ml  Output 1000 ml  Net -900 ml    LBM: Last BM Date: 05/05/18 Baseline Weight: Weight: 65.8 kg (145 lb) Most recent weight: Weight: 67.1 kg (147 lb 14.9 oz)     Palliative Assessment/Data:PPS 50%   Time In: 1345 Time Out: 1500 Time Total: 75 Min Greater than 50%  of this time was spent counseling and coordinating care related to the above assessment and plan.  Signed by:  Alda Lea, NP-BC Palliative Medicine Team  Phone: [phone] Fax: [phone] Pager: [phone] Amion: Bjorn Pippin    Please contact Palliative Medicine Team phone at [phone] for questions and concerns.  For individual provider: See Shea Evans

## 2018-05-05 NOTE — Progress Notes (Addendum)
[facility]  ROUNDING NOTE   Subjective:   Patient is complaining of anxiety and itching. She states it is due to the midodrine.  Reports that dialysis went well on Wednesday UF of 2L.  Also reports that her current IV is painful.    Objective:  Vital signs in last 24 hours:  Temp:  [97.5 F (36.4 C)-97.9 F (36.6 C)] 97.9 F (36.6 C) (06/28 0529) Pulse Rate:  [38-101] 86 (06/28 0531) Resp:  [16-22] 18 (06/28 0529) BP: (73-109)/(53-70) 84/62 (06/28 0531) SpO2:  [88 %-97 %] 96 % (06/28 0844)  Weight change:  Filed Weights   05/01/18 2000 05/03/18 1604 05/03/18 1958  Weight: 65.7 kg (144 lb 13.5 oz) 68.4 kg (150 lb 12.7 oz) 67.1 kg (147 lb 14.9 oz)    Intake/Output: I/O last 3 completed shifts: In: 460 [P.O.:360; IV Piggyback:100] Out: 2009 [Other:2009]   Intake/Output this shift:  No intake/output data recorded.  Physical Exam: General: NAD, Alert and Oriented. Sitting up in bed, appears anxious   Head: Normocephalic, atraumatic. Moist oral mucosal membranes  Eyes: Anicteric, PERRL  Neck: Supple, trachea midline  Lungs:  Clear to auscultation  Heart: Regular rate and rhythm  Abdomen:  Soft, nontender,   Extremities:  No peripheral edema.  Neurologic: Nonfocal, moving all four extremities  Skin: No lesions  Access: Left AVF    Basic Metabolic Panel: Recent Labs  Lab 05/01/18 1015 05/02/18 0602 05/03/18 0532 05/03/18 1634 05/05/18 0500  NA 134* 136 135 128* 132*  K 4.1 4.8 4.8 4.5 4.9  CL 92* 98 98 92* 98  CO2 27 20* 21* 21* 20*  GLUCOSE 241* 147* 100* 232* 161*  BUN 21* 33* 57* 63* 42*  CREATININE 4.47* 5.80* 7.22* 7.81* 6.17*  CALCIUM 8.9 8.5* 8.1* 8.1* 8.8*    Liver Function Tests: Recent Labs  Lab 05/01/18 1015 05/02/18 0602 05/03/18 0532 05/03/18 1634 05/05/18 0500  AST 18 2,610* 3,288* 2,131* 551*  ALT 12* 918* 1,437* 1,335* 808*  ALKPHOS 85 160* 136* 137* 130*  BILITOT 1.0 1.2 1.3* 1.0 1.1  PROT 7.7 6.8 6.1* 6.4* 6.0*   ALBUMIN 3.6 3.1* 2.9* 2.9* 2.9*   No results for input(s): LIPASE, AMYLASE in the last 168 hours. No results for input(s): AMMONIA in the last 168 hours.  CBC: Recent Labs  Lab 05/01/18 1015 05/02/18 0602 05/03/18 0532  WBC 8.9 14.3* 10.9  NEUTROABS 7.8*  --  8.7*  HGB 11.3* 11.8* 11.1*  HCT 36.0 37.8 34.9*  MCV 88.6 89.9 88.3  PLT 292 261 209    Cardiac Enzymes: Recent Labs  Lab 05/01/18 1015 05/01/18 1215 05/04/18 0757  CKTOTAL  --   --  91  TROPONINI 0.08* 0.06* 0.07*    BNP: Invalid input(s): POCBNP  CBG: Recent Labs  Lab 05/04/18 0730 05/04/18 1201 05/04/18 1704 05/04/18 2119 05/05/18 0743  GLUCAP 135* 212* 102* 159* 150*    Microbiology: Results for orders placed or performed during the hospital encounter of 05/01/18  MRSA PCR Screening     Status: Abnormal   Collection Time: 05/01/18  8:17 PM  Result Value Ref Range Status   MRSA by PCR POSITIVE (A) NEGATIVE Final    Comment:        The GeneXpert MRSA Assay (FDA approved for NASAL specimens only), is one component of a comprehensive MRSA colonization surveillance program. It is not intended to diagnose MRSA infection nor to guide or monitor treatment for MRSA infections. RESULT CALLED TO, READ BACK BY  AND VERIFIED WITH: DALE HOPKINS @2141  05/01/18 AKT Performed at [facility], [address] [place], [place] [address]   CULTURE, BLOOD (ROUTINE X 2) w Reflex to ID Panel     Status: None (Preliminary result)   Collection Time: 05/04/18  7:57 AM  Result Value Ref Range Status   Specimen Description BLOOD BLOOD RIGHT HAND  Final   Special Requests   Final    BOTTLES DRAWN AEROBIC AND ANAEROBIC Blood Culture adequate volume   Culture   Final    NO GROWTH < 24 HOURS Performed at [facility], [address] [place], [place] [address]    Report Status PENDING  Incomplete    Coagulation Studies: No results for input(s): LABPROT, INR in the last 72  hours.  Urinalysis: No results for input(s): COLORURINE, LABSPEC, PHURINE, GLUCOSEU, HGBUR, BILIRUBINUR, KETONESUR, PROTEINUR, UROBILINOGEN, NITRITE, LEUKOCYTESUR in the last 72 hours.  Invalid input(s): APPERANCEUR    Imaging: Dg Chest Port 1 View  Result Date: 05/04/2018 CLINICAL DATA:  Chest pain EXAM: PORTABLE CHEST 1 VIEW COMPARISON:  05/01/2018 FINDINGS: Cardiac shadow is stable. Postsurgical changes are again seen. Pacing device is again noted. The lungs are well aerated bilaterally without focal infiltrate or sizable effusion. No bony abnormality is noted. IMPRESSION: No active disease. Electronically Signed   By: Alcide CleverMark  Lukens M.D.   On: 05/04/2018 12:47     Medications:   . cefTRIAXone (ROCEPHIN)  IV Stopped (05/04/18 1801)   . aspirin EC  81 mg Oral QHS  . calcium acetate  1,334 mg Oral TID WC  . docusate sodium  100 mg Oral BID  . dorzolamide-timolol  1 drop Left Eye BID  . ferrous sulfate  325 mg Oral QHS  . heparin injection (subcutaneous)  5,000 Units Subcutaneous Q8H  . insulin aspart  0-5 Units Subcutaneous QHS  . insulin aspart  0-9 Units Subcutaneous TID WC  . insulin aspart  2 Units Subcutaneous TID WC  . ipratropium-albuterol  3 mL Nebulization Q6H  . latanoprost  1 drop Right Eye QHS  . loratadine  10 mg Oral Daily  . LORazepam  0.5 mg Oral QHS  . mupirocin ointment  1 application Nasal BID  . ticagrelor  60 mg Oral BID   acetaminophen **OR** acetaminophen, guaiFENesin **AND** dextromethorphan, diphenhydrAMINE, hydrOXYzine, loperamide, nitroGLYCERIN, ondansetron **OR** ondansetron (ZOFRAN) IV, ondansetron  Assessment/ Plan:  Nichole Hurst is a 67 y.o. white  female with end stage renal disease on hemodialysis, hypertension, diabetes mellitus type II, anemia, CVA, pulmonary hypertension, peripheral vascular disease, hyperlipidemia, glaucoma, coronary artery disease, pacemaker placement, systolic congestive heart failure admitted on 6/24 for hypotension.    [facility]   1. Chest Pain: concerning with hypotension - Check 12 lead EKG, CXR and cardiac enzymes -CXR shows no active disease - Echo ordered by Dr. Wynelle LinkKolluru.   2. End Stage Renal Disease: MWF.  - Hemodialysis is scheduled for today.   3. Hypotension: seems to not be tolerating midodrine.  -D/C Midodrine. Gives patient chest pain and anxiety   - Blood cultures pending. No growth at 24 hours   - Empiric ceftriaxone has been stopped  - echocardiogram ordered by Dr. Wynelle LinkKolluru   4. Anemia of chronic kidney disease: hemoglobin 11.1 - [facility] as outpatient.   5. Secondary Hyperparathyroidism:  - Calcium acetate with meals.   6. Shock Liver: from hypotensive episode. - Seen by GI - Hepatitis panel is pending - Transaminases starting to trend down AST 551,  ALT 808 on 6/28.   LOS: 4 Nichole Hurst 6/28/20199:17 AM  Patient seen and examined with physician assistant. Agree with above plan.   Nichole Hurst 6/28/20195:01 PM

## 2018-05-05 NOTE — Care Management (Signed)
RNCM notified Robin [facility] transition RN that there is a recommendation for outpatient palliative to follow.  Zella BallRobin states she will notify the provider, and if it is determined it is indicated they will arrange it outpatient. Per Zella BallRobin if patient discharges over the weekend her sister will be able to transport at discharge.  [facility] to utilize  at discharge.

## 2018-05-05 NOTE — Progress Notes (Signed)
This note also relates to the following rows which could not be included: Pulse Rate - Cannot attach notes to unvalidated device data Resp - Cannot attach notes to unvalidated device data SpO2 - Cannot attach notes to unvalidated device data  Hd started  

## 2018-05-05 NOTE — Progress Notes (Signed)
Nichole Minium, MD [facility]   [address], [address] [place], [place] [address] Phone: [phone] Fax : [phone]   Subjective: Have low blood pressure but her liver enzymes are dramatically decreasing every day.  She has no complaints at the present time.  The patient likely had abnormal liver enzymes due to her hypotension.   Objective: Vital signs in last 24 hours: Vitals:   05/05/18 1245 05/05/18 1300 05/05/18 1305 05/05/18 1315  BP: 99/86 (!) 70/60  (!) 108/96  Pulse: 89 64 87 89  Resp: (!) 21 20 17 13   Temp:   97.8 F (36.6 C)   TempSrc:      SpO2:      Weight:      Height:       Weight change:   Intake/Output Summary (Last 24 hours) at 05/05/2018 1453 Last data filed at 05/05/2018 1305 Gross per 24 hour  Intake 100 ml  Output 1000 ml  Net -900 ml     Exam: Heart:: Regular rate and rhythm, S1S2 present or without murmur or extra heart sounds Lungs: normal and clear to auscultation and percussion Abdomen: soft, nontender, normal bowel sounds   Lab Results: @LABTEST2 @ Micro Results: Recent Results (from the past 240 hour(s))  MRSA PCR Screening     Status: Abnormal   Collection Time: 05/01/18  8:17 PM  Result Value Ref Range Status   MRSA by PCR POSITIVE (A) NEGATIVE Final    Comment:        The GeneXpert MRSA Assay (FDA approved for NASAL specimens only), is one component of a comprehensive MRSA colonization surveillance program. It is not intended to diagnose MRSA infection nor to guide or monitor treatment for MRSA infections. RESULT CALLED TO, READ BACK BY AND VERIFIED WITH: DALE HOPKINS @2141  05/01/18 AKT Performed at [facility], [address] [place], [place] [address]   CULTURE, BLOOD (ROUTINE X 2) w Reflex to ID Panel     Status: None (Preliminary result)   Collection Time: 05/04/18  7:57 AM  Result Value Ref Range Status   Specimen Description BLOOD BLOOD RIGHT HAND  Final   Special Requests   Final    BOTTLES DRAWN AEROBIC  AND ANAEROBIC Blood Culture adequate volume   Culture   Final    NO GROWTH < 24 HOURS Performed at [facility], [address], [place], [place] [address]    Report Status PENDING  Incomplete   Studies/Results: Dg Chest Port 1 View  Result Date: 05/04/2018 CLINICAL DATA:  Chest pain EXAM: PORTABLE CHEST 1 VIEW COMPARISON:  05/01/2018 FINDINGS: Cardiac shadow is stable. Postsurgical changes are again seen. Pacing device is again noted. The lungs are well aerated bilaterally without focal infiltrate or sizable effusion. No bony abnormality is noted. IMPRESSION: No active disease. Electronically Signed   By: Alcide Clever M.D.   On: 05/04/2018 12:47   Medications: I have reviewed the patient's current medications. Scheduled Meds: . aspirin EC  81 mg Oral QHS  . calcium acetate  1,334 mg Oral TID WC  . docusate sodium  100 mg Oral BID  . dorzolamide-timolol  1 drop Left Eye BID  . feeding supplement (NEPRO CARB STEADY)  237 mL Oral BID BM  . ferrous sulfate  325 mg Oral QHS  . fludrocortisone  0.1 mg Oral Daily  . heparin injection (subcutaneous)  5,000 Units Subcutaneous Q8H  . insulin aspart  0-5 Units Subcutaneous QHS  . insulin aspart  0-9 Units Subcutaneous TID WC  .  insulin aspart  2 Units Subcutaneous TID WC  . ipratropium-albuterol  3 mL Nebulization Q6H  . latanoprost  1 drop Right Eye QHS  . loratadine  10 mg Oral Daily  . LORazepam  0.5 mg Oral QHS  . multivitamin  1 tablet Oral QHS  . mupirocin ointment  1 application Nasal BID  . ticagrelor  60 mg Oral BID   Continuous Infusions: PRN Meds:.acetaminophen **OR** acetaminophen, guaiFENesin **AND** dextromethorphan, diphenhydrAMINE, hydrOXYzine, loperamide, nitroGLYCERIN, ondansetron **OR** ondansetron (ZOFRAN) IV, ondansetron   Assessment: Active Problems:   Hypotension   Elevated LFTs    Plan: The patient has had abnormal liver enzymes with symptoms and signs consistent with shock liver.  Do from a GI  point of view.  Please contact us if her liver enzymes should increase again.   LOS: 4 days   Nichole MiniumDarren Remus Hagedorn 05/05/2018, 2:53 PM

## 2018-05-05 NOTE — Progress Notes (Signed)
[employer] - [place] at [facility]   PATIENT NAME: Nichole Hurst    MR#:  [id number]  DATE OF BIRTH:  January 26, 1951  SUBJECTIVE:  CHIEF COMPLAINT:   Chief Complaint  Patient presents with  . Nausea  The patient feels anxious, BP is still low at 80's.  She got Midodrine in the morning and started to have itching.  She was given Benadryl. REVIEW OF SYSTEMS:  Review of Systems  Constitutional: Positive for malaise/fatigue. Negative for diaphoresis, fever and weight loss.  HENT: Negative for ear discharge, ear pain, hearing loss, nosebleeds, sore throat and tinnitus.   Eyes: Negative for blurred vision and pain.  Respiratory: Negative for cough, hemoptysis, shortness of breath and wheezing.   Cardiovascular: Positive for chest pain. Negative for palpitations, orthopnea and leg swelling.  Gastrointestinal: Negative for abdominal pain, blood in stool, constipation, diarrhea, heartburn, nausea and vomiting.  Genitourinary: Negative for dysuria, frequency and urgency.  Musculoskeletal: Negative for back pain and myalgias.  Skin: Negative for itching and rash.  Neurological: Positive for dizziness. Negative for tingling, tremors, focal weakness, seizures, weakness and headaches.  Psychiatric/Behavioral: Negative for depression. The patient is nervous/anxious.     DRUG ALLERGIES:   Allergies  Allergen Reactions  . Colchicine Anaphylaxis  . Clopidogrel    VITALS:  Blood pressure (!) 108/96, pulse 89, temperature 97.8 F (36.6 C), resp. rate 13, height 5\' 1"  (1.549 m), weight 147 lb 14.9 oz (67.1 kg), SpO2 100 %. PHYSICAL EXAMINATION:  Physical Exam  Constitutional: She is oriented to person, place, and time.  HENT:  Head: Normocephalic and atraumatic.  Eyes: Pupils are equal, round, and reactive to light. Conjunctivae and EOM are normal.  Neck: Normal range of motion. Neck supple. No tracheal deviation present. No thyromegaly present.  Cardiovascular: Normal rate,  regular rhythm and normal heart sounds.  Pulmonary/Chest: Effort normal and breath sounds normal. No respiratory distress. She has no wheezes. She exhibits no tenderness.  Abdominal: Soft. Bowel sounds are normal. She exhibits no distension. There is no tenderness.  Musculoskeletal: Normal range of motion.  Neurological: She is alert and oriented to person, place, and time. No cranial nerve deficit.  Skin: Skin is warm and dry. No rash noted.   LABORATORY PANEL:  Female CBC Recent Labs  Lab 05/05/18 0959  WBC 8.4  HGB 10.2*  HCT 31.6*  PLT 171   ------------------------------------------------------------------------------------------------------------------ Chemistries  Recent Labs  Lab 05/05/18 0500  NA 132*  K 4.9  CL 98  CO2 20*  GLUCOSE 161*  BUN 42*  CREATININE 6.17*  CALCIUM 8.8*  AST 551*  ALT 808*  ALKPHOS 130*  BILITOT 1.1   RADIOLOGY:  No results found. ASSESSMENT AND PLAN:  Nichole Hurst  is a 67 y.o. female with a known history of end-stage renal disease on hemodialysis got 3 L of fluid taken out and eventually patient was dizzy and passed out.  Patient has history of diabetes metas, coronary artery disease, COPD, hyperlipidemia and systolic congestive heart failure.  Patient is brought into the emergency department and she has received 750 cc of fluid bolus with no significant improvement in the blood pressure.  During my examination systolic blood pressure is at 76 with a diastolic at 51.  Map at around 55-60.    #syncope with severe hypotension Off norepinephrine. Started on midodrine.  - not sure if this is due to infection or too much fluid removal at HD Empiric ceftriaxone. Patient was on Midodrine, which was discontinued by  Dr. Wynelle LinkKolluru due to anxiety.  Monitor BP but no normal saline bolus per Dr. Wynelle LinkKolluru. Start florinef.   #Acute bronchitis, improved, cultures negative, discontinue V Rocephin.  #End-stage renal disease on hemodialysis  Continue  hemodialysis as scheduled.  #Elevated troponin but patient is denying any chest pain: due to demand ischemia Mildly elevated troponin in the setting of end-stage renal disease. The patient's chest pain is possibly due to hypotension and anxiety. Continue aspirin and Brilinta.  #Diabetes mellitus, on sliding scale insulin  #Chronic respiratory failure with COPD,  no exacerbation Nebulizer treatments  #Chronic systolic congestive heart failure with recent ejection fraction 30 to 35% Monitor fluid status with HD.  Repeated chest x-ray is unremarkable.  Abnormal liver function test, Possible due to shock liver. Abdominal ultrasound is unremarkable. liver function is better.  Follow-up liver function test.  Discussed with [facility] director Dr. Purcell Moutoneilly and her NP long time. All the records are reviewed and case discussed with Care Management/Social Worker. Management plans discussed with the patient, sister and they are in agreement.  CODE STATUS: Full Code  TOTAL TIME TAKING CARE OF THIS PATIENT: 46 minutes.   More than 50% of the time was spent in counseling/coordination of care: YES  POSSIBLE D/C IN 2 DAYS, DEPENDING ON CLINICAL CONDITION.   Shaune PollackQing Jonuel Hurst M.D on 05/05/2018 at 1:56 PM  Between 7am to 6pm - Pager - [phone]  After 6pm go to [email or web address] - Scientist, research (life sciences)password [email or web address]  [employer] [place] [facility]  Office  [phone]  CC: Primary care physician; Inc, [facility]  Note: This dictation was prepared with Nurse, children'sDragon dictation along with smaller Lobbyistphrase technology. Any transcriptional errors that result from this process are unintentional.

## 2018-05-05 NOTE — Progress Notes (Signed)
This note also relates to the following rows which could not be included: Pulse Rate - Cannot attach notes to unvalidated device data Resp - Cannot attach notes to unvalidated device data  Hd completed  

## 2018-05-06 ENCOUNTER — Inpatient Hospital Stay
Admit: 2018-05-06 | Discharge: 2018-05-06 | Disposition: A | Discharge status: Home / Self Care | Payer: Medicare (Managed Care) | Attending: Nephrology | Admitting: Nephrology

## 2018-05-06 LAB — GLUCOSE, CAPILLARY
GLUCOSE-CAPILLARY: 139 mg/dL — AB (ref 70–99)
Glucose-Capillary: 124 mg/dL — ABNORMAL HIGH (ref 70–99)

## 2018-05-06 LAB — HEPATITIS PANEL, ACUTE
HCV Ab: <0.1 s/co ratio (ref 0.0–0.9)
Hep A IgM: NEGATIVE
Hep B C IgM: NEGATIVE
Hepatitis B Surface Ag: NEGATIVE

## 2018-05-06 MED ORDER — FLUDROCORTISONE ACETATE 0.1 MG PO TABS: 0.1000 mg | ORAL_TABLET | Freq: Every day | ORAL | 0 refills | Status: AC

## 2018-05-06 NOTE — Care Management (Signed)
Spoke with [facility] they are ready to receive patient at this time. Discharge order in. Sister still in agreement to transport.

## 2018-05-06 NOTE — Progress Notes (Signed)
[facility]  ROUNDING NOTE   Subjective:   Sister at bedside.   Hemodialysis treatment yesterday. Tolerated treatment well. UF of 1 liter   Objective:  Vital signs in last 24 hours:  Temp:  [97.8 F (36.6 C)-98.8 F (37.1 C)] 97.9 F (36.6 C) (06/29 0540) Pulse Rate:  [64-101] 83 (06/29 0540) Resp:  [13-21] 13 (06/28 1315) BP: (70-108)/(31-96) 90/66 (06/29 0540) SpO2:  [92 %-98 %] 92 % (06/29 0830) Weight:  [66.2 kg (145 lb 15.1 oz)] 66.2 kg (145 lb 15.1 oz) (06/29 0540)  Weight change:  Filed Weights   05/03/18 1604 05/03/18 1958 05/06/18 0540  Weight: 68.4 kg (150 lb 12.7 oz) 67.1 kg (147 lb 14.9 oz) 66.2 kg (145 lb 15.1 oz)    Intake/Output: I/O last 3 completed shifts: In: -  Out: 1000 [Other:1000]   Intake/Output this shift:  No intake/output data recorded.  Physical Exam: General: NAD, Alert and Oriented.   Head: Normocephalic, atraumatic. Moist oral mucosal membranes  Eyes: Anicteric, PERRL  Neck: Supple, trachea midline  Lungs:  Clear to auscultation  Heart: Regular rate and rhythm  Abdomen:  Soft, nontender,   Extremities:  No peripheral edema.  Neurologic: Nonfocal, moving all four extremities  Skin: No lesions  Access: Left AVF    Basic Metabolic Panel: Recent Labs  Lab 05/01/18 1015 05/02/18 0602 05/03/18 0532 05/03/18 1634 05/05/18 0500  NA 134* 136 135 128* 132*  K 4.1 4.8 4.8 4.5 4.9  CL 92* 98 98 92* 98  CO2 27 20* 21* 21* 20*  GLUCOSE 241* 147* 100* 232* 161*  BUN 21* 33* 57* 63* 42*  CREATININE 4.47* 5.80* 7.22* 7.81* 6.17*  CALCIUM 8.9 8.5* 8.1* 8.1* 8.8*    Liver Function Tests: Recent Labs  Lab 05/01/18 1015 05/02/18 0602 05/03/18 0532 05/03/18 1634 05/05/18 0500  AST 18 2,610* 3,288* 2,131* 551*  ALT 12* 918* 1,437* 1,335* 808*  ALKPHOS 85 160* 136* 137* 130*  BILITOT 1.0 1.2 1.3* 1.0 1.1  PROT 7.7 6.8 6.1* 6.4* 6.0*  ALBUMIN 3.6 3.1* 2.9* 2.9* 2.9*   No results for input(s): LIPASE, AMYLASE in the  last 168 hours. No results for input(s): AMMONIA in the last 168 hours.  CBC: Recent Labs  Lab 05/01/18 1015 05/02/18 0602 05/03/18 0532 05/05/18 0959  WBC 8.9 14.3* 10.9 8.4  NEUTROABS 7.8*  --  8.7*  --   HGB 11.3* 11.8* 11.1* 10.2*  HCT 36.0 37.8 34.9* 31.6*  MCV 88.6 89.9 88.3 88.0  PLT 292 261 209 171    Cardiac Enzymes: Recent Labs  Lab 05/01/18 1015 05/01/18 1215 05/04/18 0757  CKTOTAL  --   --  91  TROPONINI 0.08* 0.06* 0.07*    BNP: Invalid input(s): POCBNP  CBG: Recent Labs  Lab 05/05/18 0743 05/05/18 1229 05/05/18 1650 05/05/18 2213 05/06/18 0743  GLUCAP 150* 140* 196* 265* 124*    Microbiology: Results for orders placed or performed during the hospital encounter of 05/01/18  MRSA PCR Screening     Status: Abnormal   Collection Time: 05/01/18  8:17 PM  Result Value Ref Range Status   MRSA by PCR POSITIVE (A) NEGATIVE Final    Comment:        The GeneXpert MRSA Assay (FDA approved for NASAL specimens only), is one component of a comprehensive MRSA colonization surveillance program. It is not intended to diagnose MRSA infection nor to guide or monitor treatment for MRSA infections. RESULT CALLED TO, READ BACK BY AND VERIFIED WITH:  DALE HOPKINS @2141  05/01/18 AKT Performed at [facility], [address] [place], [place] [address]   CULTURE, BLOOD (ROUTINE X 2) w Reflex to ID Panel     Status: None (Preliminary result)   Collection Time: 05/04/18  7:57 AM  Result Value Ref Range Status   Specimen Description BLOOD BLOOD RIGHT HAND  Final   Special Requests   Final    BOTTLES DRAWN AEROBIC AND ANAEROBIC Blood Culture adequate volume   Culture   Final    NO GROWTH 2 DAYS Performed at [facility], [address], [place], [place] [address]    Report Status PENDING  Incomplete    Coagulation Studies: No results for input(s): LABPROT, INR in the last 72 hours.  Urinalysis: No results for input(s): COLORURINE,  LABSPEC, PHURINE, GLUCOSEU, HGBUR, BILIRUBINUR, KETONESUR, PROTEINUR, UROBILINOGEN, NITRITE, LEUKOCYTESUR in the last 72 hours.  Invalid input(s): APPERANCEUR    Imaging: No results found.   Medications:    . aspirin EC  81 mg Oral QHS  . calcium acetate  1,334 mg Oral TID WC  . docusate sodium  100 mg Oral BID  . dorzolamide-timolol  1 drop Left Eye BID  . feeding supplement (NEPRO CARB STEADY)  237 mL Oral BID BM  . ferrous sulfate  325 mg Oral QHS  . fludrocortisone  0.1 mg Oral Daily  . heparin injection (subcutaneous)  5,000 Units Subcutaneous Q8H  . insulin aspart  0-5 Units Subcutaneous QHS  . insulin aspart  0-9 Units Subcutaneous TID WC  . insulin aspart  2 Units Subcutaneous TID WC  . ipratropium-albuterol  3 mL Nebulization TID  . latanoprost  1 drop Right Eye QHS  . loratadine  10 mg Oral Daily  . LORazepam  0.5 mg Oral QHS  . multivitamin  1 tablet Oral QHS  . mupirocin ointment  1 application Nasal BID  . ticagrelor  60 mg Oral BID   acetaminophen **OR** acetaminophen, guaiFENesin **AND** dextromethorphan, diphenhydrAMINE, hydrOXYzine, loperamide, nitroGLYCERIN, ondansetron **OR** ondansetron (ZOFRAN) IV, ondansetron, oxyCODONE  Assessment/ Plan:  Ms. Demara Lover is a 67 y.o. white  female with end stage renal disease on hemodialysis, hypertension, diabetes mellitus type II, anemia, CVA, pulmonary hypertension, peripheral vascular disease, hyperlipidemia, glaucoma, coronary artery disease, pacemaker placement, systolic congestive heart failure admitted on 6/24 for hypotension.   [facility] MWF [facility]   1. End Stage Renal Disease: MWF. Continue MWF schedule. Tolerating hemodialysis with ultrafiltration despite low blood pressures.   3. Hypotension: seems to not be tolerating midodrine.  -D/C Midodrine. Gives patient chest pain and anxiety   Florinef is not a good option in ESRD patients.  - Echocardiogram pending - blood cultures negative.    4. Anemia of chronic kidney disease: hemoglobin 10.2 - Mircera as outpatient.   5. Secondary Hyperparathyroidism:  - Calcium acetate with meals.   6. Shock Liver: from hypotensive episode. Transaminases trending downward.   LOS: 5 Jorgeluis Gurganus 6/29/201911:22 AM

## 2018-05-06 NOTE — Discharge Summary (Signed)
[facility] [place] [facility]   PATIENT NAME: Nichole Hurst    MR#:  [id number]  DATE OF BIRTH:  07/29/1951  DATE OF ADMISSION:  05/01/2018   ADMITTING PHYSICIAN: Ramonita Lab, MD  DATE OF DISCHARGE:  05/06/2018 PRIMARY CARE PHYSICIAN: [facility]   ADMISSION DIAGNOSIS:  RUQ pain [R10.11] Hypotension, unspecified hypotension type [I95.9] Hypotension [I95.9] DISCHARGE DIAGNOSIS:  Active Problems:   Hypotension   Elevated LFTs  SECONDARY DIAGNOSIS:   Past Medical History:  Diagnosis Date  . Anemia of chronic disease   . CAD (coronary artery disease)    a. s/p 2-V CABG 05/2016 (LIMA-LAD, VG-OM1)  . Carotid stenosis   . Charcot's gait    FOOT  . Claustrophobia   . Colon polyp   . Diabetes mellitus with complication (HCC)   . Emphysema/COPD (HCC)   . ESRD (end stage renal disease) on dialysis (HCC)    DIAYLISIS M/W/F  . Fractures    Lt foot  . GERD (gastroesophageal reflux disease)   . Glaucoma   . History of kidney stones   . Hyperlipidemia   . Hypertension   . Migraine   . Orthopnea   . Oxygen deficiency    AS NEEDED  . PAD (peripheral artery disease) (HCC)   . Pain    JOINTS  . Presence of permanent cardiac pacemaker    [employer]. [employer]   . Pulmonary hypertension (HCC)    a. TTE 10/18: EF 55-60%, no RWMA, Gr2DD, mild to moderate MR, mildly dilated left atrium, mildly dilated RV with normal RVSF, mildly dilated RA, severe TR, PASP 60-65 mmHg  . Retinopathy    DIABETIC  . Stroke [facility]) 09/2015   Ischemic stroke with R sided hemiparesis   HOSPITAL COURSE:  GloriaBarkeris a67 y.o.femalewith a known history of end-stage renal disease on hemodialysis got 3 L of fluid taken out and eventually patient was dizzy and passed out. Patient has history of diabetes metas, coronary artery disease, COPD, hyperlipidemia and systolic congestive heart failure. Patient is brought into the emergency department and she has received 750 cc of  fluid bolus with no significant improvement in the blood pressure. During my examination systolic blood pressure was at 76 with a diastolic at 51. Map at around 55-60.  #syncope with severe hypotension She was on norepinephrine. Started on midodrine. - not sure if this is due to infection or too much fluid removal at HD She was on empiric ceftriaxone. Patient was on Midodrine, which was discontinued by Dr. Wynelle Link due to anxiety and itching. Started florinef. Discontinued all HTN medications.  #Acute bronchitis, improved, cultures negative, discontinued V Rocephin.  #End-stage renal disease on hemodialysis  Continue hemodialysis as scheduled.  #Elevated troponin but patient is denying any chest pain: due to demand ischemia Mildly elevated troponin in the setting of end-stage renal disease. The patient's chest pain is possibly due to hypotension and anxiety. Continue aspirin and Brilinta.  #Diabetes mellitus, on sliding scale insulin  #Chronic respiratory failure with COPD,  no exacerbation Nebulizer treatments  #Chronic systolic congestive heart failure with recent ejection fraction 30 to 35% Monitor fluid status with HD.  Repeated chest x-ray is unremarkable.  Abnormal liver function test, due to shock liver. Abdominal ultrasound is unremarkable. liver function is better.  Follow-up liver function test as outpatient. DISCHARGE CONDITIONS:  Stable, discharge to home. CONSULTS OBTAINED:  Treatment Team:  Lamont Dowdy, MD Mertie Moores, MD DRUG ALLERGIES:   Allergies  Allergen Reactions  .  Colchicine Anaphylaxis  . Clopidogrel    DISCHARGE MEDICATIONS:   Allergies as of 05/06/2018      Reactions   Colchicine Anaphylaxis   Clopidogrel       Medication List    STOP taking these medications   irbesartan 150 MG tablet Commonly known as:  AVAPRO   isosorbide mononitrate 30 MG 24 hr tablet Commonly known as:  IMDUR   losartan 50 MG tablet Commonly  known as:  COZAAR   metoprolol succinate 50 MG 24 hr tablet Commonly known as:  TOPROL-XL   predniSONE 10 MG tablet Commonly known as:  DELTASONE   torsemide 100 MG tablet Commonly known as:  DEMADEX     TAKE these medications   aspirin EC 81 MG tablet Take 81 mg by mouth at bedtime.   atorvastatin 80 MG tablet Commonly known as:  LIPITOR Take 80 mg by mouth every evening.   calcium acetate 667 MG capsule Commonly known as:  PHOSLO Take 1,334 mg by mouth 3 (three) times daily with meals.   camphor-menthol lotion Commonly known as:  SARNA Apply 1 application topically 3 (three) times daily as needed for itching.   cholecalciferol 1000 units tablet Commonly known as:  VITAMIN D Take 1,000 Units by mouth daily.   dextromethorphan-guaiFENesin 30-600 MG 12hr tablet Commonly known as:  MUCINEX DM Take 1 tablet by mouth 2 (two) times daily as needed for cough.   diphenhydrAMINE 25 mg capsule Commonly known as:  BENADRYL Take 25 mg by mouth every 4 (four) hours as needed for itching.   dorzolamide-timolol 22.3-6.8 MG/ML ophthalmic solution Commonly known as:  COSOPT Place 1 drop into the left eye 2 (two) times daily.   Ferrous Sulfate 140 (45 Fe) MG Tbcr Take 1 tablet by mouth at bedtime.   fludrocortisone 0.1 MG tablet Commonly known as:  FLORINEF Take 1 tablet (0.1 mg total) by mouth daily.   hydrOXYzine 25 MG tablet Commonly known as:  ATARAX/VISTARIL Take 12.5 mg by mouth daily as needed for anxiety.   Ipratropium-Albuterol 20-100 MCG/ACT Aers respimat Commonly known as:  COMBIVENT Inhale 2 puffs into the lungs every 6 (six) hours as needed for wheezing or shortness of breath.   ipratropium-albuterol 0.5-2.5 (3) MG/3ML Soln Commonly known as:  DUONEB Take 3 mLs by nebulization. Every 4 to 6 hours as needed for shortness of breath   latanoprost 0.005 % ophthalmic solution Commonly known as:  XALATAN Place 1 drop into the right eye at bedtime.   loperamide  2 MG capsule Commonly known as:  IMODIUM Take 2 mg by mouth 4 (four) times daily as needed for diarrhea or loose stools.   loratadine 10 MG tablet Commonly known as:  CLARITIN Take 10 mg by mouth daily.   nitroGLYCERIN 0.4 MG SL tablet Commonly known as:  NITROSTAT Place 0.4 mg under the tongue every 5 (five) minutes as needed for chest pain.   ondansetron 4 MG tablet Commonly known as:  ZOFRAN Take 4 mg by mouth every 8 (eight) hours as needed for nausea or vomiting.   ticagrelor 60 MG Tabs tablet Commonly known as:  BRILINTA Take 60 mg by mouth 2 (two) times daily.        DISCHARGE INSTRUCTIONS:  See AVS.  If you experience worsening of your admission symptoms, develop shortness of breath, life threatening emergency, suicidal or homicidal thoughts you must seek medical attention immediately by calling 911 or calling your MD immediately  if symptoms less severe.  You Must  read complete instructions/literature along with all the possible adverse reactions/side effects for all the Medicines you take and that have been prescribed to you. Take any new Medicines after you have completely understood and accpet all the possible adverse reactions/side effects.   Please note  You were cared for by a hospitalist during your hospital stay. If you have any questions about your discharge medications or the care you received while you were in the hospital after you are discharged, you can call the unit and asked to speak with the hospitalist on call if the hospitalist that took care of you is not available. Once you are discharged, your primary care physician will handle any further medical issues. Please note that NO REFILLS for any discharge medications will be authorized once you are discharged, as it is imperative that you return to your primary care physician (or establish a relationship with a primary care physician if you do not have one) for your aftercare needs so that they can reassess  your need for medications and monitor your lab values.    On the day of Discharge:  VITAL SIGNS:  Blood pressure 90/66, pulse 83, temperature 97.9 F (36.6 C), temperature source Oral, resp. rate 13, height 5\' 1"  (1.549 m), weight 145 lb 15.1 oz (66.2 kg), SpO2 92 %. PHYSICAL EXAMINATION:  GENERAL:  67 y.o.-year-old patient lying in the bed with no acute distress.  EYES: Pupils equal, round, reactive to light and accommodation. No scleral icterus. Extraocular muscles intact.  HEENT: Head atraumatic, normocephalic. Oropharynx and nasopharynx clear.  NECK:  Supple, no jugular venous distention. No thyroid enlargement, no tenderness.  LUNGS: Normal breath sounds bilaterally, no wheezing, rales,rhonchi or crepitation. No use of accessory muscles of respiration.  CARDIOVASCULAR: S1, S2 normal. No murmurs, rubs, or gallops.  ABDOMEN: Soft, non-tender, non-distended. Bowel sounds present. No organomegaly or mass.  EXTREMITIES: No pedal edema, cyanosis, or clubbing.  NEUROLOGIC: Cranial nerves II through XII are intact. Muscle strength 5/5 in all extremities. Sensation intact. Gait not checked.  PSYCHIATRIC: The patient is alert and oriented x 3.  SKIN: No obvious rash, lesion, or ulcer.  DATA REVIEW:   CBC Recent Labs  Lab 05/05/18 0959  WBC 8.4  HGB 10.2*  HCT 31.6*  PLT 171    Chemistries  Recent Labs  Lab 05/05/18 0500  NA 132*  K 4.9  CL 98  CO2 20*  GLUCOSE 161*  BUN 42*  CREATININE 6.17*  CALCIUM 8.8*  AST 551*  ALT 808*  ALKPHOS 130*  BILITOT 1.1     Microbiology Results  Results for orders placed or performed during the hospital encounter of 05/01/18  MRSA PCR Screening     Status: Abnormal   Collection Time: 05/01/18  8:17 PM  Result Value Ref Range Status   MRSA by PCR POSITIVE (A) NEGATIVE Final    Comment:        The GeneXpert MRSA Assay (FDA approved for NASAL specimens only), is one component of a comprehensive MRSA colonization surveillance  program. It is not intended to diagnose MRSA infection nor to guide or monitor treatment for MRSA infections. RESULT CALLED TO, READ BACK BY AND VERIFIED WITH: DALE HOPKINS @2141  05/01/18 AKT Performed at [facility], [address] [place], [place] [address]   CULTURE, BLOOD (ROUTINE X 2) w Reflex to ID Panel     Status: None (Preliminary result)   Collection Time: 05/04/18  7:57 AM  Result Value Ref Range Status   Specimen  Description BLOOD BLOOD RIGHT HAND  Final   Special Requests   Final    BOTTLES DRAWN AEROBIC AND ANAEROBIC Blood Culture adequate volume   Culture   Final    NO GROWTH 2 DAYS Performed at [facility], [address] [place], [place] [address]    Report Status PENDING  Incomplete    RADIOLOGY:  No results found.   Management plans discussed with the patient, family and they are in agreement.  CODE STATUS: Full Code   TOTAL TIME TAKING CARE OF THIS PATIENT: 33 minutes.    Shaune PollackQing Martena Emanuele M.D on 05/06/2018 at 9:58 AM  Between 7am to 6pm - Pager - [phone]  After 6pm go to [email or web address] - Scientist, research (life sciences)password [email or web address]  [facility]  Office  [phone]  CC: Primary care physician; [facility]   Note: This dictation was prepared with Nurse, children'sDragon dictation along with smaller Lobbyistphrase technology. Any transcriptional errors that result from this process are unintentional.

## 2018-05-06 NOTE — Progress Notes (Signed)
Patient cleared for discharge to home       Education complete. AVS printed. Discharge instructions given. All questions answered for patient clarification.  Prescriptions given, pharmacy verified.  IV removed.  Discharged to home with [facility] POV

## 2018-05-07 LAB — ECHOCARDIOGRAM COMPLETE
Height: 61 in
WEIGHTICAEL: 2335.11 [oz_av]

## 2018-05-09 LAB — CULTURE, BLOOD (ROUTINE X 2)
Culture: NO GROWTH
Special Requests: ADEQUATE

## 2018-06-08 DEATH — deceased

## 2018-08-07 IMAGING — DX DG FOOT 2V*L*
2 series · 2 of 2 positions shown · non-contrast
Comparison: None.

CLINICAL DATA: LEFT foot pain.  Gangrene.

EXAM:
LEFT FOOT - 2 VIEW

[foot ap]
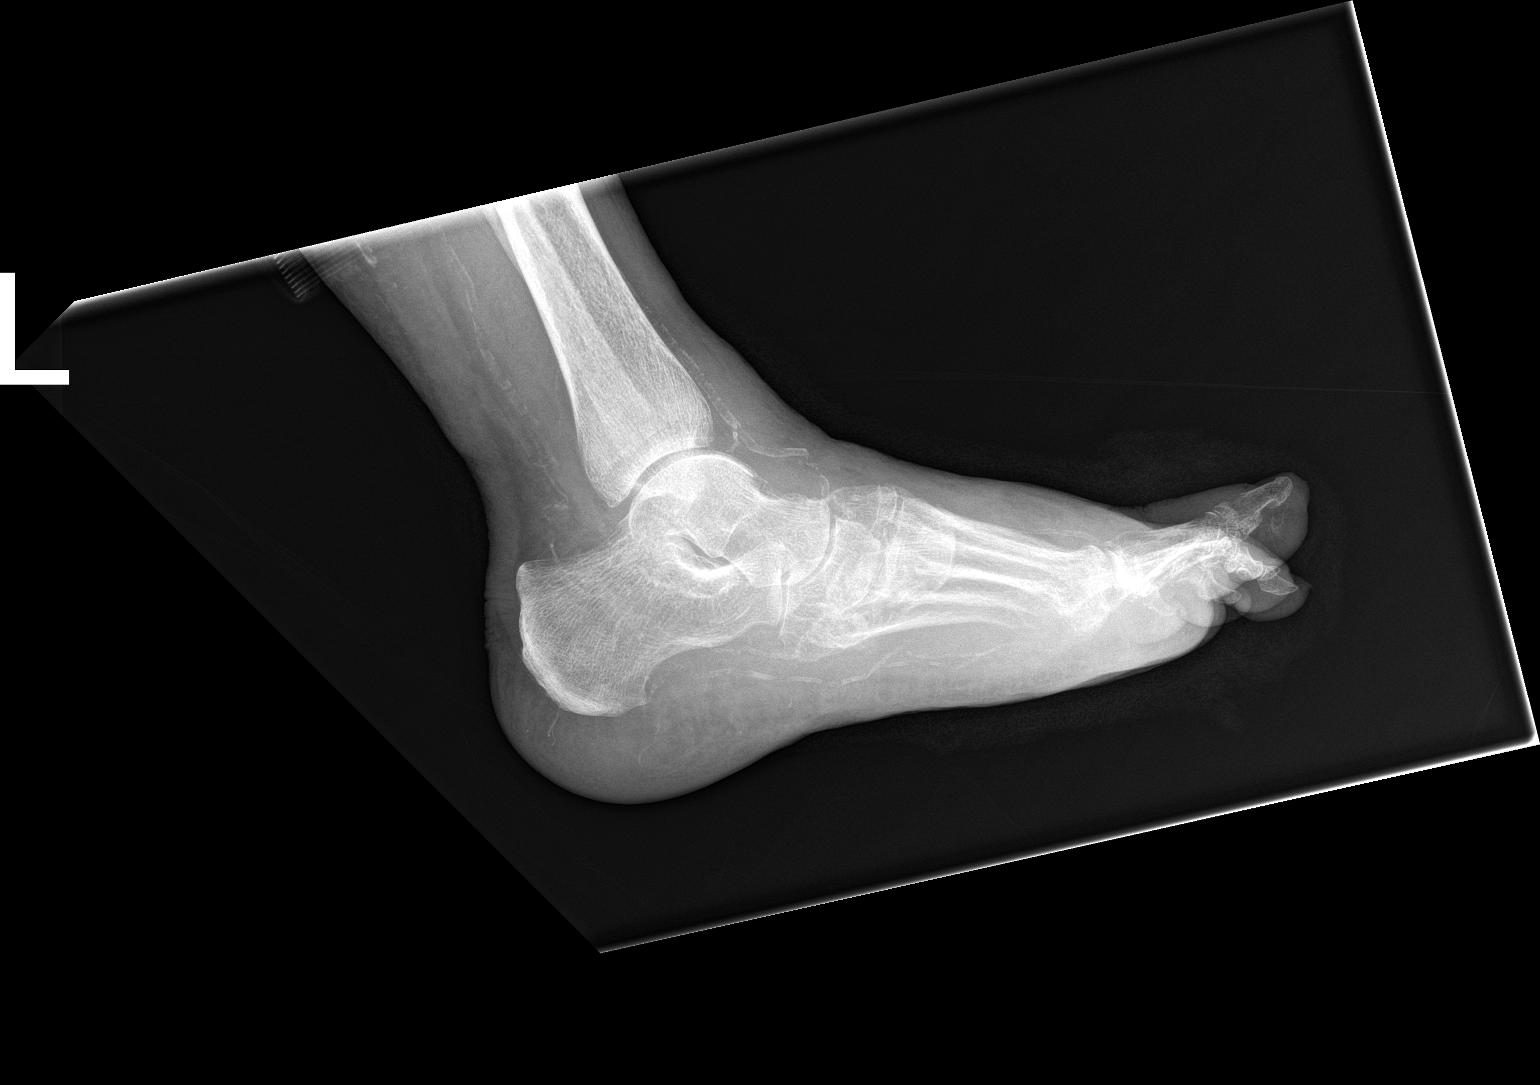

[foot lat]
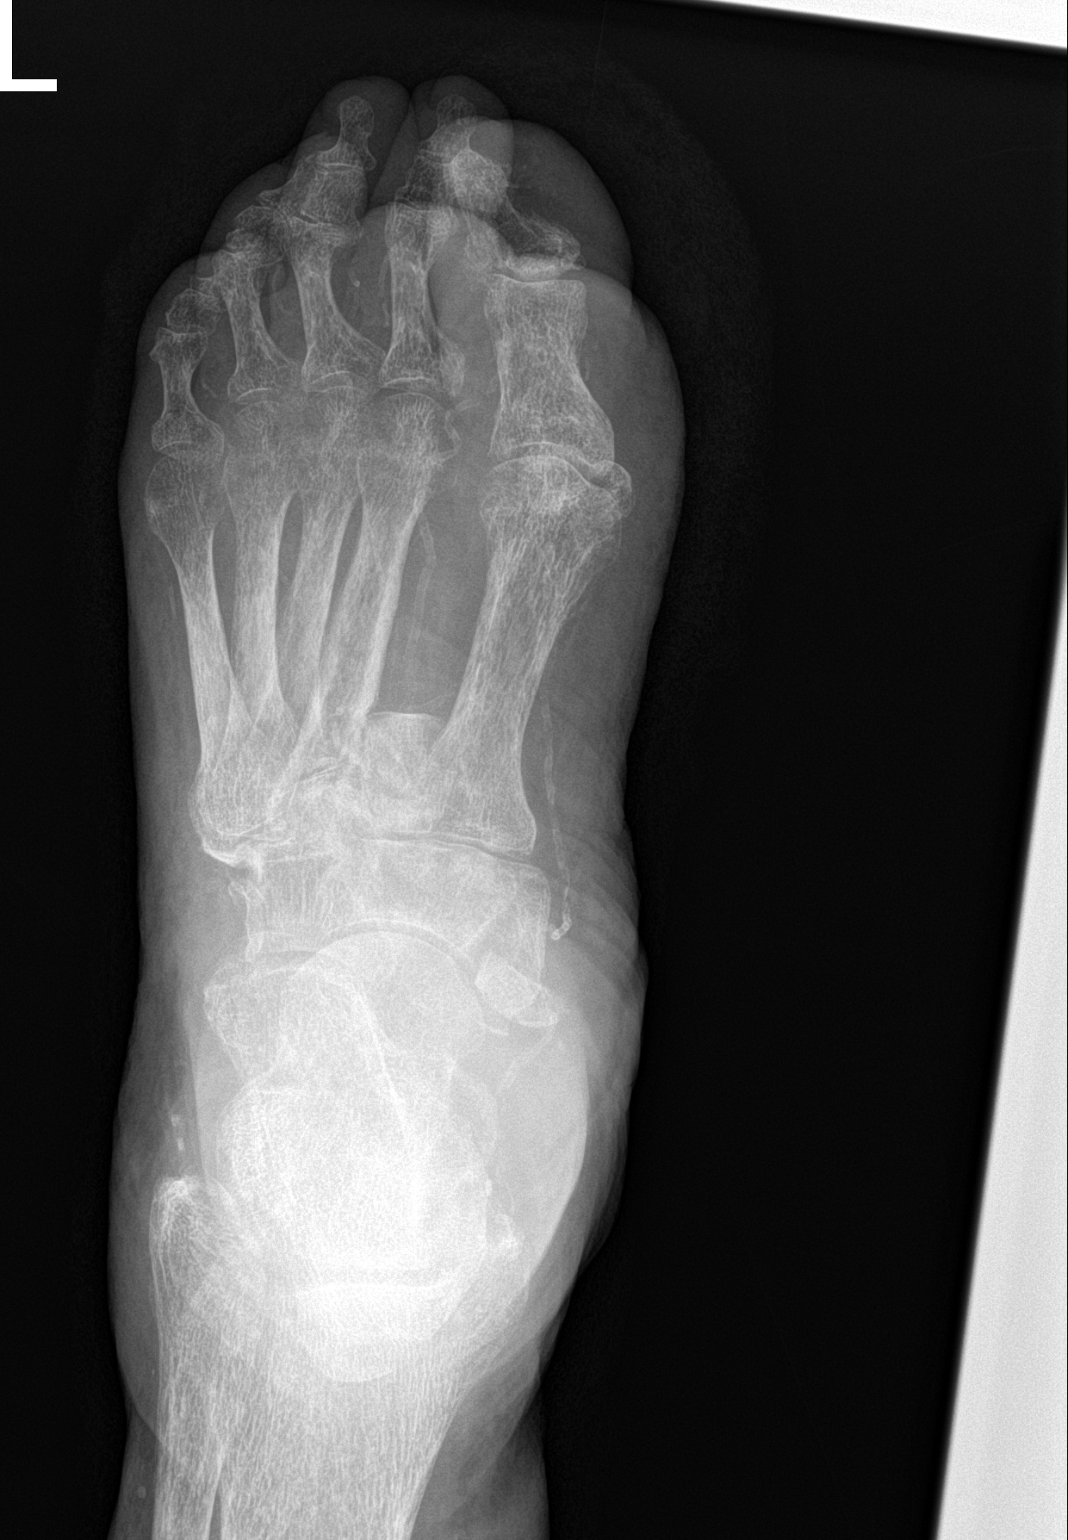

[2 of 2 positions shown; findings below may reference images not displayed]

FINDINGS: Lucency dorsum of the first distal phalanx, limited assessment on AP
view due to patient positioning.

Splayed first and second metatarsal with corticated bony fragment
interposed contiguous with midfoot. A second bony fragment projects
within the medial midfoot without donor site. Osteopenia. Severe
vascular calcifications. Dorsal and forefoot soft tissue swelling
without subcutaneous gas or radiopaque foreign bodies.
IMPRESSION: Focal lucency dorsum first distal phalanx most consistent with focal
osteomyelitis.

Old Lisfranc injury with corticated interposed bone fragment most
consistent with old nonunited cuneiform fragment migration.

## 2018-08-08 ENCOUNTER — Ambulatory Visit (INDEPENDENT_AMBULATORY_CARE_PROVIDER_SITE_OTHER): Payer: Medicare (Managed Care) | Admitting: Vascular Surgery

## 2018-08-08 ENCOUNTER — Encounter (INDEPENDENT_AMBULATORY_CARE_PROVIDER_SITE_OTHER): Payer: Medicare (Managed Care)

## 2020-04-01 IMAGING — US US ABDOMEN LIMITED
1 series · 14 of 25 positions shown · non-contrast
Comparison: Ultrasound dated 11/10/2017 and abdominal CT scan dated
01/01/2016

CLINICAL DATA: Right upper quadrant pain. Nausea and vomiting.
Cholecystectomy.

EXAM:
ULTRASOUND ABDOMEN LIMITED RIGHT UPPER QUADRANT

[Series 1: us abdomen limited · 0.28mm/px · 14 of 28 slices shown]
[im 1/28]
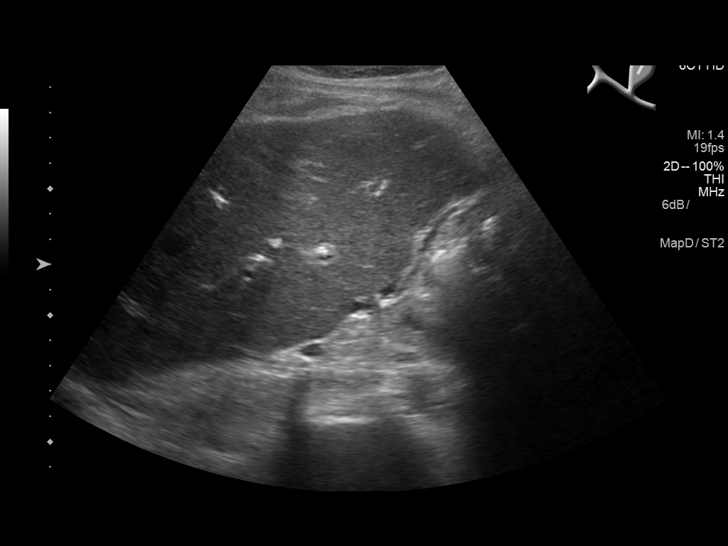
[im 3/28]
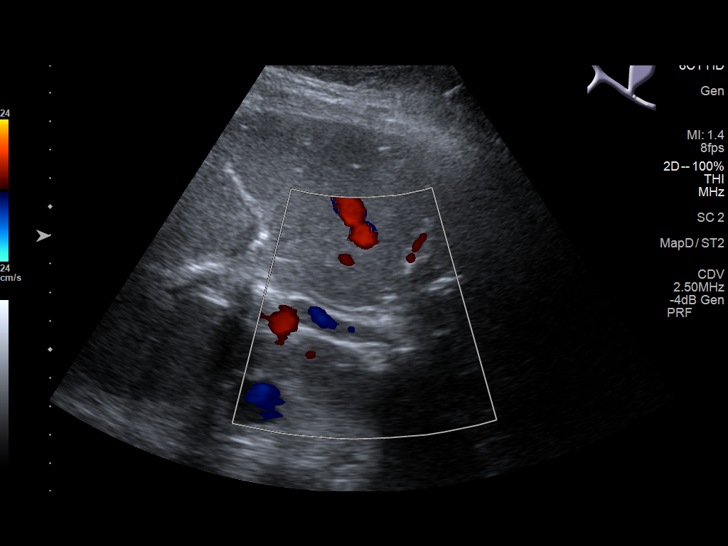
[im 5/28]
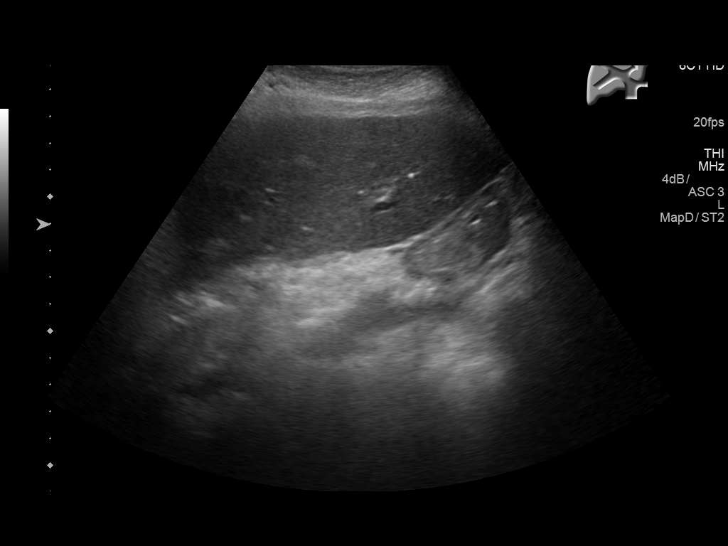
[im 7/28]
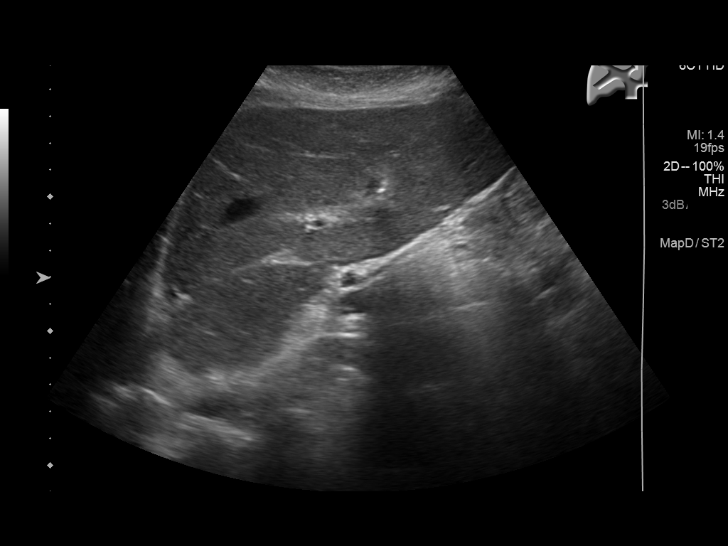
[im 10/28]
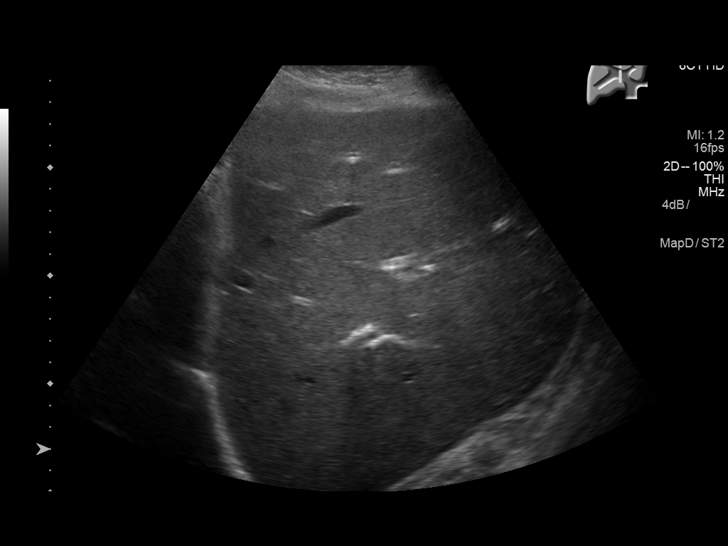
[im 11/28]
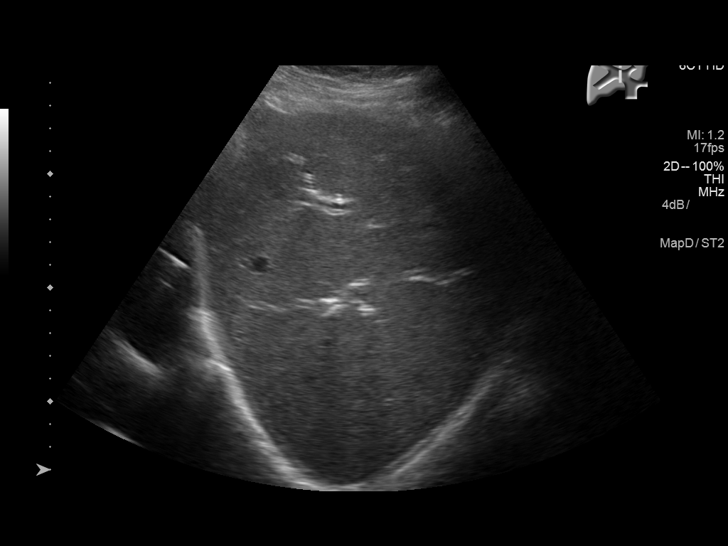
[im 13/28]
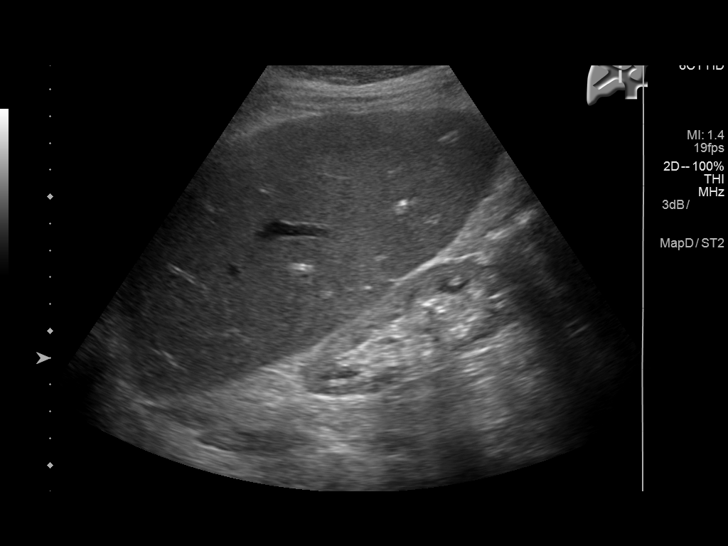
[im 15/28]
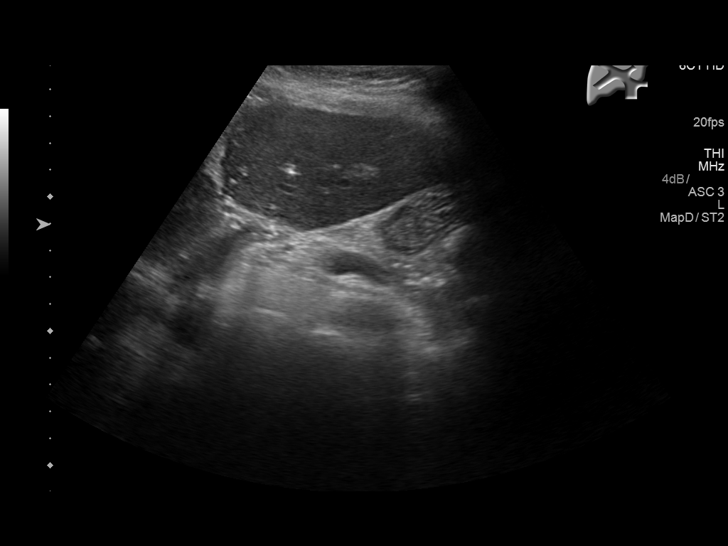
[im 17/28]
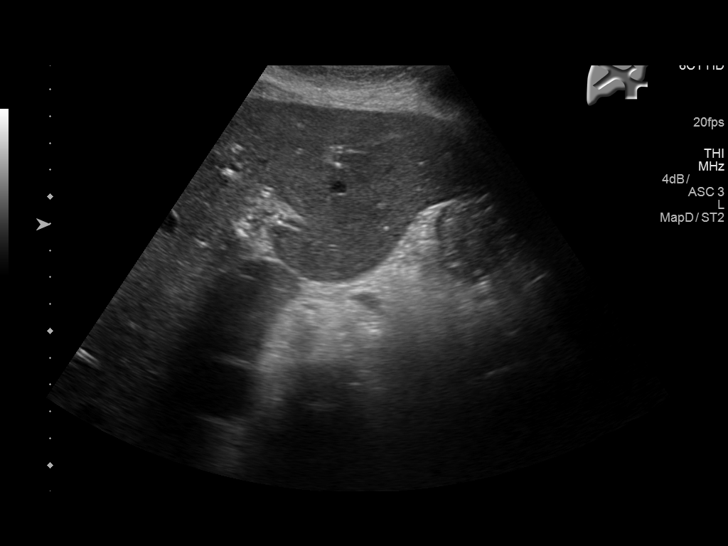
[im 19/28]
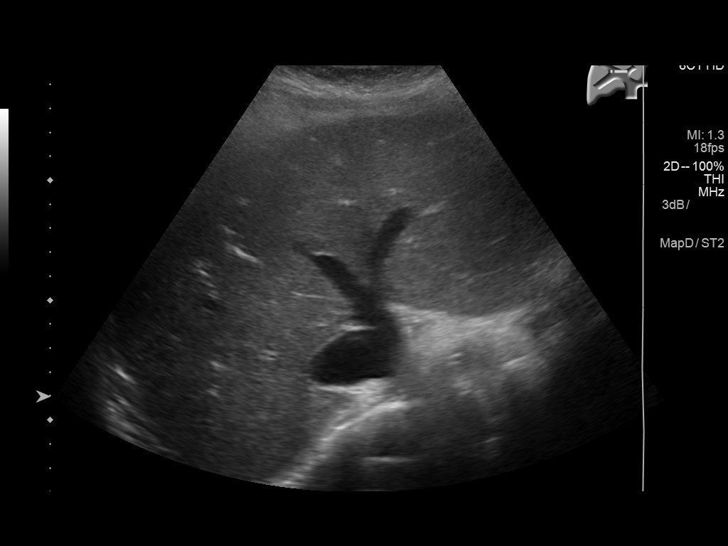
[im 21/28]
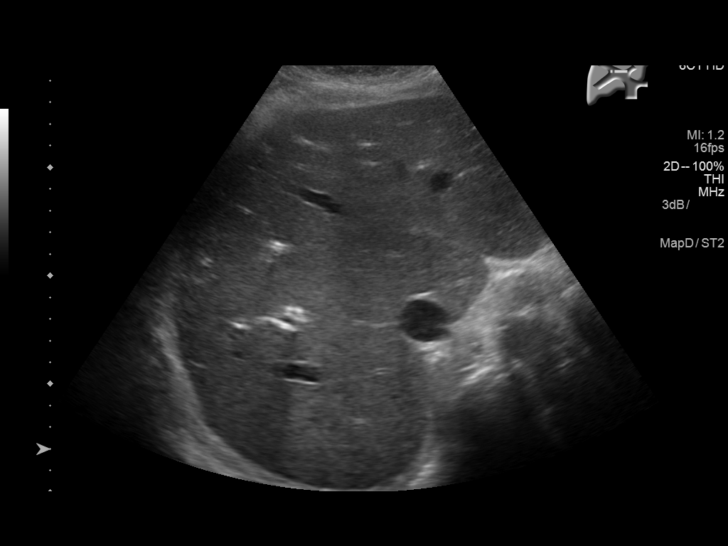
[im 23/28]
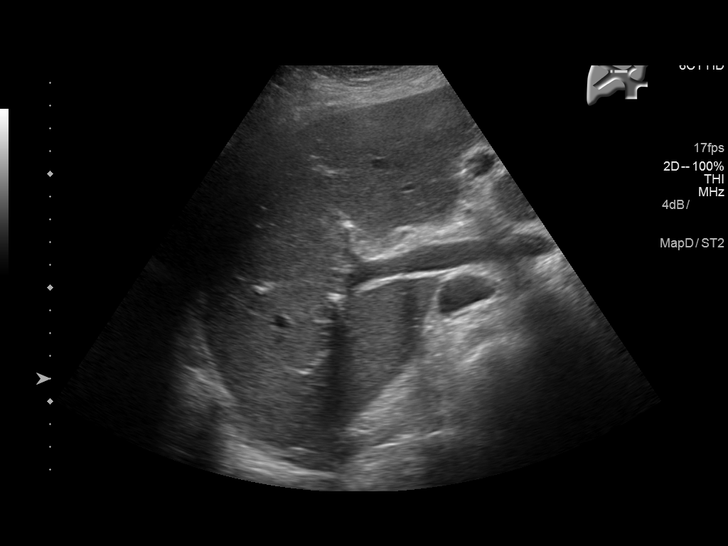
[im 25/28]
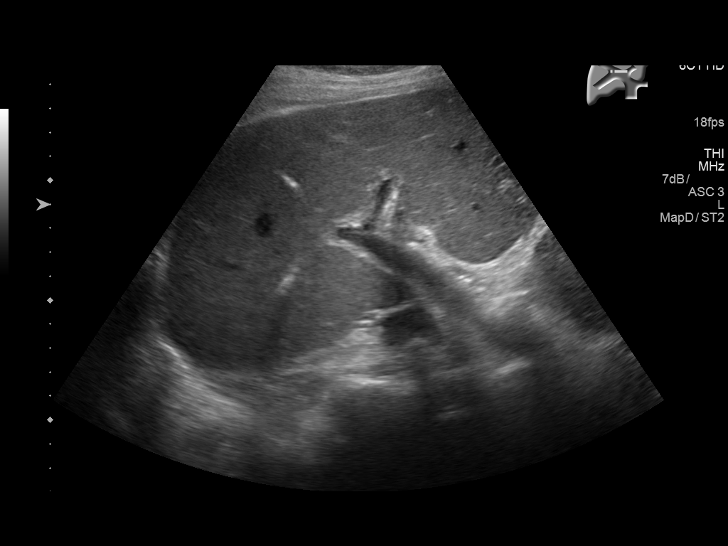
[im 28/28]
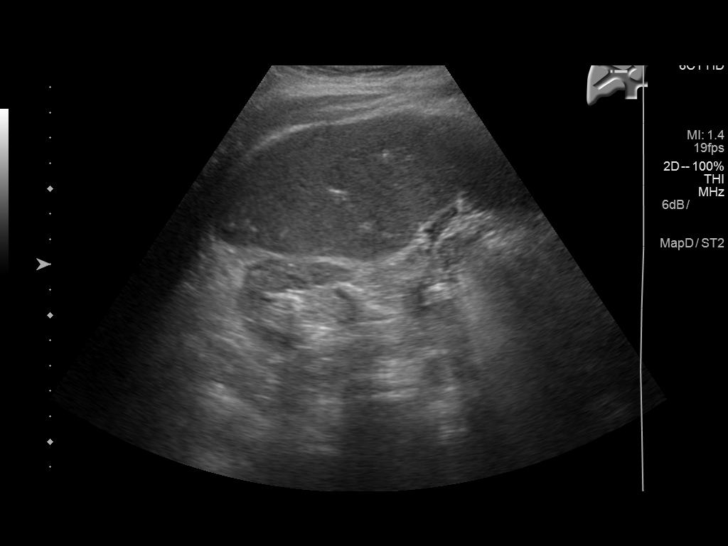

[14 of 25 positions shown; findings below may reference images not displayed]

FINDINGS: Gallbladder:

Gallbladder has been removed.  Negative sonographic Murphy's sign.

Common bile duct:

Diameter: 1.8 mm, normal.

Liver:

Echogenic foci in the liver consistent with vascular calcifications
demonstrated on the prior exam and on the prior CT scan. No mass
lesions. Portal vein is patent on color Doppler imaging with normal
direction of blood flow towards the liver.
IMPRESSION: No significant abnormality.  No change since the prior study.
